# Patient Record
Sex: Female | Born: 1935 | Race: White | Hispanic: No | Smoking: Never smoker
Health system: Southern US, Community
[De-identification: ages and names within clinical notes are randomized; demographics above are authoritative.]

## PROBLEM LIST (undated history)

## (undated) DIAGNOSIS — R112 Nausea with vomiting, unspecified: Secondary | ICD-10-CM

## (undated) DIAGNOSIS — I471 Supraventricular tachycardia, unspecified: Secondary | ICD-10-CM

## (undated) DIAGNOSIS — E7800 Pure hypercholesterolemia, unspecified: Secondary | ICD-10-CM

## (undated) DIAGNOSIS — M545 Low back pain, unspecified: Secondary | ICD-10-CM

## (undated) DIAGNOSIS — K5731 Diverticulosis of large intestine without perforation or abscess with bleeding: Secondary | ICD-10-CM

## (undated) DIAGNOSIS — I10 Essential (primary) hypertension: Secondary | ICD-10-CM

## (undated) DIAGNOSIS — M5137 Other intervertebral disc degeneration, lumbosacral region: Secondary | ICD-10-CM

## (undated) DIAGNOSIS — G9389 Other specified disorders of brain: Secondary | ICD-10-CM

## (undated) DIAGNOSIS — I499 Cardiac arrhythmia, unspecified: Secondary | ICD-10-CM

## (undated) DIAGNOSIS — O021 Missed abortion: Secondary | ICD-10-CM

## (undated) DIAGNOSIS — Z9889 Other specified postprocedural states: Secondary | ICD-10-CM

## (undated) DIAGNOSIS — M51379 Other intervertebral disc degeneration, lumbosacral region without mention of lumbar back pain or lower extremity pain: Secondary | ICD-10-CM

## (undated) DIAGNOSIS — K5900 Constipation, unspecified: Secondary | ICD-10-CM

## (undated) DIAGNOSIS — H409 Unspecified glaucoma: Secondary | ICD-10-CM

## (undated) DIAGNOSIS — R197 Diarrhea, unspecified: Secondary | ICD-10-CM

## (undated) DIAGNOSIS — R51 Headache: Secondary | ICD-10-CM

## (undated) DIAGNOSIS — C801 Malignant (primary) neoplasm, unspecified: Secondary | ICD-10-CM

## (undated) DIAGNOSIS — R519 Headache, unspecified: Secondary | ICD-10-CM

## (undated) DIAGNOSIS — N393 Stress incontinence (female) (male): Secondary | ICD-10-CM

## (undated) DIAGNOSIS — M1990 Unspecified osteoarthritis, unspecified site: Secondary | ICD-10-CM

## (undated) HISTORY — DX: Supraventricular tachycardia, unspecified: I47.10

## (undated) HISTORY — PX: EYE SURGERY: SHX253

## (undated) HISTORY — DX: Low back pain, unspecified: M54.50

## (undated) HISTORY — PX: MULTIPLE TOOTH EXTRACTIONS: SHX2053

## (undated) HISTORY — DX: Unspecified glaucoma: H40.9

## (undated) HISTORY — PX: APPENDECTOMY: SHX54

## (undated) HISTORY — DX: Other intervertebral disc degeneration, lumbosacral region without mention of lumbar back pain or lower extremity pain: M51.379

## (undated) HISTORY — DX: Pure hypercholesterolemia, unspecified: E78.00

## (undated) HISTORY — PX: OTHER SURGICAL HISTORY: SHX169

## (undated) HISTORY — DX: Diverticulosis of large intestine without perforation or abscess with bleeding: K57.31

## (undated) HISTORY — PX: TUBAL LIGATION: SHX77

## (undated) HISTORY — DX: Stress incontinence (female) (male): N39.3

## (undated) HISTORY — DX: Low back pain: M54.5

## (undated) HISTORY — DX: Supraventricular tachycardia: I47.1

## (undated) HISTORY — DX: Unspecified osteoarthritis, unspecified site: M19.90

## (undated) HISTORY — PX: ABDOMINAL HYSTERECTOMY: SHX81

## (undated) HISTORY — DX: Other intervertebral disc degeneration, lumbosacral region: M51.37

## (undated) HISTORY — PX: COLONOSCOPY: SHX174

## (undated) HISTORY — DX: Essential (primary) hypertension: I10

## (undated) SURGERY — Surgical Case
Anesthesia: *Unknown

---

## 1999-06-10 ENCOUNTER — Encounter: Admission: RE | Admit: 1999-06-10 | Discharge: 1999-06-10 | Payer: Self-pay | Admitting: Internal Medicine

## 1999-06-10 ENCOUNTER — Encounter: Payer: Self-pay | Admitting: Internal Medicine

## 1999-11-27 ENCOUNTER — Encounter: Payer: Self-pay | Admitting: Internal Medicine

## 1999-11-27 ENCOUNTER — Inpatient Hospital Stay (HOSPITAL_COMMUNITY): Admission: AD | Admit: 1999-11-27 | Discharge: 1999-11-30 | Payer: Self-pay | Admitting: Internal Medicine

## 2004-05-07 ENCOUNTER — Ambulatory Visit: Payer: Self-pay | Admitting: Internal Medicine

## 2004-05-12 ENCOUNTER — Encounter: Admission: RE | Admit: 2004-05-12 | Discharge: 2004-05-12 | Payer: Self-pay | Admitting: Internal Medicine

## 2004-06-16 ENCOUNTER — Ambulatory Visit: Payer: Self-pay | Admitting: Internal Medicine

## 2004-07-01 ENCOUNTER — Ambulatory Visit: Payer: Self-pay | Admitting: Internal Medicine

## 2005-05-27 ENCOUNTER — Ambulatory Visit: Payer: Self-pay | Admitting: Internal Medicine

## 2005-05-28 ENCOUNTER — Ambulatory Visit: Payer: Self-pay | Admitting: Internal Medicine

## 2005-06-10 ENCOUNTER — Ambulatory Visit: Payer: Self-pay | Admitting: Internal Medicine

## 2005-11-19 ENCOUNTER — Ambulatory Visit: Payer: Self-pay | Admitting: Internal Medicine

## 2005-11-25 ENCOUNTER — Ambulatory Visit: Payer: Self-pay | Admitting: Internal Medicine

## 2005-12-01 ENCOUNTER — Ambulatory Visit: Payer: Self-pay | Admitting: Family Medicine

## 2005-12-01 DIAGNOSIS — K5731 Diverticulosis of large intestine without perforation or abscess with bleeding: Secondary | ICD-10-CM | POA: Insufficient documentation

## 2005-12-02 ENCOUNTER — Ambulatory Visit: Payer: Self-pay | Admitting: Family Medicine

## 2005-12-03 ENCOUNTER — Ambulatory Visit: Payer: Self-pay | Admitting: Family Medicine

## 2005-12-07 ENCOUNTER — Ambulatory Visit: Payer: Self-pay | Admitting: Family Medicine

## 2005-12-15 ENCOUNTER — Ambulatory Visit: Payer: Self-pay | Admitting: Internal Medicine

## 2006-01-06 ENCOUNTER — Ambulatory Visit: Payer: Self-pay | Admitting: Family Medicine

## 2006-04-21 DIAGNOSIS — N393 Stress incontinence (female) (male): Secondary | ICD-10-CM | POA: Insufficient documentation

## 2006-04-21 DIAGNOSIS — M545 Low back pain, unspecified: Secondary | ICD-10-CM | POA: Insufficient documentation

## 2006-04-21 DIAGNOSIS — H409 Unspecified glaucoma: Secondary | ICD-10-CM | POA: Insufficient documentation

## 2006-04-21 DIAGNOSIS — Z8711 Personal history of peptic ulcer disease: Secondary | ICD-10-CM

## 2006-04-21 DIAGNOSIS — I471 Supraventricular tachycardia, unspecified: Secondary | ICD-10-CM | POA: Insufficient documentation

## 2006-04-21 DIAGNOSIS — M51379 Other intervertebral disc degeneration, lumbosacral region without mention of lumbar back pain or lower extremity pain: Secondary | ICD-10-CM | POA: Insufficient documentation

## 2006-04-21 DIAGNOSIS — M5137 Other intervertebral disc degeneration, lumbosacral region: Secondary | ICD-10-CM

## 2006-04-27 ENCOUNTER — Ambulatory Visit: Payer: Self-pay | Admitting: Family Medicine

## 2006-04-29 ENCOUNTER — Ambulatory Visit: Payer: Self-pay | Admitting: *Deleted

## 2006-05-07 ENCOUNTER — Ambulatory Visit: Payer: Self-pay | Admitting: Family Medicine

## 2006-05-07 LAB — CONVERTED CEMR LAB
Albumin: 3.8 g/dL (ref 3.5–5.2)
Alkaline Phosphatase: 54 units/L (ref 39–117)
Direct LDL: 156.7 mg/dL
HDL: 39.7 mg/dL (ref >39.0)
Total CHOL/HDL Ratio: 5.7
Triglycerides: 164 mg/dL — ABNORMAL HIGH (ref 0–149)
VLDL: 33 mg/dL (ref 0–40)

## 2006-08-20 ENCOUNTER — Ambulatory Visit: Payer: Self-pay | Admitting: Family Medicine

## 2006-08-20 DIAGNOSIS — E7800 Pure hypercholesterolemia, unspecified: Secondary | ICD-10-CM | POA: Insufficient documentation

## 2006-09-01 LAB — CONVERTED CEMR LAB
AST: 22 units/L (ref 0–37)
Cholesterol: 155 mg/dL (ref 0–200)
HDL: 45.3 mg/dL (ref >39.0)
LDL Cholesterol: 87 mg/dL (ref 0–99)
Total CHOL/HDL Ratio: 3.4
Triglycerides: 113 mg/dL (ref 0–149)

## 2007-01-14 ENCOUNTER — Ambulatory Visit: Payer: Self-pay | Admitting: Family Medicine

## 2007-03-09 ENCOUNTER — Ambulatory Visit: Payer: Self-pay | Admitting: Family Medicine

## 2007-03-15 LAB — CONVERTED CEMR LAB
Albumin: 3.9 g/dL (ref 3.5–5.2)
Alkaline Phosphatase: 57 units/L (ref 39–117)
LDL Cholesterol: 109 mg/dL — ABNORMAL HIGH (ref 0–99)
Total Bilirubin: 0.9 mg/dL (ref 0.3–1.2)
Total CHOL/HDL Ratio: 4.1
Triglycerides: 136 mg/dL (ref 0–149)

## 2007-04-06 ENCOUNTER — Telehealth: Payer: Self-pay | Admitting: Family Medicine

## 2007-05-26 ENCOUNTER — Ambulatory Visit: Payer: Self-pay | Admitting: Family Medicine

## 2007-05-26 DIAGNOSIS — Z8719 Personal history of other diseases of the digestive system: Secondary | ICD-10-CM

## 2007-09-13 ENCOUNTER — Ambulatory Visit: Payer: Self-pay | Admitting: Family Medicine

## 2007-09-15 LAB — CONVERTED CEMR LAB
ALT: 17 units/L (ref 0–35)
AST: 19 units/L (ref 0–37)
Albumin: 4.1 g/dL (ref 3.5–5.2)
BUN: 12 mg/dL (ref 6–23)
CO2: 29 meq/L (ref 19–32)
Calcium: 9.3 mg/dL (ref 8.4–10.5)
Chloride: 105 meq/L (ref 96–112)
Cholesterol: 159 mg/dL (ref 0–200)
Creatinine, Ser: 0.8 mg/dL (ref 0.4–1.2)
GFR calc non Af Amer: 75 mL/min
HDL: 42 mg/dL (ref >39.0)
LDL Cholesterol: 99 mg/dL (ref 0–99)
Triglycerides: 92 mg/dL (ref 0–149)

## 2007-12-15 ENCOUNTER — Encounter (INDEPENDENT_AMBULATORY_CARE_PROVIDER_SITE_OTHER): Payer: Self-pay | Admitting: *Deleted

## 2008-04-16 ENCOUNTER — Encounter (INDEPENDENT_AMBULATORY_CARE_PROVIDER_SITE_OTHER): Payer: Self-pay | Admitting: *Deleted

## 2008-05-25 IMAGING — CT CT ABDOMEN W/ CM
2 of 5 series · 16 of 46 positions shown, 18 images · IV contrast (omnipaque)
Comparison: none

CLINICAL DATA: Intermittent left upper quadrant pain.  Hysterectomy and appendectomy. 
ABDOMEN CT WITH CONTRAST:
TECHNIQUE: Multidetector CT imaging of the abdomen was performed following the standard protocol during bolus administration of intravenous contrast.
Contrast:  125 cc Omnipaque 300
TECHNIQUE: Multidetector CT imaging of the pelvis was performed following the standard protocol during bolus administration of intravenous contrast.

[Series 3: abd_pel_xxl 5.0 b10f st · axial · 0.85mm/px · z∈[-410,-26]mm · 13 of 87 slices shown, 15 images]
[im 5/87  soft-tissue]
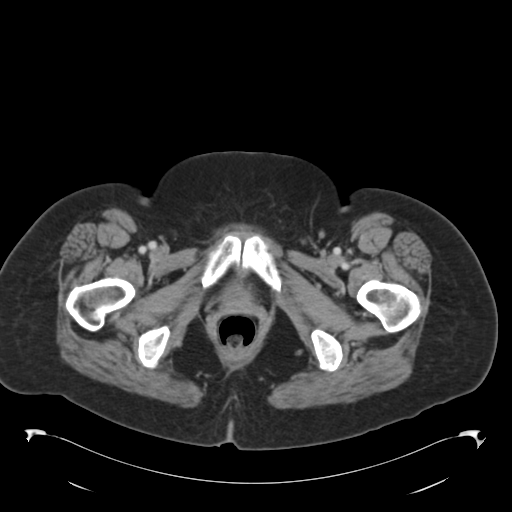
[im 5/87  bone]
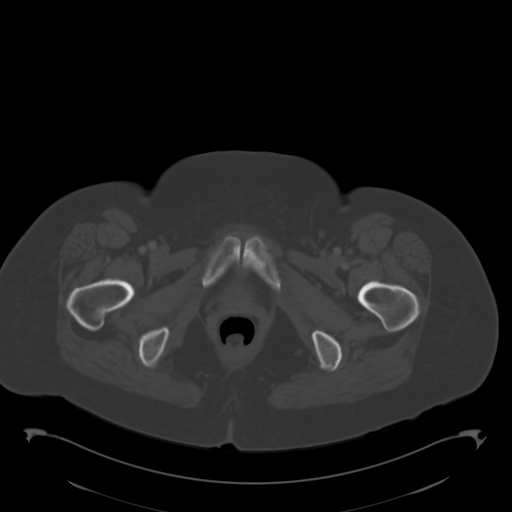
[im 10/87  soft-tissue]
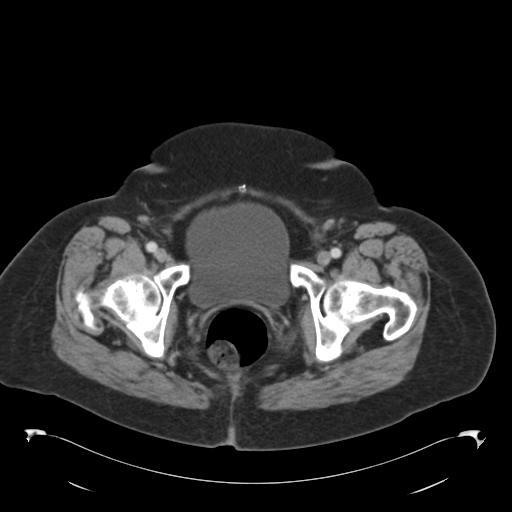
[im 20/87  soft-tissue]
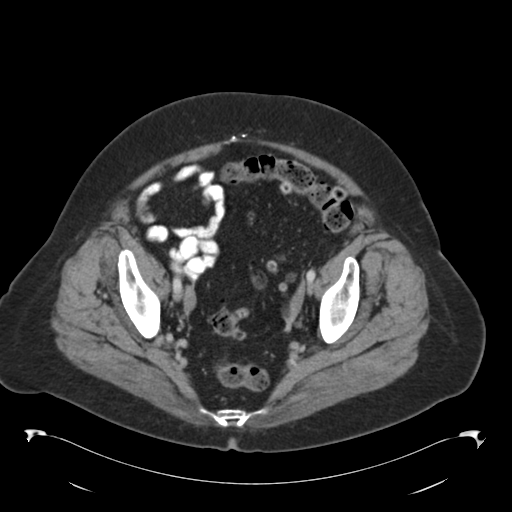
[im 24/87  soft-tissue]
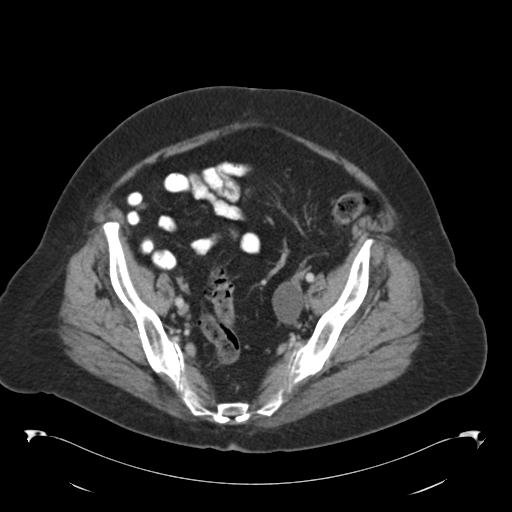
[im 29/87  soft-tissue]
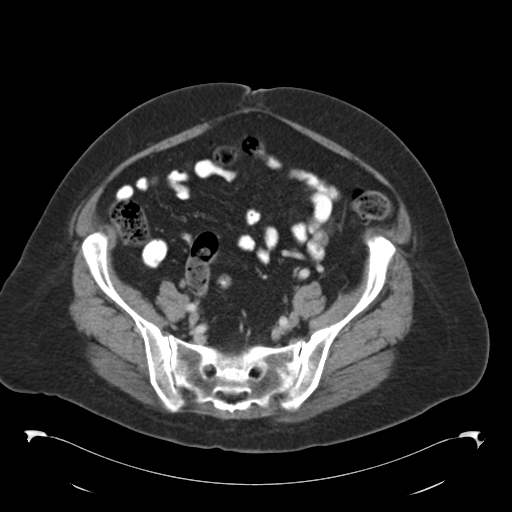
[im 39/87  soft-tissue]
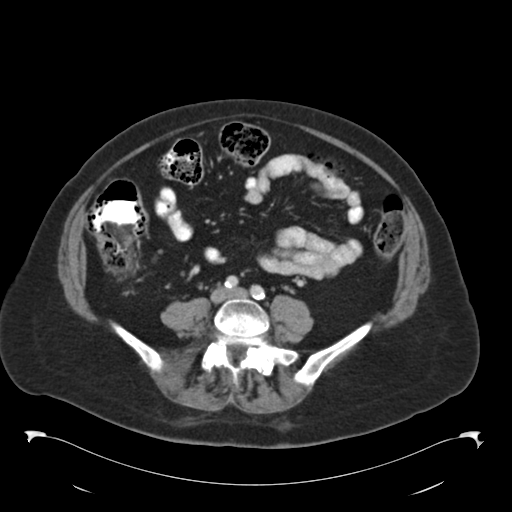
[im 44/87  soft-tissue]
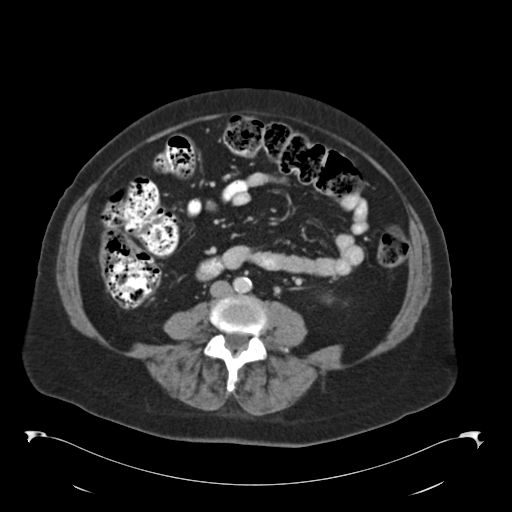
[im 48/87  soft-tissue]
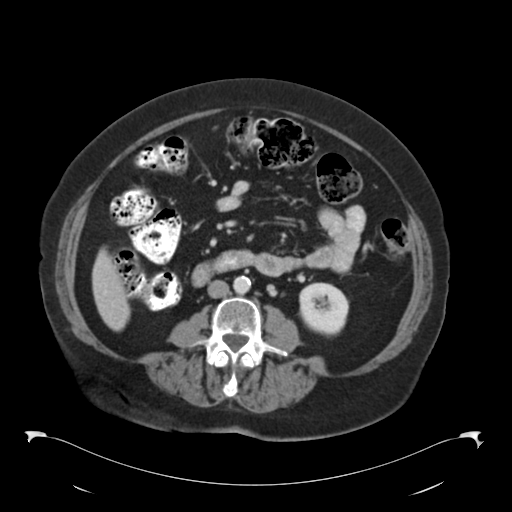
[im 58/87  soft-tissue]
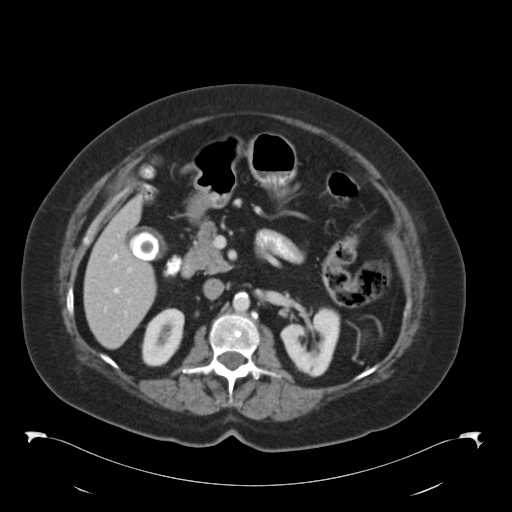
[im 58/87  bone]
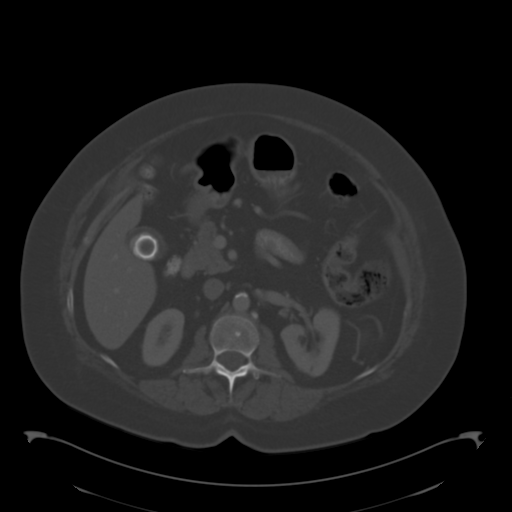
[im 63/87  soft-tissue]
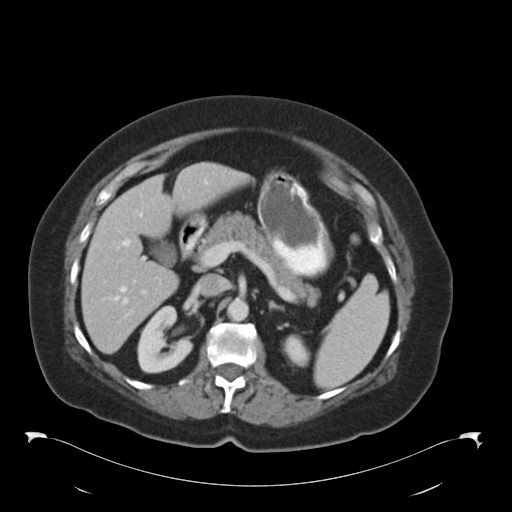
[im 67/87  soft-tissue]
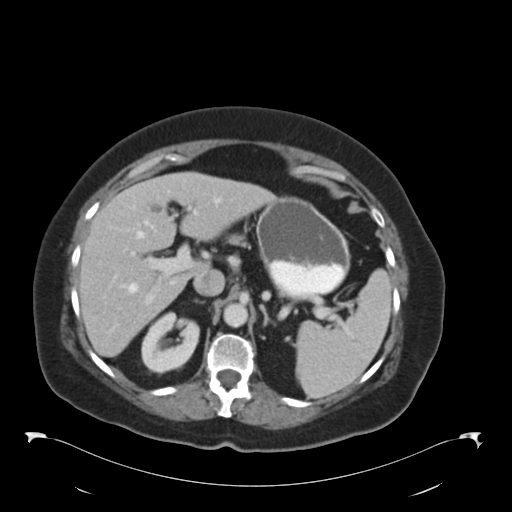
[im 77/87  soft-tissue]
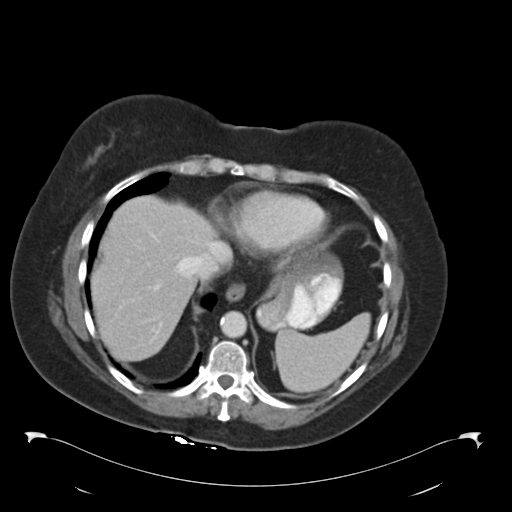
[im 82/87  soft-tissue]
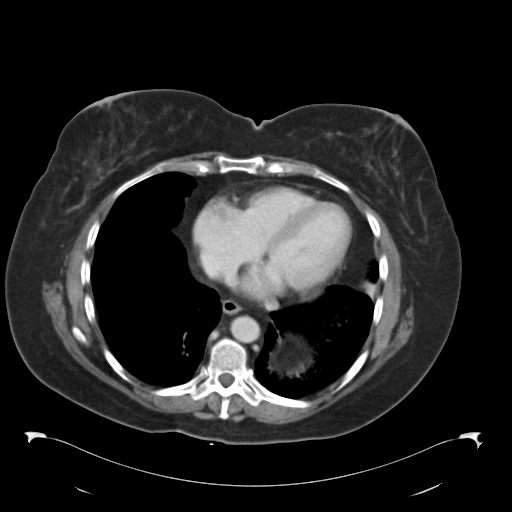

[Series 602: <mpr thick range> · coronal · 0.89mm/px · 3 of 90 slices shown]
[im 30/90  soft-tissue]
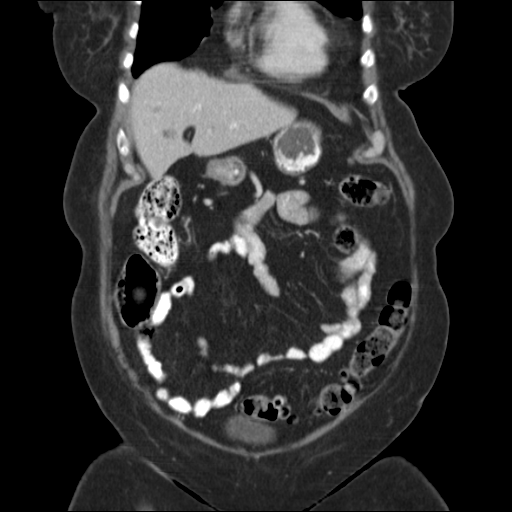
[im 40/90  soft-tissue]
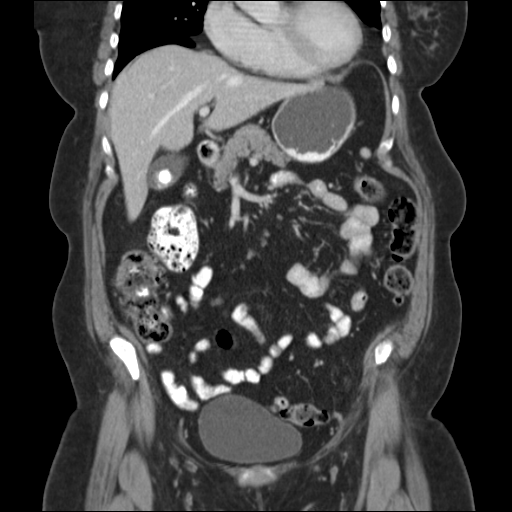
[im 50/90  soft-tissue]
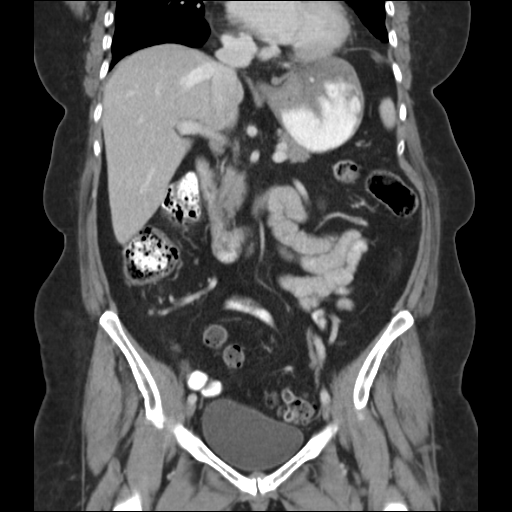

[16 of 46 positions shown; findings below may reference images not displayed]

FINDINGS: Large gallstones measuring approximately 2.5 cm in diameter.  There is an approximate 9 mm low density lesion in the medial aspect of the left hepatic lobe (see image 15 which becomes nearly imperceptible on the delayed images.  It is compatible with a small cavernous hemangioma.  Also findings compatible with an approximate 9 mm cavernous hemangioma near the juncture of the right and left hepatic lobes (see image 18).  1.8 cm cyst in the medial segment of the left hepatic lobe (see image #23).  There is an approximate 9 mm cavernous hemangioma in the posterior segment of the right hepatic lobe (see image #23).  Normal pancreas, spleen, adrenal glands, and kidneys with the exception of a minute lipoma or angiomyolipoma in the posterior aspect of the mid left kidney (see image 36).  Atherosclerotic changes abdominal aorta.  Normal caliber of the abdominal aorta and iliac arteries.  Small umbilical hernia containing intraabdominal fat.Generous amount of stool throughout the colon.
IMPRESSION: Multiple small hepatic cavernous hemangiomas.  Moderately small hepatic cyst.  Minimal left renal angiomyolipoma versus lipoma.  Large gallstone.  No biliary ductal dilatation.  Incidentally there are a few diverticula in the descending colon. 
PELVIS CT WITH CONTRAST:
FINDINGS: Extensive sigmoid colon diverticulosis.  Negative for diverticulitis.  Hysterectomy.  Bladder unremarkable.  2.8 x 3.9 cm left ovarian cyst.  Grade I anterolisthesis L-4 on L-5 by approximately 7-8 mm.  Advanced DDD L4-5.
IMPRESSION: Left ovarian cyst.  Extensive sigmoid diverticular disease.

## 2008-09-05 ENCOUNTER — Ambulatory Visit: Payer: Self-pay | Admitting: Family Medicine

## 2008-09-05 DIAGNOSIS — Z85820 Personal history of malignant melanoma of skin: Secondary | ICD-10-CM

## 2008-09-05 DIAGNOSIS — I10 Essential (primary) hypertension: Secondary | ICD-10-CM

## 2008-09-10 ENCOUNTER — Ambulatory Visit: Payer: Self-pay | Admitting: Family Medicine

## 2008-09-11 LAB — CONVERTED CEMR LAB
AST: 19 units/L (ref 0–37)
Albumin: 4 g/dL (ref 3.5–5.2)
BUN: 17 mg/dL (ref 6–23)
Basophils Absolute: 0 10*3/uL (ref 0.0–0.1)
Chloride: 108 meq/L (ref 96–112)
Cholesterol: 160 mg/dL (ref 0–200)
Eosinophils Absolute: 0.2 10*3/uL (ref 0.0–0.7)
Glucose, Bld: 95 mg/dL (ref 70–99)
HCT: 35.5 % — ABNORMAL LOW (ref 36.0–46.0)
Lymphs Abs: 0.9 10*3/uL (ref 0.7–4.0)
MCHC: 34.8 g/dL (ref 30.0–36.0)
MCV: 112.3 fL — ABNORMAL HIGH (ref 78.0–100.0)
Monocytes Absolute: 0.2 10*3/uL (ref 0.1–1.0)
Platelets: 137 10*3/uL — ABNORMAL LOW (ref 150.0–400.0)
Potassium: 4.3 meq/L (ref 3.5–5.1)
RDW: 16.3 % — ABNORMAL HIGH (ref 11.5–14.6)
TSH: 2.45 microintl units/mL (ref 0.35–5.50)
Triglycerides: 168 mg/dL — ABNORMAL HIGH (ref 0.0–149.0)

## 2008-09-18 ENCOUNTER — Ambulatory Visit: Payer: Self-pay | Admitting: Family Medicine

## 2008-09-18 DIAGNOSIS — D696 Thrombocytopenia, unspecified: Secondary | ICD-10-CM

## 2008-10-03 ENCOUNTER — Ambulatory Visit: Payer: Self-pay | Admitting: Family Medicine

## 2008-10-10 ENCOUNTER — Encounter: Admission: RE | Admit: 2008-10-10 | Discharge: 2008-10-10 | Payer: Self-pay | Admitting: Family Medicine

## 2008-10-10 ENCOUNTER — Encounter: Payer: Self-pay | Admitting: Family Medicine

## 2008-10-16 DIAGNOSIS — M899 Disorder of bone, unspecified: Secondary | ICD-10-CM | POA: Insufficient documentation

## 2008-10-16 DIAGNOSIS — M949 Disorder of cartilage, unspecified: Secondary | ICD-10-CM

## 2008-10-17 ENCOUNTER — Telehealth: Payer: Self-pay | Admitting: Family Medicine

## 2008-10-23 ENCOUNTER — Ambulatory Visit: Payer: Self-pay | Admitting: Family Medicine

## 2008-10-26 DIAGNOSIS — D72819 Decreased white blood cell count, unspecified: Secondary | ICD-10-CM | POA: Insufficient documentation

## 2008-10-26 LAB — CONVERTED CEMR LAB
Basophils Absolute: 0 10*3/uL (ref 0.0–0.1)
Hemoglobin: 12.8 g/dL (ref 12.0–15.0)
Lymphocytes Relative: 29.5 % (ref 12.0–46.0)
Monocytes Relative: 4.9 % (ref 3.0–12.0)
Neutro Abs: 1.9 10*3/uL (ref 1.4–7.7)
RBC: 3.15 M/uL — ABNORMAL LOW (ref 3.87–5.11)
RDW: 15.2 % — ABNORMAL HIGH (ref 11.5–14.6)

## 2008-10-29 ENCOUNTER — Ambulatory Visit: Payer: Self-pay | Admitting: Oncology

## 2008-10-31 ENCOUNTER — Encounter: Payer: Self-pay | Admitting: Family Medicine

## 2008-10-31 LAB — CMP (CANCER CENTER ONLY)
ALT(SGPT): 22 U/L (ref 10–47)
AST: 23 U/L (ref 11–38)
Albumin: 4 g/dL (ref 3.3–5.5)
CO2: 30 mEq/L (ref 18–33)
Calcium: 9.3 mg/dL (ref 8.0–10.3)
Chloride: 103 mEq/L (ref 98–108)
Potassium: 4.2 mEq/L (ref 3.3–4.7)
Total Protein: 7 g/dL (ref 6.4–8.1)

## 2008-10-31 LAB — CBC WITH DIFFERENTIAL (CANCER CENTER ONLY)
BASO%: 0.2 % (ref 0.0–2.0)
EOS%: 4.9 % (ref 0.0–7.0)
HCT: 38 % (ref 34.8–46.6)
LYMPH#: 1 10*3/uL (ref 0.9–3.3)
MCHC: 34.1 g/dL (ref 32.0–36.0)
MONO#: 0.2 10*3/uL (ref 0.1–0.9)
NEUT#: 2.3 10*3/uL (ref 1.5–6.5)
NEUT%: 62.8 % (ref 39.6–80.0)
Platelets: 179 10*3/uL (ref 145–400)
RDW: 12.7 % (ref 10.5–14.6)
WBC: 3.7 10*3/uL — ABNORMAL LOW (ref 3.9–10.0)

## 2008-11-02 LAB — VITAMIN B12: Vitamin B-12: 95 pg/mL — ABNORMAL LOW (ref 211–911)

## 2008-11-02 LAB — ERYTHROPOIETIN: Erythropoietin: 22 m[IU]/mL (ref 2.6–34.0)

## 2008-11-02 LAB — FOLATE: Folate: >20 ng/mL

## 2008-11-02 LAB — PROTEIN ELECTROPHORESIS, SERUM
Alpha-2-Globulin: 7.3 % (ref 7.1–11.8)
Beta 2: 2.8 % — ABNORMAL LOW (ref 3.2–6.5)
Gamma Globulin: 12.7 % (ref 11.1–18.8)

## 2008-11-02 LAB — IRON AND TIBC: TIBC: 298 ug/dL (ref 250–470)

## 2008-11-02 LAB — RETICULOCYTES (CHCC)
ABS Retic: 40.7 10*3/uL (ref 19.0–186.0)
RBC.: 3.39 MIL/uL — ABNORMAL LOW (ref 3.87–5.11)

## 2008-11-02 LAB — FERRITIN: Ferritin: 73 ng/mL (ref 10–291)

## 2008-11-09 ENCOUNTER — Encounter: Payer: Self-pay | Admitting: Family Medicine

## 2008-11-09 LAB — CBC WITH DIFFERENTIAL (CANCER CENTER ONLY)
BASO#: 0 10*3/uL (ref 0.0–0.2)
EOS%: 3.7 % (ref 0.0–7.0)
HCT: 36.6 % (ref 34.8–46.6)
HGB: 12.7 g/dL (ref 11.6–15.9)
LYMPH#: 1 10*3/uL (ref 0.9–3.3)
MCHC: 34.6 g/dL (ref 32.0–36.0)
MONO#: 0.2 10*3/uL (ref 0.1–0.9)
NEUT#: 2.1 10*3/uL (ref 1.5–6.5)
NEUT%: 62.1 % (ref 39.6–80.0)
RBC: 3.21 10*6/uL — ABNORMAL LOW (ref 3.70–5.32)
WBC: 3.4 10*3/uL — ABNORMAL LOW (ref 3.9–10.0)

## 2008-12-06 ENCOUNTER — Ambulatory Visit: Payer: Self-pay | Admitting: Oncology

## 2009-01-07 ENCOUNTER — Ambulatory Visit: Payer: Self-pay | Admitting: Oncology

## 2009-01-10 ENCOUNTER — Encounter: Payer: Self-pay | Admitting: Family Medicine

## 2009-01-10 LAB — CBC WITH DIFFERENTIAL (CANCER CENTER ONLY)
BASO%: 0.3 % (ref 0.0–2.0)
EOS%: 4.6 % (ref 0.0–7.0)
LYMPH%: 22.7 % (ref 14.0–48.0)
MCHC: 33.3 g/dL (ref 32.0–36.0)
MCV: 95 fL (ref 81–101)
MONO#: 0.2 10*3/uL (ref 0.1–0.9)
NEUT%: 66.7 % (ref 39.6–80.0)
Platelets: 183 10*3/uL (ref 145–400)
RDW: 12 % (ref 10.5–14.6)
WBC: 4.3 10*3/uL (ref 3.9–10.0)

## 2009-01-10 LAB — VITAMIN B12: Vitamin B-12: 154 pg/mL — ABNORMAL LOW (ref 211–911)

## 2009-02-05 ENCOUNTER — Ambulatory Visit: Payer: Self-pay | Admitting: Oncology

## 2009-03-06 ENCOUNTER — Ambulatory Visit: Payer: Self-pay | Admitting: Oncology

## 2009-04-03 ENCOUNTER — Ambulatory Visit: Payer: Self-pay | Admitting: Oncology

## 2009-04-30 ENCOUNTER — Ambulatory Visit: Payer: Self-pay | Admitting: Oncology

## 2009-05-29 ENCOUNTER — Ambulatory Visit: Payer: Self-pay | Admitting: Oncology

## 2009-06-04 ENCOUNTER — Encounter: Payer: Self-pay | Admitting: Family Medicine

## 2009-06-04 LAB — CMP (CANCER CENTER ONLY)
ALT(SGPT): 54 U/L — ABNORMAL HIGH (ref 10–47)
AST: 36 U/L (ref 11–38)
Albumin: 3.6 g/dL (ref 3.3–5.5)
Alkaline Phosphatase: 101 U/L — ABNORMAL HIGH (ref 26–84)
Potassium: 4.2 mEq/L (ref 3.3–4.7)
Sodium: 140 mEq/L (ref 128–145)
Total Bilirubin: 0.5 mg/dl (ref 0.20–1.60)
Total Protein: 7 g/dL (ref 6.4–8.1)

## 2009-06-04 LAB — CBC WITH DIFFERENTIAL (CANCER CENTER ONLY)
BASO%: 0.4 % (ref 0.0–2.0)
EOS%: 2.3 % (ref 0.0–7.0)
Eosinophils Absolute: 0.1 10*3/uL (ref 0.0–0.5)
LYMPH%: 26 % (ref 14.0–48.0)
MCH: 28.7 pg (ref 26.0–34.0)
MCHC: 34.7 g/dL (ref 32.0–36.0)
MONO%: 5.3 % (ref 0.0–13.0)
NEUT#: 2.3 10*3/uL (ref 1.5–6.5)
Platelets: 245 10*3/uL (ref 145–400)
RBC: 4.54 10*6/uL (ref 3.70–5.32)
RDW: 11.6 % (ref 10.5–14.6)

## 2009-07-05 ENCOUNTER — Ambulatory Visit: Payer: Self-pay | Admitting: Family Medicine

## 2009-07-05 DIAGNOSIS — E538 Deficiency of other specified B group vitamins: Secondary | ICD-10-CM

## 2009-07-19 ENCOUNTER — Ambulatory Visit: Payer: Self-pay | Admitting: Family Medicine

## 2009-08-01 ENCOUNTER — Ambulatory Visit: Payer: Self-pay | Admitting: Oncology

## 2009-08-02 ENCOUNTER — Encounter (INDEPENDENT_AMBULATORY_CARE_PROVIDER_SITE_OTHER): Payer: Self-pay | Admitting: *Deleted

## 2009-09-23 ENCOUNTER — Telehealth: Payer: Self-pay | Admitting: Family Medicine

## 2009-09-24 ENCOUNTER — Ambulatory Visit: Payer: Self-pay | Admitting: Family Medicine

## 2009-09-24 DIAGNOSIS — E559 Vitamin D deficiency, unspecified: Secondary | ICD-10-CM

## 2009-09-25 ENCOUNTER — Ambulatory Visit: Payer: Self-pay | Admitting: Oncology

## 2009-09-27 LAB — CONVERTED CEMR LAB
Albumin: 4.6 g/dL (ref 3.5–5.2)
BUN: 19 mg/dL (ref 6–23)
Calcium: 9.3 mg/dL (ref 8.4–10.5)
Creatinine, Ser: 0.7 mg/dL (ref 0.4–1.2)
Direct LDL: 160.7 mg/dL
GFR calc non Af Amer: 87.14 mL/min (ref >60)
Glucose, Bld: 101 mg/dL — ABNORMAL HIGH (ref 70–99)
HDL: 50.6 mg/dL (ref >39.00)
Sodium: 140 meq/L (ref 135–145)
TSH: 3.74 microintl units/mL (ref 0.35–5.50)
Triglycerides: 152 mg/dL — ABNORMAL HIGH (ref 0.0–149.0)
Vit D, 25-Hydroxy: 24 ng/mL — ABNORMAL LOW (ref 30–89)

## 2009-10-02 ENCOUNTER — Ambulatory Visit: Payer: Self-pay | Admitting: Family Medicine

## 2009-10-02 DIAGNOSIS — R3915 Urgency of urination: Secondary | ICD-10-CM

## 2009-10-02 LAB — CONVERTED CEMR LAB
Cholesterol, target level: 200 mg/dL
Protein, U semiquant: NEGATIVE
Urobilinogen, UA: 0.2

## 2009-10-10 ENCOUNTER — Encounter: Admission: RE | Admit: 2009-10-10 | Discharge: 2009-10-28 | Payer: Self-pay | Admitting: Family Medicine

## 2009-10-17 ENCOUNTER — Encounter: Payer: Self-pay | Admitting: Family Medicine

## 2009-10-28 ENCOUNTER — Encounter: Payer: Self-pay | Admitting: Family Medicine

## 2009-11-27 ENCOUNTER — Ambulatory Visit: Payer: Self-pay | Admitting: Oncology

## 2009-12-19 ENCOUNTER — Encounter: Payer: Self-pay | Admitting: Family Medicine

## 2010-02-07 ENCOUNTER — Ambulatory Visit: Payer: Self-pay | Admitting: Oncology

## 2010-02-11 ENCOUNTER — Encounter: Payer: Self-pay | Admitting: Family Medicine

## 2010-02-11 LAB — CBC WITH DIFFERENTIAL/PLATELET
BASO%: 0.8 % (ref 0.0–2.0)
HCT: 43.5 % (ref 34.8–46.6)
LYMPH%: 22.3 % (ref 14.0–49.7)
MCHC: 33.6 g/dL (ref 31.5–36.0)
MCV: 89.7 fL (ref 79.5–101.0)
MONO#: 0.2 10*3/uL (ref 0.1–0.9)
MONO%: 4.8 % (ref 0.0–14.0)
NEUT%: 70.2 % (ref 38.4–76.8)
Platelets: 214 10*3/uL (ref 145–400)
RBC: 4.85 10*6/uL (ref 3.70–5.45)

## 2010-02-11 LAB — VITAMIN B12: Vitamin B-12: 175 pg/mL — ABNORMAL LOW (ref 211–911)

## 2010-02-12 ENCOUNTER — Telehealth: Payer: Self-pay | Admitting: Family Medicine

## 2010-02-14 ENCOUNTER — Ambulatory Visit: Payer: Self-pay | Admitting: Family Medicine

## 2010-02-14 LAB — CONVERTED CEMR LAB
ALT: 13 units/L (ref 0–35)
AST: 15 units/L (ref 0–37)
HDL: 44.5 mg/dL (ref >39.00)
LDL Cholesterol: 111 mg/dL — ABNORMAL HIGH (ref 0–99)
VLDL: 24.8 mg/dL (ref 0.0–40.0)

## 2010-02-26 ENCOUNTER — Telehealth (INDEPENDENT_AMBULATORY_CARE_PROVIDER_SITE_OTHER): Payer: Self-pay | Admitting: *Deleted

## 2010-03-04 ENCOUNTER — Ambulatory Visit
Admission: RE | Admit: 2010-03-04 | Discharge: 2010-03-04 | Payer: Self-pay | Source: Home / Self Care | Attending: Family Medicine | Admitting: Family Medicine

## 2010-03-04 DIAGNOSIS — M25519 Pain in unspecified shoulder: Secondary | ICD-10-CM | POA: Insufficient documentation

## 2010-03-24 ENCOUNTER — Encounter: Payer: Self-pay | Admitting: Family Medicine

## 2010-04-01 NOTE — Assessment & Plan Note (Signed)
Summary: leg and hip pain   Vital Signs:  Patient profile:   76 year old female Height:      64 inches Weight:      190.38 pounds BMI:     32.80 Temp:     98.6 degrees F oral Pulse rate:   68 / minute Pulse rhythm:   regular BP sitting:   140 / 90  (left arm) Cuff size:   regular  Vitals Entered By: Linde Gillis CMA Duncan Dull) (Jul 19, 2009 11:27 AM) CC: leg and hip pain   History of Present Illness: 76 yo with h/o degenerative disc disease and osteopenia presents with right leg and hip pain.  Woke up with acute pain yesterday morning. No known trauma or injury. Pain is in right lower back, radiates down her entire leg to her toes. Mainly a sharp, stabbing pain, sometimes burns. No LE weakness. No urinary symptoms.  Taking Cyclobenzaprine 10 mg three times a day which helps but pain returns as soon as it wears off. Pain is worse when sitting or walking. Most relief when standing in same position.  Current Medications (verified): 1)  Pravachol 80 Mg  Tabs (Pravastatin Sodium) .... Take 1 Tablet By Mouth Once A Day 2)  Verapamil Hcl Cr 240 Mg Tbcr (Verapamil Hcl) .... Take 1 Tablet By Mouth Once A Day 3)  Lisinopril 20 Mg Tabs (Lisinopril) .... Take 1 Tablet By Mouth Once A Day 4)  Azopt 1 %  Susp (Brinzolamide) .... Use 1 Drop in Each Eye Twice A Day 5)  0.5 % Trimcinolone/eucerine 1:1 Compounded .... Aa Two Times A Day X 2 Weeks, Then As Needed 6)  Metronidazole 0.75 % Gel (Metronidazole) .... Apply To Fece Two Times A Day 7)  Travatan Z 0.004 % Soln (Travoprost) .... Use One Drop in Each Eye Everyday At Bedtime 8)  Cyclobenzaprine Hcl 10 Mg Tabs (Cyclobenzaprine Hcl) .... Take One Tablet By Mouth Once Daily For Muscle Spasms 9)  Skelaxin 800 Mg Tabs (Metaxalone) .Marland Kitchen.. 1 Tab By Mouth Three Times A Day As Needed Pain 10)  Meloxicam 15 Mg Tabs (Meloxicam) .... One By Mouth Daily As Needed Pain  Allergies: 1)  Codeine Phosphate (Codeine Phosphate)  Review of Systems      See  HPI General:  Denies chills and fever. GU:  Denies incontinence. MS:  Complains of low back pain and stiffness; denies loss of strength.  Physical Exam  General:  overweight female in NAD Msk:  Tight lumbar paraspinous muslce. Pos SLR right. Neg fabers. Normal strength. Neurologic:  No cranial nerve deficits noted. Station and gait are normal. Plantar reflexes are down-going bilaterally. DTRs are symmetrical throughout. Sensory, motor and coordinative functions appear intact. Psych:  Cognition and judgment appear intact. Alert and cooperative with normal attention span and concentration. No apparent delusions, illusions, hallucinations   Impression & Recommendations:  Problem # 1:  SCIATICA, ACUTE (ICD-724.3) Assessment New Time spent with patient 25 minutes, more than 50% of this time was spent counseling patient on sciatica. Stop the Flexeril, will try Skelaxin. Discussed drowsiness and possible GI upset and side effects. Pt has allergy to codeine, will give Meloxicam for pain. See pt instructions.  The following medications were removed from the medication list:    Cyclobenzaprine Hcl 10 Mg Tabs (Cyclobenzaprine hcl) .Marland Kitchen... Take one tablet by mouth once daily for muscle spasms Her updated medication list for this problem includes:    Skelaxin 800 Mg Tabs (Metaxalone) .Marland Kitchen... 1 tab by mouth three  times a day as needed pain    Meloxicam 15 Mg Tabs (Meloxicam) ..... One by mouth daily as needed pain  Orders: Prescription Created Electronically [phone])  Complete Medication List: 1)  Pravachol 80 Mg Tabs (Pravastatin sodium) .... Take 1 tablet by mouth once a day 2)  Verapamil Hcl Cr 240 Mg Tbcr (Verapamil hcl) .... Take 1 tablet by mouth once a day 3)  Lisinopril 20 Mg Tabs (Lisinopril) .... Take 1 tablet by mouth once a day 4)  Azopt 1 % Susp (Brinzolamide) .... Use 1 drop in each eye twice a day 5)  0.5 % Trimcinolone/eucerine 1:1 Compounded  .... Aa two times a day x 2 weeks,  then as needed 6)  Metronidazole 0.75 % Gel (Metronidazole) .... Apply to fece two times a day 7)  Travatan Z 0.004 % Soln (Travoprost) .... Use one drop in each eye everyday at bedtime 8)  Skelaxin 800 Mg Tabs (Metaxalone) .Marland Kitchen.. 1 tab by mouth three times a day as needed pain 9)  Meloxicam 15 Mg Tabs (Meloxicam) .... One by mouth daily as needed pain  Patient Instructions: 1)  Most patients (90%) with low back pain will improve with time ( 2-6 weeks). Keep active but avoid activities that are painful. Apply moist heat and/or ice to lower back several times a day.  2)  Skelaxin 3 times a day as needed. 3)  Meloxicam daily for pain. Prescriptions: MELOXICAM 15 MG TABS (MELOXICAM) one by mouth daily as needed pain  #30 x 0   Entered and Authorized by:   Ruthe Mannan MD   Signed by:   Ruthe Mannan MD on 07/19/2009   Method used:   Print then Give to Patient   RxID:   [id number] SKELAXIN 800 MG TABS (METAXALONE) 1 tab by mouth three times a day as needed pain  #30 x 0   Entered and Authorized by:   Ruthe Mannan MD   Signed by:   Ruthe Mannan MD on 07/19/2009   Method used:   Print then Give to Patient   RxID:   [id number]   Current Allergies (reviewed today): CODEINE PHOSPHATE (CODEINE PHOSPHATE)

## 2010-04-01 NOTE — Miscellaneous (Signed)
Summary: flu vaccine  Clinical Lists Changes  Observations: Added new observation of FLU VAX: Historical by [facility] (12/17/2009 16:02)      Immunization History:  Influenza Immunization History:    Influenza:  historical by [facility] (12/17/2009)

## 2010-04-01 NOTE — Miscellaneous (Signed)
Summary: Discharge Summary/[facility]  Discharge Summary/[facility]   Imported By: Sherian Rein 11/07/2009 09:43:41  _____________________________________________________________________  External Attachment:    Type:   Image     Comment:   External Document

## 2010-04-01 NOTE — Progress Notes (Signed)
Summary: wants pain meds  Phone Note Call from Patient Call back at Home Phone [phone] Message from:  Patient  Caller: Patient Summary of Call: Pt is complaining of pain in right hip and leg, she was seen for this a couple of months ago and she says the pain has come back.  [facility] [address].  Last time she was given skelaxin and mobic but is asking if something else would be better. Initial call taken by: Lowella Petties CMA,  September 23, 2009 10:06 AM  Follow-up for Phone Call        reevaluation indicated Follow-up by: Hannah Beat MD,  September 23, 2009 10:12 AM  Additional Follow-up for Phone Call Additional follow up Details #1::        Advised pt, appt made.            Lowella Petties CMA  September 23, 2009 11:51 AM

## 2010-04-01 NOTE — Assessment & Plan Note (Signed)
Summary: cpx/dlo   Vital Signs:  Patient profile:   76 year old female Height:      64 inches Weight:      187 pounds BMI:     32.21 Temp:     99.0 degrees F oral Pulse rate:   68 / minute Pulse rhythm:   regular BP sitting:   140 / 78  (right arm) Cuff size:   large  Vitals Entered By: Linde Gillis CMA Duncan Dull) (October 02, 2009 3:05 PM) CC: 30 minute exam, Hypertension Management, Lipid Management   History of Present Illness: Saw Dr. Patsy Lager on 7/26 for back pain/right hip pain...given muscle relaxant and tramdol. Dx with acute sciatica. Also given balance retraining for vertigo and poor core strength.  Per pt she is doing better.   She walks with walker...pain worst in AM improves with walking and moving. Has appt for PT.  She falls intermittantly Dr. Patsy Lager felt right leg weakness causing falls.  Hypertension History:      She denies headache, palpitations, dyspnea with exertion, peripheral edema, neurologic problems, and syncope.  She notes no problems with any antihypertensive medication side effects.  BP moderate control at home.        Positive major cardiovascular risk factors include female age 35 years old or older, hyperlipidemia, and hypertension.  Negative major cardiovascular risk factors include non-tobacco-user status.    Lipid Management History:      Positive NCEP/ATP III risk factors include female age 78 years old or older and hypertension.  Negative NCEP/ATP III risk factors include non-tobacco-user status.        She expresses no side effects from her lipid-lowering medication.  The patient denies any symptoms to suggest myopathy or liver disease.  Comments: Taking pravachol intermittantly due to constipation .    Problems Prior to Update: 1)  Unspecified Vitamin D Deficiency  (ICD-268.9) 2)  Sciatica, Acute  (ICD-724.3) 3)  Vitamin B12 Deficiency  (ICD-266.2) 4)  Leukopenia, Mild  (ICD-288.50) 5)  Osteopenia  (ICD-733.90) 6)  Thrombocytopenia   (ICD-287.5) 7)  Special Screening For Osteoporosis  (ICD-V82.81) 8)  Other Screening Mammogram  (ICD-V76.12) 9)  Melanoma, Hx of  (ICD-V10.82) 10)  Essential Hypertension, Benign  (ICD-401.1) 11)  Ischemic Colitis, Hx of  (ICD-V12.79) 12)  Hypercholesterolemia, Pure  (ICD-272.0) 13)  S/P Exc Ganglion Wrst Dorsal/volar Prim  (CPT-25111) 14)  Degenerative Disc Disease, Lumbar Spine  (ICD-722.52) 15)  Low Back Pain, Chronic  (ICD-724.2) 16)  Female Stress Incontinence  (ICD-625.6) 17)  Hx of Pud, Hx of  (ICD-V12.71) 18)  Psvt  (ICD-427.0) 19)  Glaucoma Nos  (ICD-365.9) 20)  Hx of Diverticulosis, Colon W/hem  (ICD-562.12)  Current Medications (verified): 1)  Pravachol 80 Mg  Tabs (Pravastatin Sodium) .... Take 1/2 Tablet By Mouth Once A Day 2)  Verapamil Hcl Cr 240 Mg Tbcr (Verapamil Hcl) .... Take 1 Tablet By Mouth Once A Day 3)  Lisinopril 20 Mg Tabs (Lisinopril) .... Take 1 Tablet By Mouth Once A Day 4)  Azopt 1 %  Susp (Brinzolamide) .... Use 1 Drop in Each Eye Twice A Day 5)  0.5 % Trimcinolone/eucerine 1:1 Compounded .... Aa Two Times A Day X 2 Weeks, Then As Needed 6)  Metronidazole 0.75 % Gel (Metronidazole) .... Apply To Fece Two Times A Day 7)  Travatan Z 0.004 % Soln (Travoprost) .... Use One Drop in Each Eye Everyday At Bedtime 8)  Meloxicam 15 Mg Tabs (Meloxicam) .... One By Mouth Daily As Needed  Pain 9)  Tizanidine Hcl 4 Mg Tabs (Tizanidine Hcl) .Marland Kitchen.. 1 By Mouth At Bedtime 10)  Tramadol Hcl 50 Mg  Tabs (Tramadol Hcl) .Marland Kitchen.. 1 By Mouth 4 Times Daily As Needed Pain  Allergies: 1)  Codeine Phosphate (Codeine Phosphate)  Past History:  Past medical, surgical, family and social histories (including risk factors) reviewed, and no changes noted (except as noted below).  Past Medical History: Reviewed history from 09/24/2009 and no changes required. HYPERCHOLESTEROLEMIA, PURE (ICD-272.0) S/P EXC GANGLION WRST DORSAL/VOLAR PRIM (CPT-25111) DEGENERATIVE DISC DISEASE, LUMBAR SPINE  (ICD-722.52) LOW BACK PAIN, CHRONIC (ICD-724.2) FEMALE STRESS INCONTINENCE (ICD-625.6) Hx of PUD, HX OF (ICD-V12.71) PSVT (ICD-427.0) GLAUCOMA NOS (ICD-365.9) Hx of DIVERTICULOSIS, COLON W/HEM (ZOX-096.04)    Past Surgical History: Reviewed history from 05/26/2007 and no changes required. Hysterectomy 2003 stress test  Family History: Reviewed history from 07/05/2009 and no changes required. Father:   Prostate CA Mother:  Aneurysm, Alzheimer's Siblings:  3 brothers (heart, HTN, glaucoma)                1 sister, D - CA, arthritis 1 brother with Alzheimer's  Social History: Reviewed history from 09/06/2008 and no changes required. Marital Status: Married Children:  Occupation: Raised tobacco Never Smoked Alcohol use-no Drug use-no  Review of Systems       no vaginal irritation, no bleeding General:  Denies fatigue and fever. CV:  Denies chest pain or discomfort. Resp:  Denies shortness of breath. GI:  Denies abdominal pain. GU:  Complains of incontinence, nocturia, and urinary frequency; denies dysuria.  Physical Exam  General:  OVerweight appearing female in NAd Mouth:  MMM Neck:  no carotid bruit or thyromegaly no cervical or supraclavicular lymphadenopathy  Chest Wall:  No deformities, masses, or tenderness noted. Breasts:  No mass, nodules, thickening, tenderness, bulging, retraction, inflamation, nipple discharge or skin changes noted.   Lungs:  Normal respiratory effort, chest expands symmetrically. Lungs are clear to auscultation, no crackles or wheezes. Heart:  Normal rate and regular rhythm. S1 and S2 normal without gallop, murmur, click, rub or other extra sounds. Abdomen:  Bowel sounds positive,abdomen soft and non-tender without masses, organomegaly or hernias noted. Genitalia:  not indicated Pulses:  R and L posterior tibial pulses are full and equal bilaterally  Extremities:  no edema Skin:  erythematous cheeks, prominent vessela on cheeks and nose  and some areas on breasts   Impression & Recommendations:  Problem # 1:  HYPERCHOLESTEROLEMIA, PURE (ICD-272.0) Recommend taking 40 mg daily of pravachol EVERY day to see if better control in LDL but not as much constipation. Also decreased fried chicken. Her updated medication list for this problem includes:    Pravachol 80 Mg Tabs (Pravastatin sodium) .Marland Kitchen... Take 1/2 tablet by mouth once a day  Problem # 2:  SCIATICA, ACUTE (ICD-724.3) Improving with current treatment.  Her updated medication list for this problem includes:    Meloxicam 15 Mg Tabs (Meloxicam) ..... One by mouth daily as needed pain    Tizanidine Hcl 4 Mg Tabs (Tizanidine hcl) .Marland Kitchen... 1 by mouth at bedtime    Tramadol Hcl 50 Mg Tabs (Tramadol hcl) .Marland Kitchen... 1 by mouth 4 times daily as needed pain  Problem # 3:  ESSENTIAL HYPERTENSION, BENIGN (ICD-401.1) Well controlled. Continue current medication.  Her updated medication list for this problem includes:    Verapamil Hcl Cr 240 Mg Tbcr (Verapamil hcl) .Marland Kitchen... Take 1 tablet by mouth once a day    Lisinopril 20 Mg Tabs (Lisinopril) .Marland Kitchen... Take  1 tablet by mouth once a day  BP today: 140/78 Prior BP: 140/88 (09/24/2009)  10 Yr Risk Heart Disease: 9 % Prior 10 Yr Risk Heart Disease: 13 % (07/05/2009)  Labs Reviewed: K+: 4.3 (09/24/2009) Creat: : 0.7 (09/24/2009)   Chol: 231 (09/24/2009)   HDL: 50.60 (09/24/2009)   LDL: 91 (09/10/2008)   TG: 152.0 (09/24/2009)  Problem # 4:  URINARY URGENCY (ZOX-096.04) Assessment: Comment Only No sign of infection.  Pt not currently bothered enough by symptoms to consider medication treatment.   Problem # 5:  Preventive Health Care (ICD-V70.0) Reviewed preventive care protocols, scheduled due services, and updated immunizations.   Complete Medication List: 1)  Pravachol 80 Mg Tabs (Pravastatin sodium) .... Take 1/2 tablet by mouth once a day 2)  Verapamil Hcl Cr 240 Mg Tbcr (Verapamil hcl) .... Take 1 tablet by mouth once a day 3)   Lisinopril 20 Mg Tabs (Lisinopril) .... Take 1 tablet by mouth once a day 4)  Azopt 1 % Susp (Brinzolamide) .... Use 1 drop in each eye twice a day 5)  0.5 % Trimcinolone/eucerine 1:1 Compounded  .... Aa two times a day x 2 weeks, then as needed 6)  Metronidazole 0.75 % Gel (Metronidazole) .... Apply to fece two times a day 7)  Travatan Z 0.004 % Soln (Travoprost) .... Use one drop in each eye everyday at bedtime 8)  Meloxicam 15 Mg Tabs (Meloxicam) .... One by mouth daily as needed pain 9)  Tizanidine Hcl 4 Mg Tabs (Tizanidine hcl) .Marland Kitchen.. 1 by mouth at bedtime 10)  Tramadol Hcl 50 Mg Tabs (Tramadol hcl) .Marland Kitchen.. 1 by mouth 4 times daily as needed pain  Other Orders: UA Dipstick w/o Micro (manual) (54098)  Hypertension Assessment/Plan:      The patient's hypertensive risk group is category B: At least one risk factor (excluding diabetes) with no target organ damage.  Her calculated 10 year risk of coronary heart disease is 9 %.  Today's blood pressure is 140/78.  Her blood pressure goal is < 140/90.  Lipid Assessment/Plan:      Based on NCEP/ATP III, the patient's risk factor category is "0-1 risk factors".  The patient's lipid goals are as follows: Total cholesterol goal is 200; LDL cholesterol goal is 130; HDL cholesterol goal is 40; Triglyceride goal is 150.  Her LDL cholesterol goal has not been met.    Patient Instructions: 1)  Call to schedule mammogram in next few months. 2)  Call to schedule 5 year colonoscopy with Dr. Marina Goodell when ready. 3)  Change to 1/2 tab pravachol EVERY DAY. 4)   Recheck fasting LIPIDS in 3 months Dx 272.0    5)  Avoid high fat fried foods. 6)  Call if interested in urge incontinence treatment medication.  7)  Please schedule a follow-up appointment in 6 months  30 min OV.  Current Allergies (reviewed today): CODEINE PHOSPHATE (CODEINE PHOSPHATE) Last Mammogram:  normal (10/10/2008 3:54:19 PM) Mammogram Next Due:  Refused      Past Medical History:     Reviewed history from 09/24/2009 and no changes required:       HYPERCHOLESTEROLEMIA, PURE (ICD-272.0)       S/P EXC GANGLION WRST DORSAL/VOLAR PRIM (CPT-25111)       DEGENERATIVE DISC DISEASE, LUMBAR SPINE (ICD-722.52)       LOW BACK PAIN, CHRONIC (ICD-724.2)       FEMALE STRESS INCONTINENCE (ICD-625.6)       Hx of PUD, HX OF (ICD-V12.71)  PSVT (ICD-427.0)       GLAUCOMA NOS (ICD-365.9)       Hx of DIVERTICULOSIS, COLON W/HEM (ICD-562.12)          Past Surgical History:    Reviewed history from 05/26/2007 and no changes required:       Hysterectomy       2003 stress test    Laboratory Results   Urine Tests  Date/Time Received: October 02, 2009 4:28 PM   Routine Urinalysis   Color: yellow Appearance: Clear Glucose: negative   (Normal Range: Negative) Bilirubin: negative   (Normal Range: Negative) Ketone: negative   (Normal Range: Negative) Spec. Gravity: >=1.030   (Normal Range: 1.003-1.035) Blood: negative   (Normal Range: Negative) pH: 5.0   (Normal Range: 5.0-8.0) Protein: negative   (Normal Range: Negative) Urobilinogen: 0.2   (Normal Range: 0-1) Nitrite: negative   (Normal Range: Negative) Leukocyte Esterace: trace   (Normal Range: Negative)

## 2010-04-01 NOTE — Letter (Signed)
[facility]   Imported By: Maryln Gottron 06/10/2009 15:53:06  _____________________________________________________________________  External Attachment:    Type:   Image     Comment:   External Document  Appended Document: [facility] Notify pt we can absolutely get her on a 2 month schedule for vit B12 inj..just have her call to set up RN visit.   Appended Document: [facility] Patient advised by message on machine at home number.

## 2010-04-01 NOTE — Assessment & Plan Note (Signed)
Summary: F/U/CLE   Vital Signs:  Patient profile:   76 year old female Height:      64 inches Weight:      186.50 pounds BMI:     32.13 Temp:     98.2 degrees F oral Pulse rate:   72 / minute Pulse rhythm:   regular BP sitting:   140 / 80  (left arm) Cuff size:   regular  Vitals Entered By: Linde Gillis CMA Duncan Dull) (Jul 05, 2009 10:53 AM) CC: follow-up visit, Hypertension Management   History of Present Illness: B12 deficiency..needs B12 inj q 2 months per [facility]....wisheds to continue at [facility].   Leukopenia.Marland Kitchenno receurrent infections, nml Continue to follow with 4-6 months at [facility] . No further w/up per [facility]  In past 6 months..she has fallen several times..feels like her legs a weak. Last time she feel was over a month ago.  No proceeding symptoms.No chest pain, or irrigular heart rate. Occ dizzyness associated...no further weakness or dizzyness.  PSVT: occ has rapid spells, not regularly.     Hypertension History:      At home well controlled.        Positive major cardiovascular risk factors include female age 73 years old or older, hyperlipidemia, and hypertension.  Negative major cardiovascular risk factors include non-tobacco-user status.     Problems Prior to Update: 1)  Leukopenia, Mild  (ICD-288.50) 2)  Osteopenia  (ICD-733.90) 3)  Rash and Other Nonspecific Skin Eruption  (ICD-782.1) 4)  Abdominal Pain, Left Lower Quadrant, Hx of  (ICD-V15.89) 5)  Thrombocytopenia  (ICD-287.5) 6)  Special Screening For Osteoporosis  (ICD-V82.81) 7)  Other Screening Mammogram  (ICD-V76.12) 8)  Melanoma, Hx of  (ICD-V10.82) 9)  Essential Hypertension, Benign  (ICD-401.1) 10)  Luq Pain  (ICD-789.02) 11)  Dermatitis  (ICD-692.9) 12)  Ischemic Colitis, Hx of  (ICD-V12.79) 13)  Hypercholesterolemia, Pure  (ICD-272.0) 14)  S/P Exc Ganglion Wrst Dorsal/volar Prim  (CPT-25111) 15)  Degenerative Disc Disease, Lumbar Spine  (ICD-722.52) 16)  Low Back Pain, Chronic  (ICD-724.2) 17)   Female Stress Incontinence  (ICD-625.6) 18)  Hx of Pud, Hx of  (ICD-V12.71) 19)  Psvt  (ICD-427.0) 20)  Glaucoma Nos  (ICD-365.9) 21)  Hx of Diverticulosis, Colon W/hem  (ICD-562.12)  Current Medications (verified): 1)  Pravachol 80 Mg  Tabs (Pravastatin Sodium) .... Take 1 Tablet By Mouth Once A Day 2)  Verapamil Hcl Cr 240 Mg Tbcr (Verapamil Hcl) .... Take 1 Tablet By Mouth Once A Day 3)  Lisinopril 20 Mg Tabs (Lisinopril) .... Take 1 Tablet By Mouth Once A Day 4)  Azopt 1 %  Susp (Brinzolamide) .... Use 1 Drop in Each Eye Twice A Day 5)  0.5 % Trimcinolone/eucerine 1:1 Compounded .... Aa Two Times A Day X 2 Weeks, Then As Needed 6)  Metronidazole 0.75 % Gel (Metronidazole) .... Apply To Fece Two Times A Day 7)  Travatan Z 0.004 % Soln (Travoprost) .... Use One Drop in Each Eye Everyday At Bedtime  Allergies: 1)  Codeine Phosphate (Codeine Phosphate)  Past History:  Past medical, surgical, family and social histories (including risk factors) reviewed, and no changes noted (except as noted below).  Past Medical History: Reviewed history from 05/26/2007 and no changes required. Current Problems:  HYPERCHOLESTEROLEMIA, PURE (ICD-272.0) S/P EXC GANGLION WRST DORSAL/VOLAR PRIM (CPT-25111) DEGENERATIVE DISC DISEASE, LUMBAR SPINE (ICD-722.52) LOW BACK PAIN, CHRONIC (ICD-724.2) FEMALE STRESS INCONTINENCE (ICD-625.6) Hx of PUD, HX OF (ICD-V12.71) PSVT (ICD-427.0) GLAUCOMA NOS (ICD-365.9) Hx of  DIVERTICULOSIS, COLON W/HEM (ICD-562.12)    Past Surgical History: Reviewed history from 05/26/2007 and no changes required. Hysterectomy 2003 stress test  Family History: Reviewed history from 09/06/2008 and no changes required. Father:   Prostate CA Mother:  Aneurysm, Alzheimer's Siblings:  3 brothers (heart, HTN, glaucoma)                1 sister, D - CA, arthritis 1 brother with Alzheimer's  Social History: Reviewed history from 09/06/2008 and no changes required. Marital Status:  Married Children:  Occupation: Raised tobacco Never Smoked Alcohol use-no Drug use-no  Review of Systems General:  Denies fatigue and fever. CV:  Denies chest pain or discomfort. Resp:  Denies shortness of breath, sputum productive, and wheezing; mild DOE when climbing a hill, stable.Marland Kitchen GI:  Denies abdominal pain, bloody stools, constipation, and diarrhea. GU:  Denies dysuria.  Physical Exam  General:  overweight female in NAD Mouth:  MMM, good dentition and pharynx pink and moist.   Neck:  no cervical or supraclavicular lymphadenopathy, no carotid bruit or thyromegaly  Lungs:  Normal respiratory effort, chest expands symmetrically. Lungs are clear to auscultation, no crackles or wheezes. Heart:  Distant heart sound, no clear murmer, skips beats occ Abdomen:  Bowel sounds positive,abdomen soft and non-tender without masses, organomegaly or hernias noted. Pulses:  R and L posterior tibial pulses are full and equal bilaterally  Extremities:  no edema Skin:  erythematous cheeks, prominent vessela on cheeks and nose and some areas on breasts   Impression & Recommendations:  Problem # 1:  LEUKOPENIA, MILD (ICD-288.50) Followed by [facility]. Review last OV note.   Problem # 2:  ESSENTIAL HYPERTENSION, BENIGN (ICD-401.1) Well controlled. Continue current medication.  Her updated medication list for this problem includes:    Verapamil Hcl Cr 240 Mg Tbcr (Verapamil hcl) .Marland Kitchen... Take 1 tablet by mouth once a day    Lisinopril 20 Mg Tabs (Lisinopril) .Marland Kitchen... Take 1 tablet by mouth once a day  Problem # 3:  HYPERCHOLESTEROLEMIA, PURE (ICD-272.0) Due for reeval.  Her updated medication list for this problem includes:    Pravachol 80 Mg Tabs (Pravastatin sodium) .Marland Kitchen... Take 1 tablet by mouth once a day  Problem # 4:  VITAMIN B12 DEFICIENCY (ICD-266.2) Repletion per [facility].  Complete Medication List: 1)  Pravachol 80 Mg Tabs (Pravastatin sodium) .... Take 1 tablet by mouth once a day 2)   Verapamil Hcl Cr 240 Mg Tbcr (Verapamil hcl) .... Take 1 tablet by mouth once a day 3)  Lisinopril 20 Mg Tabs (Lisinopril) .... Take 1 tablet by mouth once a day 4)  Azopt 1 % Susp (Brinzolamide) .... Use 1 drop in each eye twice a day 5)  0.5 % Trimcinolone/eucerine 1:1 Compounded  .... Aa two times a day x 2 weeks, then as needed 6)  Metronidazole 0.75 % Gel (Metronidazole) .... Apply to fece two times a day 7)  Travatan Z 0.004 % Soln (Travoprost) .... Use one drop in each eye everyday at bedtime  Hypertension Assessment/Plan:      The patient's hypertensive risk group is category B: At least one risk factor (excluding diabetes) with no target organ damage.  Her calculated 10 year risk of coronary heart disease is 13 %.  Today's blood pressure is 140/80.  Her blood pressure goal is < 140/90.  Patient Instructions: 1)  Fasting lipids, CMET Dx 272.0, vit D, TSH Dx 780.79 prior to CPX 2)  Scheduled CPX in 08/30/2009.  Prescriptions: LISINOPRIL 20 MG  TABS (LISINOPRIL) Take 1 tablet by mouth once a day  #90 x 3   Entered and Authorized by:   Kerby Nora MD   Signed by:   Kerby Nora MD on 07/05/2009   Method used:   Electronically to        [facility]  [address] [phone]* (retail)       [address]       [place], [place]  [address]       Ph: [phone]       Fax: [phone]   RxID:   [id number] VERAPAMIL HCL CR 240 MG TBCR (VERAPAMIL HCL) Take 1 tablet by mouth once a day  #90 x 3   Entered and Authorized by:   Kerby Nora MD   Signed by:   Kerby Nora MD on 07/05/2009   Method used:   [facility]        [facility] [id number]       [address]       [place], [place]  [address]       Ph: [phone]       Fax: [phone]   RxID:   [id number] PRAVACHOL 80 MG  TABS (PRAVASTATIN SODIUM) Take 1 tablet by mouth once a day  #90 x 3   Entered and Authorized by:   Kerby Nora MD   Signed by:   Kerby Nora MD on  07/05/2009   Method used:   [facility]        [facility]  [address] [phone]* (retail)       [address]       [place], [place]  [address]       Ph: [phone]       Fax: [phone]   RxID:   [id number]   Current Allergies (reviewed today): CODEINE PHOSPHATE (CODEINE PHOSPHATE)  Herpes Zoster Result Date:  07/05/2009 Herpes Zoster Result:  given  Appended Document: F/U/CLE   Immunizations Administered:  Zostavax # 1:    Vaccine Type: Zostavax    Site: right deltoid    Mfr: Merck    Dose: 0.65ML    Route: [place]    Given by: Linde Gillis CMA (AAMA)    Exp. Date: 08/09/2010    Lot #: [id number]    VIS given: 12/12/04 given Jul 05, 2009.

## 2010-04-01 NOTE — Assessment & Plan Note (Signed)
Summary: PAIN IN RIGHT HIP AND LEG   Vital Signs:  Patient profile:   77 year old female Height:      64 inches Weight:      187.2 pounds BMI:     32.25 Temp:     99.0 degrees F oral Pulse rate:   72 / minute Pulse rhythm:   regular BP sitting:   140 / 88  (left arm) Cuff size:   regular  Vitals Entered By: Benny Lennert CMA Duncan Dull) (September 24, 2009 8:59 AM)  History of Present Illness: Chief complaint Pain in right hip and leg  76 year old female:  Two months ago, was having some pain in her back, was  lasted six weeks Had some back pain in the past, was put in the hospital in the past with back pain 20-30 years ago.  in discussion with her, this is actually  not particularly acute common in review of the chart,, patient does have some chronic history with back pain.  At the very least this is decompensated and gotten worse.  She is not having any numbness, tingling, bowel or bladder incontinence.  She is not having any focal weaknesses she can tell.  Vertigo likely down her leg on the posterior aspect of the knee.  She's been taking some muscle relaxers and some pain medication and anti-inflammatories.  no groin pain      Critical Exclusionary Diagnosis Criteria (CEDC) for Back Pain:      There are no symptoms to suggest infection, cauda equina, or psychosocial factors for back pain.    Allergies: 1)  Codeine Phosphate (Codeine Phosphate)  Past History:  Past medical, surgical, family and social histories (including risk factors) reviewed, and no changes noted (except as noted below).  Past Medical History: HYPERCHOLESTEROLEMIA, PURE (ICD-272.0) S/P EXC GANGLION WRST DORSAL/VOLAR PRIM (CPT-25111) DEGENERATIVE DISC DISEASE, LUMBAR SPINE (ICD-722.52) LOW BACK PAIN, CHRONIC (ICD-724.2) FEMALE STRESS INCONTINENCE (ICD-625.6) Hx of PUD, HX OF (ICD-V12.71) PSVT (ICD-427.0) GLAUCOMA NOS (ICD-365.9) Hx of DIVERTICULOSIS, COLON W/HEM (UJW-119.14)    Past Surgical  History: Reviewed history from 05/26/2007 and no changes required. Hysterectomy 2003 stress test  Family History: Reviewed history from 07/05/2009 and no changes required. Father:   Prostate CA Mother:  Aneurysm, Alzheimer's Siblings:  3 brothers (heart, HTN, glaucoma)                1 sister, D - CA, arthritis 1 brother with Alzheimer's  Social History: Reviewed history from 09/06/2008 and no changes required. Marital Status: Married Children:  Occupation: Raised tobacco Never Smoked Alcohol use-no Drug use-no  Physical Exam  General:  GEN: Well-developed,well-nourished,in no acute distress; alert,appropriate and cooperative throughout examination HEENT: Normocephalic and atraumatic without obvious abnormalities. No apparent alopecia or balding. Ears, externally no deformities PULM: Breathing comfortably in no respiratory distress EXT: No clubbing, cyanosis, or edema PSYCH: Normally interactive. Cooperative during the interview. Pleasant. Friendly and conversant. Not anxious or depressed appearing. Normal, full affect.  Msk:  Normal Greater trochanteric bursae hip ROM very limited but no pain with terminal rotation Negative Faber Negative Reverse Faber Sciatic Notches: mild ttp Sensation to Gross touch WNL Sensation to pinpricnk WNL DTR 2+ knee and ankle no clonus DP and PT pulses are normal B   R hip flexion and abduction 4/5 compared to 5/5 on L  Low Back Pain Physical Exam:    Inspection-deformity:     No    Palpation-spinal tenderness:   Yes  Location:   L5-S1    Motor Exam/Strength:         Left Ankle Dorsiflexion (L5,L4):     normal       Left Great Toe Dorsiflexion (L5,L4):     normal       Left Heel Walk (L5,some L4):     normal       Left Single Squat & Rise-Quads (L4):   normal       Left Toe Walk-calf (S1):       normal       Right Ankle Dorsiflexion (L5,L4):     normal       Right Great Toe Dorsiflexion (L5,L4):       normal       Right Heel  Walk (L5,some L4):     normal       Right Single Squat & Rise Quads (L4):   normal       Right Toe Walk-calf (S1):       normal    Sensory Exam/Pinprick:        Left Medial Foot (L4):   normal       Left Dorsal Foot (L5):   normal       Left Lateral Foot (S1):   normal       Right Medial Foot (L4):   normal       Right Dorsal Foot (L5):   normal       Right Lateral Foot (S1):   normal    Reflexes:        Left Knee Jerk (L4):     normal       Left Ankle Reflex (S1):   normal       Right Knee Jerk:     normal       Right Ankle Reflex (S1):   normal    Straight Leg Raise (SLR):       Left Straight Leg Raise (SLR):   negative       Right Straight Leg Raise (SLR):   negative   Impression & Recommendations:  Problem # 1:  SCIATICA, ACUTE (ICD-724.3) acute on chronic back pain.  Patient's got some muscular strength discrepancy in her pelvic and core musculature, and I think this penalize in this and working on her range of motion would help more than anything. We discussed the chronic nature of back pain in  the locations including chronic arthritic changes and chronic degenerative disc changes.  I suspect that nonmedicinal strengthening and range of motion would be of greater benefit anything. The patient also feels somewhat unsteady on her feet, and I have sent her for formal balance training and gait training.  The following medications were removed from the medication list:    Skelaxin 800 Mg Tabs (Metaxalone) .Marland Kitchen... 1 tab by mouth three times a day as needed pain Her updated medication list for this problem includes:    Meloxicam 15 Mg Tabs (Meloxicam) ..... One by mouth daily as needed pain    Tizanidine Hcl 4 Mg Tabs (Tizanidine hcl) .Marland Kitchen... 1 by mouth at bedtime    Tramadol Hcl 50 Mg Tabs (Tramadol hcl) .Marland Kitchen... 1 by mouth 4 times daily as needed pain  Orders: Physical Therapy Referral (PT) Prescription Created Electronically [phone])  Problem # 2:  LOW BACK PAIN, CHRONIC  (ICD-724.2)  The following medications were removed from the medication list:    Skelaxin 800 Mg Tabs (Metaxalone) .Marland Kitchen... 1 tab by mouth three times a day as needed  pain Her updated medication list for this problem includes:    Meloxicam 15 Mg Tabs (Meloxicam) ..... One by mouth daily as needed pain    Tizanidine Hcl 4 Mg Tabs (Tizanidine hcl) .Marland Kitchen... 1 by mouth at bedtime    Tramadol Hcl 50 Mg Tabs (Tramadol hcl) .Marland Kitchen... 1 by mouth 4 times daily as needed pain  Complete Medication List: 1)  Pravachol 80 Mg Tabs (Pravastatin sodium) .... Take 1 tablet by mouth once a day 2)  Verapamil Hcl Cr 240 Mg Tbcr (Verapamil hcl) .... Take 1 tablet by mouth once a day 3)  Lisinopril 20 Mg Tabs (Lisinopril) .... Take 1 tablet by mouth once a day 4)  Azopt 1 % Susp (Brinzolamide) .... Use 1 drop in each eye twice a day 5)  0.5 % Trimcinolone/eucerine 1:1 Compounded  .... Aa two times a day x 2 weeks, then as needed 6)  Metronidazole 0.75 % Gel (Metronidazole) .... Apply to fece two times a day 7)  Travatan Z 0.004 % Soln (Travoprost) .... Use one drop in each eye everyday at bedtime 8)  Meloxicam 15 Mg Tabs (Meloxicam) .... One by mouth daily as needed pain 9)  Tizanidine Hcl 4 Mg Tabs (Tizanidine hcl) .Marland Kitchen.. 1 by mouth at bedtime 10)  Tramadol Hcl 50 Mg Tabs (Tramadol hcl) .Marland Kitchen.. 1 by mouth 4 times daily as needed pain  Patient Instructions: 1)  Referral Appointment Information 2)  Day/Date: 3)  Time: 4)  Place/MD: 5)  Address: 6)  Phone/Fax: 7)  Patient given appointment information. Information/Orders faxed/mailed.  Prescriptions: TRAMADOL HCL 50 MG  TABS (TRAMADOL HCL) 1 by mouth 4 times daily as needed pain  #40 x 0   Entered and Authorized by:   Hannah Beat MD   Signed by:   Hannah Beat MD on 09/24/2009   Method used:   [facility]  [address] [phone]* (retail)       [address]       [place], [place]  [address]       Ph: [phone]        Fax: [phone]   RxID:   [id number] TIZANIDINE HCL 4 MG TABS (TIZANIDINE HCL) 1 by mouth at bedtime  #30 x 1   Entered and Authorized by:   Hannah Beat MD   Signed by:   Hannah Beat MD on 09/24/2009   Method used:   [facility]        [facility]  [address] [phone]* (retail)       [address]       [place], [place]  [address]       Ph: [phone]       Fax: [phone]   RxID:   [id number]   Current Allergies (reviewed today): CODEINE PHOSPHATE (CODEINE PHOSPHATE)

## 2010-04-01 NOTE — Miscellaneous (Signed)
Summary: PT Initial Summary/[facility]  PT Initial [facility]   Imported By: Lanelle Bal 10/23/2009 08:14:11  _____________________________________________________________________  External Attachment:    Type:   Image     Comment:   External Document

## 2010-04-01 NOTE — Letter (Signed)
Summary: Colonoscopy Letter  [facility]  [address] [place], [place] [address]   Phone: [phone]  Fax: [phone]      August 02, 2009 MRN: [id number]   [facility] [address] [place], [place]  [address]   Dear Ms. Kunde,   According to your medical record, it is time for you to schedule a Colonoscopy. The American Cancer Society recommends this procedure as a method to detect early colon cancer. Patients with a family history of colon cancer, or a personal history of colon polyps or inflammatory bowel disease are at increased risk.  This letter has been generated based on the recommendations made at the time of your procedure. If you feel that in your particular situation this may no longer apply, please contact our office.  Please call our office at [phone] to schedule this appointment or to update your records at your earliest convenience.  Thank you for cooperating with [place] to provide you with the very best care possible.   Sincerely,  Wilhemina Bonito. Marina Goodell, M.D.  [facility] [phone]

## 2010-04-03 NOTE — Assessment & Plan Note (Signed)
Summary: F/U/DLO   Vital Signs:  Patient profile:   76 year old female Height:      64 inches Weight:      184.50 pounds BMI:     31.78 Temp:     98.6 degrees F oral Pulse rate:   68 / minute Pulse rhythm:   regular BP sitting:   140 / 90  (left arm) Cuff size:   large  Vitals Entered By: Benny Lennert CMA Duncan Dull) (March 04, 2010 9:16 AM)  History of Present Illness: Chief complaint follow up   Has lost 3 lbs since last OV.   Urinary urgency and incontinence has continues to bother her a lot in last few months. Urinating 3-4 times a night. Last fluid at 5 PM at dinner.  No dysuria. No fever, no abdominal pain.   UA last appt was clear.  Pain in left shoulder, left neck. Intermittant.  No numbness, no tingling.  No weakness. Pain with abduction...no pain with int/ext rotation. Told DDD in neck... told she would need surgery years ago..never had...had not had much proiblem with it over the years.   Has to lift her elderly dog a lot.   Hypertension History:      BPs at home running 130/80s.        Positive major cardiovascular risk factors include female age 7 years old or older, hyperlipidemia, and hypertension.  Negative major cardiovascular risk factors include non-tobacco-user status.    Lipid Management History:      Positive NCEP/ATP III risk factors include female age 34 years old or older and hypertension.  Negative NCEP/ATP III risk factors include non-tobacco-user status.        Adjunctive measures started by the patient include aerobic exercise, fiber, limit alcohol consumpton, and weight reduction.  She expresses no side effects from her lipid-lowering medication.  The patient denies any symptoms to suggest myopathy or liver disease.  Comments: Much improved control of cholesterol taking medicaiton every day.    Problems Prior to Update: 1)  Urinary Urgency  (NGE-952.84) 2)  Unspecified Vitamin D Deficiency  (ICD-268.9) 3)  Sciatica, Acute  (ICD-724.3) 4)   Vitamin B12 Deficiency  (ICD-266.2) 5)  Leukopenia, Mild  (ICD-288.50) 6)  Osteopenia  (ICD-733.90) 7)  Thrombocytopenia  (ICD-287.5) 8)  Special Screening For Osteoporosis  (ICD-V82.81) 9)  Other Screening Mammogram  (ICD-V76.12) 10)  Melanoma, Hx of  (ICD-V10.82) 11)  Essential Hypertension, Benign  (ICD-401.1) 12)  Ischemic Colitis, Hx of  (ICD-V12.79) 13)  Hypercholesterolemia, Pure  (ICD-272.0) 14)  S/P Exc Ganglion Wrst Dorsal/volar Prim  (CPT-25111) 15)  Degenerative Disc Disease, Lumbar Spine  (ICD-722.52) 16)  Low Back Pain, Chronic  (ICD-724.2) 17)  Female Stress Incontinence  (ICD-625.6) 18)  Hx of Pud, Hx of  (ICD-V12.71) 19)  Psvt  (ICD-427.0) 20)  Glaucoma Nos  (ICD-365.9) 21)  Hx of Diverticulosis, Colon W/hem  (ICD-562.12)  Current Medications (verified): 1)  Pravachol 80 Mg  Tabs (Pravastatin Sodium) .... Take 1/2 Tablet By Mouth Once A Day 2)  Verapamil Hcl Cr 240 Mg Tbcr (Verapamil Hcl) .... Take 1 Tablet By Mouth Once A Day 3)  Lisinopril 20 Mg Tabs (Lisinopril) .... Take 1 Tablet By Mouth Once A Day 4)  Azopt 1 %  Susp (Brinzolamide) .... Use 1 Drop in Each Eye Twice A Day 5)  0.5 % Trimcinolone/eucerine 1:1 Compounded .... Aa Two Times A Day X 2 Weeks, Then As Needed 6)  Travatan Z 0.004 % Soln (Travoprost) .Marland KitchenMarland KitchenMarland Kitchen  Use One Drop in Each Eye Everyday At Bedtime 7)  Meloxicam 15 Mg Tabs (Meloxicam) .... One By Mouth Daily As Needed Pain 8)  Vesicare 5 Mg Tabs (Solifenacin Succinate) .Marland Kitchen.. 1 Tab By Mouth At Bedtime  Allergies: 1)  Codeine Phosphate (Codeine Phosphate)  Past History:  Past medical, surgical, family and social histories (including risk factors) reviewed, and no changes noted (except as noted below).  Past Medical History: Reviewed history from 09/24/2009 and no changes required. HYPERCHOLESTEROLEMIA, PURE (ICD-272.0) S/P EXC GANGLION WRST DORSAL/VOLAR PRIM (CPT-25111) DEGENERATIVE DISC DISEASE, LUMBAR SPINE (ICD-722.52) LOW BACK PAIN, CHRONIC  (ICD-724.2) FEMALE STRESS INCONTINENCE (ICD-625.6) Hx of PUD, HX OF (ICD-V12.71) PSVT (ICD-427.0) GLAUCOMA NOS (ICD-365.9) Hx of DIVERTICULOSIS, COLON W/HEM (ZOX-096.04)    Past Surgical History: Reviewed history from 05/26/2007 and no changes required. Hysterectomy 2003 stress test  Family History: Reviewed history from 07/05/2009 and no changes required. Father:   Prostate CA Mother:  Aneurysm, Alzheimer's Siblings:  3 brothers (heart, HTN, glaucoma)                1 sister, D - CA, arthritis 1 brother with Alzheimer's  Social History: Reviewed history from 09/06/2008 and no changes required. Marital Status: Married Children:  Occupation: Raised tobacco Never Smoked Alcohol use-no Drug use-no  Review of Systems General:  Denies fatigue and fever. CV:  Denies chest pain or discomfort. Resp:  Denies shortness of breath. GI:  Denies abdominal pain and bloody stools. GU:  Complains of urinary frequency; denies abnormal vaginal bleeding, hematuria, and urinary hesitancy.  Physical Exam  General:  Overweight appearing female in NAD Mouth:  MMM Neck:  no carotid bruit or thyromegaly no cervical or supraclavicular lymphadenopathy  Lungs:  Normal respiratory effort, chest expands symmetrically. Lungs are clear to auscultation, no crackles or wheezes. Heart:  Normal rate and regular rhythm. S1 and S2 normal without gallop, murmur, click, rub or other extra sounds. Abdomen:  Bowel sounds positive,abdomen soft and non-tender without masses, organomegaly or hernias noted. Msk:  ttp in left trazpezius and at C4-6 in cervical spoine. Decrease ROM in neck. Neg Spurling's B. No subacrominal ttp in left shoulder, neg impingement sign, neg drop arm test.  Pulses:  R and L posterior tibial pulses are full and equal bilaterally  Extremities:  no edema   Impression & Recommendations:  Problem # 1:  URINARY URGENCY (ICD-788.63) No sign of UTI  last OV on UA. No current symptoms of  UTI. Treat for urge incontinence with Toviaz. Reviewed benefits and possible SE with pt in detail. Call if symptoms not imprpving as expected.   Problem # 2:  SHOULDER PAIN, LEFT (ICD-719.41) ? most consistent with referral for DDD in neck. Treat with NSAIDs, heat and Gentle stretching. If not imrpoving as expected in 2 weeks.. consider X-ray eval.  Follow up if pain in neck not improving in 2 weeks.  The following medications were removed from the medication list:    Tizanidine Hcl 4 Mg Tabs (Tizanidine hcl) .Marland Kitchen... 1 by mouth at bedtime    Tramadol Hcl 50 Mg Tabs (Tramadol hcl) .Marland Kitchen... 1 by mouth 4 times daily as needed pain Her updated medication list for this problem includes:    Meloxicam 15 Mg Tabs (Meloxicam) ..... One by mouth daily as needed pain  Problem # 3:  ESSENTIAL HYPERTENSION, BENIGN (ICD-401.1) Borederline control.. follow at home. Continue current medications.  Her updated medication list for this problem includes:    Verapamil Hcl Cr 240 Mg Tbcr (Verapamil hcl) .Marland KitchenMarland KitchenMarland KitchenMarland Kitchen  Take 1 tablet by mouth once a day    Lisinopril 20 Mg Tabs (Lisinopril) .Marland Kitchen... Take 1 tablet by mouth once a day  BP today: 140/90 Prior BP: 140/78 (10/02/2009)  10 Yr Risk Heart Disease: 17 % Prior 10 Yr Risk Heart Disease: 9 % (10/02/2009)  Labs Reviewed: K+: 4.3 (09/24/2009) Creat: : 0.7 (09/24/2009)   Chol: 180 (02/14/2010)   HDL: 44.50 (02/14/2010)   LDL: 111 (02/14/2010)   TG: 124.0 (02/14/2010)  Problem # 4:  HYPERCHOLESTEROLEMIA, PURE (ICD-272.0) Well controlled. Continue current medication.  Labs Reviewed: SGOT: 15 (02/14/2010)   SGPT: 13 (02/14/2010)  Lipid Goals: Chol Goal: 200 (10/02/2009)   HDL Goal: 40 (10/02/2009)   LDL Goal: 130 (10/02/2009)   TG Goal: 150 (10/02/2009)  10 Yr Risk Heart Disease: 17 % Prior 10 Yr Risk Heart Disease: 9 % (10/02/2009)   HDL:44.50 (02/14/2010), 50.60 (09/24/2009)  LDL:111 (02/14/2010), 91 (16/12/9602)  Chol:180 (02/14/2010), 231 (09/24/2009)  Trig:124.0  (02/14/2010), 152.0 (09/24/2009)  Complete Medication List: 1)  Pravachol 80 Mg Tabs (Pravastatin sodium) .... Take 1/2 tablet by mouth once a day 2)  Verapamil Hcl Cr 240 Mg Tbcr (Verapamil hcl) .... Take 1 tablet by mouth once a day 3)  Lisinopril 20 Mg Tabs (Lisinopril) .... Take 1 tablet by mouth once a day 4)  Azopt 1 % Susp (Brinzolamide) .... Use 1 drop in each eye twice a day 5)  0.5 % Trimcinolone/eucerine 1:1 Compounded  .... Aa two times a day x 2 weeks, then as needed 6)  Travatan Z 0.004 % Soln (Travoprost) .... Use one drop in each eye everyday at bedtime 7)  Meloxicam 15 Mg Tabs (Meloxicam) .... One by mouth daily as needed pain 8)  Vesicare 5 Mg Tabs (Solifenacin succinate) .Marland Kitchen.. 1 tab by mouth at bedtime  Other Orders: Vit B12 1000 mcg (J3420) Admin of Therapeutic Inj  intramuscular or subcutaneous (54098)  Hypertension Assessment/Plan:      The patient's hypertensive risk group is category B: At least one risk factor (excluding diabetes) with no target organ damage.  Her calculated 10 year risk of coronary heart disease is 17 %.  Today's blood pressure is 140/90.  Her blood pressure goal is < 140/90.  Lipid Assessment/Plan:      Based on NCEP/ATP III, the patient's risk factor category is "0-1 risk factors".  The patient's lipid goals are as follows: Total cholesterol goal is 200; LDL cholesterol goal is 130; HDL cholesterol goal is 40; Triglyceride goal is 150.  Her LDL cholesterol goal has been met.    Patient Instructions: 1)  Follow BP at home.. call if above 140/90. 2)   Continue cholesterol medication.  3)  Call if vesicare not helping or if not tolerating side effects. 4)  Gentle stretching and heat in your neck. 5)  Use melocxicam for pain. 6)  Follow up if pain in neck not improving in 2 weeks.  7)  Scheduled Follow up in 6 months with fasting labs prior Dx 272.0 CMET, lipids. Prescriptions: LISINOPRIL 20 MG TABS (LISINOPRIL) Take 1 tablet by mouth once a day   #90 x 3   Entered and Authorized by:   Kerby Nora MD   Signed by:   Kerby Nora MD on 03/04/2010   Method used:   Electronically to        [facility]  [address] [phone]* (retail)       [address] [facility]       [place]  [place], [place]  [address]       Ph: [phone]       Fax: [phone]   RxID:   [id number] VERAPAMIL HCL CR 240 MG TBCR (VERAPAMIL HCL) Take 1 tablet by mouth once a day  #90 x 3   Entered and Authorized by:   Kerby Nora MD   Signed by:   Kerby Nora MD on 03/04/2010   Method used:   [facility]ronically to        [facility]  [address] [phone]* (retail)       [address]       [place], [place]  [address]       Ph: [phone]       Fax: [phone]   RxID:   [id number] PRAVACHOL 80 MG  TABS (PRAVASTATIN SODIUM) Take 1/2 tablet by mouth once a day  #90 x 3   Entered and Authorized by:   Kerby Nora MD   Signed by:   Kerby Nora MD on 03/04/2010   Method used:   [facility]        [facility]  [address] [phone]* (retail)       [address]       [place], [place]  [address]       Ph: [phone]       Fax: [phone]   RxID:   [id number] MELOXICAM 15 MG TABS (MELOXICAM) one by mouth daily as needed pain  #30 x 1   Entered and Authorized by:   Kerby Nora MD   Signed by:   Kerby Nora MD on 03/04/2010   Method used:   [facility]        [facility]  [address] [phone]* (retail)       [address]       [place], [place]  [address]       Ph: [phone]       Fax: [phone]   RxID:   [id number] VESICARE 5 MG TABS (SOLIFENACIN SUCCINATE) 1 tab by mouth at bedtime  #30 x 11   Entered and Authorized by:   Kerby Nora MD   Signed by:   Kerby Nora MD on 03/04/2010   Method used:   [facility]        [facility]  [address] [phone] (retail)       [address]       [place], [place]  [address]       Ph: [phone]       Fax: [phone]   RxID:    [id number]    Medication Administration  Injection # 1:    Medication: Vit B12 1000 mcg    Diagnosis: VITAMIN B12 DEFICIENCY (ICD-266.2)    Route: IM    Site: R deltoid    Exp Date: 08/31/2011    Lot #: [id number]    Mfr: [employer]    Patient tolerated injection without complications    Given by: Benny Lennert CMA (AAMA) (March 04, 2010 9:44 AM)  Orders Added: 1)  Vit B12 1000 mcg [J3420] 2)  Admin of Therapeutic Inj  intramuscular or subcutaneous [96372] 3)  Est. Patient Level IV [26834]    Current Allergies (reviewed today): CODEINE PHOSPHATE (CODEINE PHOSPHATE)  Last Flu Vaccine:  Historical by [facility] (12/17/2009 4:02:04  PM) Flu Vaccine Next Due:  1 yr

## 2010-04-03 NOTE — Progress Notes (Signed)
Summary: regarding B12 injections  Phone Note From Other Clinic   Caller: Amy with Dr. Milta Deiters office  [phone] Summary of Call: Pt needs monthly Vitamin B12 injections and Dr Milta Deiters nurse Amy is asking if pt can come here for the injections, especially since Dr. Welton Flakes has moved to [facility].  If ok they will send an order.  Please advise. Initial call taken by: Lowella Petties CMA, AAMA,  February 26, 2010 4:37 PM  Follow-up for Phone Call        yes. Follow-up by: Hannah Beat MD,  February 26, 2010 4:38 PM  Additional Follow-up for Phone Call Additional follow up Details #1::        dr Rosine Beat office.Consuello Masse CMA   Additional Follow-up by: Benny Lennert CMA Duncan Dull),  February 27, 2010 8:34 AM

## 2010-04-03 NOTE — Progress Notes (Signed)
Summary: pt wants lab appt  Phone Note Call from Patient Call back at Home Phone [phone]   Caller: Patient Call For: Kerby Nora MD Summary of Call: Pt called to schedule lab appt , please order. Initial call taken by: Lowella Petties CMA, AAMA,  February 12, 2010 12:11 PM  Follow-up for Phone Call        Fasting LIPIDS, ASt, ALT Dx 272.0 Follow-up by: Kerby Nora MD,  February 12, 2010 11:03 PM  Additional Follow-up for Phone Call Additional follow up Details #1::        Patient called and appt made for labs patient will make appt up front after labs for OV Additional Follow-up by: Benny Lennert CMA Duncan Dull),  February 13, 2010 8:13 AM

## 2010-04-03 NOTE — Letter (Signed)
Summary: [facility]  [facility]   Imported By: Lanelle Bal 02/25/2010 10:44:52  _____________________________________________________________________  External Attachment:    Type:   Image     Comment:   External Document

## 2010-04-04 ENCOUNTER — Encounter: Payer: Self-pay | Admitting: Family Medicine

## 2010-04-04 ENCOUNTER — Ambulatory Visit: Payer: Medicare Other

## 2010-04-04 ENCOUNTER — Ambulatory Visit: Admit: 2010-04-04 | Payer: Self-pay | Admitting: Family Medicine

## 2010-04-04 DIAGNOSIS — E538 Deficiency of other specified B group vitamins: Secondary | ICD-10-CM

## 2010-04-09 NOTE — Assessment & Plan Note (Signed)
Summary: NURSE VISIT  Nurse Visit   Allergies: 1)  Codeine Phosphate (Codeine Phosphate)  Medication Administration  Injection # 1:    Medication: Vit B12 1000 mcg    Diagnosis: VITAMIN B12 DEFICIENCY (ICD-266.2)    Route: IM    Site: R deltoid    Exp Date: 08/31/2011    Lot #: [id number]    Mfr: [employer]    Patient tolerated injection without complications    Given by: Mervin Hack CMA (AAMA) (April 04, 2010 9:19 AM)  Orders Added: 1)  Vit B12 1000 mcg [J3420] 2)  Admin of Therapeutic Inj  intramuscular or subcutaneous [96372]   Medication Administration  Injection # 1:    Medication: Vit B12 1000 mcg    Diagnosis: VITAMIN B12 DEFICIENCY (ICD-266.2)    Route: IM    Site: R deltoid    Exp Date: 08/31/2011    Lot #: [id number]    Mfr: [employer]    Patient tolerated injection without complications    Given by: Mervin Hack CMA (AAMA) (April 04, 2010 9:19 AM)  Orders Added: 1)  Vit B12 1000 mcg [J3420] 2)  Admin of Therapeutic Inj  intramuscular or subcutaneous [84166]

## 2010-04-18 ENCOUNTER — Other Ambulatory Visit: Payer: Self-pay | Admitting: Oncology

## 2010-04-18 ENCOUNTER — Encounter: Payer: Self-pay | Admitting: Family Medicine

## 2010-04-18 ENCOUNTER — Encounter (HOSPITAL_BASED_OUTPATIENT_CLINIC_OR_DEPARTMENT_OTHER): Payer: Medicare Other | Admitting: Oncology

## 2010-04-18 DIAGNOSIS — D518 Other vitamin B12 deficiency anemias: Secondary | ICD-10-CM

## 2010-04-18 DIAGNOSIS — E538 Deficiency of other specified B group vitamins: Secondary | ICD-10-CM

## 2010-04-18 LAB — CBC WITH DIFFERENTIAL/PLATELET
BASO%: 0.3 % (ref 0.0–2.0)
EOS%: 3 % (ref 0.0–7.0)
HCT: 42 % (ref 34.8–46.6)
MCH: 30.2 pg (ref 25.1–34.0)
MCHC: 34.5 g/dL (ref 31.5–36.0)
MCV: 87.4 fL (ref 79.5–101.0)
MONO%: 5.3 % (ref 0.0–14.0)
NEUT%: 67.6 % (ref 38.4–76.8)
RDW: 13.1 % (ref 11.2–14.5)
lymph#: 1 10*3/uL (ref 0.9–3.3)

## 2010-04-24 ENCOUNTER — Encounter: Payer: Self-pay | Admitting: Family Medicine

## 2010-05-06 ENCOUNTER — Ambulatory Visit (INDEPENDENT_AMBULATORY_CARE_PROVIDER_SITE_OTHER): Payer: Medicare Other

## 2010-05-06 ENCOUNTER — Encounter: Payer: Self-pay | Admitting: Family Medicine

## 2010-05-06 DIAGNOSIS — E538 Deficiency of other specified B group vitamins: Secondary | ICD-10-CM

## 2010-05-08 NOTE — Letter (Signed)
Summary: [facility]-Addended Report  [facility]-Addended Report   Imported By: Maryln Gottron 04/30/2010 12:55:34  _____________________________________________________________________  External Attachment:    Type:   Image     Comment:   External Document

## 2010-05-08 NOTE — Letter (Signed)
Summary: [facility]   [facility]   Imported By: Kassie Mends 04/29/2010 09:17:15  _____________________________________________________________________  External Attachment:    Type:   Image     Comment:   External Document  Appended Document: [facility]  Has appt 3/6 for B12 IM.

## 2010-05-08 NOTE — Letter (Signed)
Summary: [facility]  [facility]   Imported By: Kassie Mends 04/29/2010 10:44:06  _____________________________________________________________________  External Attachment:    Type:   Image     Comment:   External Document

## 2010-05-13 NOTE — Assessment & Plan Note (Signed)
Summary: B12  Nurse Visit   Allergies: 1)  Codeine Phosphate (Codeine Phosphate)  Medication Administration  Injection # 1:    Medication: Vit B12 1000 mcg    Diagnosis: VITAMIN B12 DEFICIENCY (ICD-266.2)    Route: IM    Site: L deltoid    Exp Date: 12/01/2011    Lot #: [id number]    Mfr: [employer]    Patient tolerated injection without complications    Given by: Sydell Axon LPN (May 05, 8117 9:00 AM)  Orders Added: 1)  Vit B12 1000 mcg [J3420] 2)  Admin of Therapeutic Inj  intramuscular or subcutaneous [96372]   Medication Administration  Injection # 1:    Medication: Vit B12 1000 mcg    Diagnosis: VITAMIN B12 DEFICIENCY (ICD-266.2)    Route: IM    Site: L deltoid    Exp Date: 12/01/2011    Lot #: [id number]    Mfr: [employer]    Patient tolerated injection without complications    Given by: Sydell Axon LPN (May 05, 1476 9:00 AM)  Orders Added: 1)  Vit B12 1000 mcg [J3420] 2)  Admin of Therapeutic Inj  intramuscular or subcutaneous [29562]

## 2010-06-10 ENCOUNTER — Ambulatory Visit: Payer: BLUE CROSS/BLUE SHIELD

## 2010-06-12 ENCOUNTER — Ambulatory Visit (INDEPENDENT_AMBULATORY_CARE_PROVIDER_SITE_OTHER): Payer: Medicare Other | Admitting: Family Medicine

## 2010-06-12 DIAGNOSIS — E538 Deficiency of other specified B group vitamins: Secondary | ICD-10-CM

## 2010-06-12 MED ORDER — CYANOCOBALAMIN 1000 MCG/ML IJ SOLN
1000.0000 ug | Freq: Once | INTRAMUSCULAR | Status: AC
  Administered 2010-06-12: 1000 ug via INTRAMUSCULAR

## 2010-06-13 NOTE — Progress Notes (Signed)
  Subjective:    Patient ID: Debra [name], female    DOB: Feb 20, 1935, 76 y.o.   MRN: [id number]  HPI    Review of Systems     Objective:   Physical Exam     B12 vaccine given by CMA.   Assessment & Plan:

## 2010-06-17 ENCOUNTER — Other Ambulatory Visit: Payer: Self-pay | Admitting: Oncology

## 2010-06-17 ENCOUNTER — Encounter (HOSPITAL_BASED_OUTPATIENT_CLINIC_OR_DEPARTMENT_OTHER): Payer: Medicare Other | Admitting: Oncology

## 2010-06-17 DIAGNOSIS — D518 Other vitamin B12 deficiency anemias: Secondary | ICD-10-CM

## 2010-06-17 DIAGNOSIS — D469 Myelodysplastic syndrome, unspecified: Secondary | ICD-10-CM

## 2010-06-17 LAB — CBC WITH DIFFERENTIAL/PLATELET
BASO%: 0.7 % (ref 0.0–2.0)
Basophils Absolute: 0 10*3/uL (ref 0.0–0.1)
EOS%: 3.1 % (ref 0.0–7.0)
HCT: 40.1 % (ref 34.8–46.6)
HGB: 13.8 g/dL (ref 11.6–15.9)
MCH: 30.2 pg (ref 25.1–34.0)
MCHC: 34.4 g/dL (ref 31.5–36.0)
MCV: 87.8 fL (ref 79.5–101.0)
MONO%: 6.1 % (ref 0.0–14.0)
NEUT%: 63.9 % (ref 38.4–76.8)
RDW: 13.3 % (ref 11.2–14.5)
lymph#: 1 10*3/uL (ref 0.9–3.3)

## 2010-06-17 LAB — VITAMIN B12: Vitamin B-12: 444 pg/mL (ref 211–911)

## 2010-07-15 ENCOUNTER — Ambulatory Visit (INDEPENDENT_AMBULATORY_CARE_PROVIDER_SITE_OTHER): Payer: Medicare Other | Admitting: Family Medicine

## 2010-07-15 DIAGNOSIS — E538 Deficiency of other specified B group vitamins: Secondary | ICD-10-CM

## 2010-07-15 MED ORDER — CYANOCOBALAMIN 1000 MCG/ML IJ SOLN
1000.0000 ug | Freq: Once | INTRAMUSCULAR | Status: AC
  Administered 2010-07-15: 1000 ug via INTRAMUSCULAR

## 2010-07-15 NOTE — Progress Notes (Signed)
B12 injection given during nurse visit today. 

## 2010-07-18 NOTE — Assessment & Plan Note (Signed)
[facility]                           GASTROENTEROLOGY OFFICE NOTE   ARMANDINA, Debra [name]                   MRN:          [id number]  DATE:12/15/2005                            DOB:          [birth year]    REASON FOR CONSULTATION:  Recent problems with abdominal pain, rectal  bleeding.   HISTORY:  This is a pleasant 76 year old white female with a history of  hypertension, hyperlipidemia, osteoarthritis, and colon polyps who is  referred through the courtesy of Dr. Ermalene Searing regarding abdominal pain and  rectal bleeding.  The patient last underwent colonoscopy Jul 01, 2004.  At  that time she was found to have diverticulosis and a diminutive transverse  colon polyp which was removed.  Followup in 5 years recommended.  She did  well until November 30, 2005 when she awoke with severe left lower quadrant  abdominal pain, vomiting and subsequent passage of bloody stools.  Her  symptoms persisted, and she was evaluated December 01, 2005 by Dr. Ermalene Searing.  Hemoglobin at that time was 15.  She was felt possibly to have  diverticulitis and placed on ciprofloxacin and Flagyl x10 days.  She was  evaluated in the office again on December 02, 2005 and again on December 07, 2005.  Within about 5 to 6 days the patient's symptoms had resolved.  Her  hemoglobin had dropped only minimally to 14.2.  Currently, her own symptom  is that of slight weakness and slightly loose stools since completing  antibiotics.  No nausea or fevers at this time.  No abdominal pain.  She did  not have any radiologic studies performed.   PAST MEDICAL HISTORY:  As above.   PAST SURGICAL HISTORY:  Hysterectomy.   ALLERGIES:  DIOVAN (?).   MEDICATIONS:  1. Verapamil 240 mg daily.  2. Zetia 10 mg daily.  3. Lisinopril 20 mg daily.  4. Lumigan 2.5 ml.  5. Azopt eye drops b.i.d.   FAMILY HISTORY:  No family history of gastrointestinal malignancy.   SOCIAL HISTORY:  Patient is married with 4  children.  She is accompanied by  her husband, with whom she lives.  She is retired.  She does not smoke or  use alcohol.   REVIEW OF SYMPTOMS:  Per diagnostic evaluation.   PHYSICAL EXAMINATION:  GENERAL:  Well-appearing, elderly female, in no acute  distress.  VITAL SIGNS:  Blood pressure is 130/84.  Heart rate is 60 with an occasional  irregular beat.  Her weight is 187 pounds.  She is 5 feet 4 inches in  height.  HEENT:  Hypervascularity in the malar region.  Oral mucosa is intact.  NECK:  There is no adenopathy.  LUNGS:  Clear.  HEART:  Has an occasional irregular beat.  ABDOMEN:  Obese and soft without tenderness, mass or hernia.  Good bowel  sounds heard.  RECTAL:  Exam was omitted.  EXTREMITIES:  Without edema.   IMPRESSION:  This is a 76 year old female who about 2 weeks ago had problems  with acute onset of left lower quadrant abdominal pain associated with  vomiting and red blood per rectum.  Her problem has since resolved.  Though  diverticulitis certainly can cause abdominal pain, her presentation to me is  more consistent with ischemic colitis.  She has made full recovery.  In any  event, I do not think further evaluation  or treatment is necessary at this  point.  I did discuss with her and her husband etiology of ischemic colitis  as well as certain risk factors.  They understood.  She will return to the  general care of Dr. Ermalene Searing and return to this office on an as-needed basis.            ______________________________  Wilhemina Bonito. Eda Keys., MD      JNP/MedQ  DD:  12/15/2005  DT:  12/16/2005  Job #:  [id number]   cc:   Kerby Nora, MD

## 2010-07-18 NOTE — Discharge Summary (Signed)
. [facility]  Patient:    Debra [name], Debra [name]                   MRN: [id number] Adm. Date:  [id number] Disc. Date: 11/30/99 Attending:  Duke Salvia CC:         Rosalyn Gess. Norins, M.D. [facility]   Discharge Summary  DATE OF BIRTH:  01/01/1935  FINAL DIAGNOSES: 1. Atypical chest pain and myocardial infarction, ruled out. 2. Hyperlipidemia.  TESTS IN HOSPITAL:  Adenosine Cardiolite, rest and stress, negative.  HISTORY OF PRESENT ILLNESS:  The patient is a 76 year old female who presents with several days of substernal chest pain.  For the details, please address to Dr. Debby Bud history and physical on November 27, 1999.  HOSPITAL COURSE:  During the course of hospitalization, the patient was anticoagulated for presumed unstable angina.  Stress test was obtained with the above results.  She continued to remain chest pain free.  Due to the lack of IV access, a central line was placed, which will be discontinued on the day of discharge.  On the day of discharge her temperature is 97.7, blood pressure 101/69, pulse 64, respirations 18.  She looks well.  She is in no acute distress. Lungs are clear.  Heart is regular.  Abdomen soft, nontender.  Lower extremities without edema.  LABORATORY DATA:  Cardiolite stress test, rest and stress are negative for ischemia.  Ejection fraction is 65%.  CK negative x 3.  White count 2.9, hemoglobin 13.1.  Troponin less than 0.3.  ECG with normal sinus rhythm.  Chest x-ray with no pneumothorax.  This was done to confirm the placement of a central line. DD:  11/30/99 TD:  11/30/99 Job: [id number]

## 2010-08-19 ENCOUNTER — Ambulatory Visit (INDEPENDENT_AMBULATORY_CARE_PROVIDER_SITE_OTHER): Payer: Medicare Other | Admitting: Family Medicine

## 2010-08-19 DIAGNOSIS — E538 Deficiency of other specified B group vitamins: Secondary | ICD-10-CM

## 2010-08-19 MED ORDER — CYANOCOBALAMIN 1000 MCG/ML IJ SOLN
1000.0000 ug | Freq: Once | INTRAMUSCULAR | Status: AC
  Administered 2010-08-19: 1000 ug via INTRAMUSCULAR

## 2010-08-19 NOTE — Progress Notes (Signed)
  Subjective:    Patient ID: Debra [name], female    DOB: 02/05/1935, 76 y.o.   MRN: [id number]  HPI A B12 injection was given by our nursing staff to this patient. No physician encounter. No charge for OV.    Review of Systems     Objective:   Physical Exam        Assessment & Plan:

## 2010-09-17 ENCOUNTER — Telehealth: Payer: Self-pay | Admitting: *Deleted

## 2010-09-17 NOTE — Telephone Encounter (Signed)
Pt. Was called and declined to have colon scheduled at this time.

## 2010-09-19 ENCOUNTER — Ambulatory Visit (INDEPENDENT_AMBULATORY_CARE_PROVIDER_SITE_OTHER): Payer: Medicare Other | Admitting: Family Medicine

## 2010-09-19 DIAGNOSIS — E538 Deficiency of other specified B group vitamins: Secondary | ICD-10-CM

## 2010-09-19 DIAGNOSIS — R5383 Other fatigue: Secondary | ICD-10-CM

## 2010-09-19 DIAGNOSIS — R5381 Other malaise: Secondary | ICD-10-CM

## 2010-09-19 MED ORDER — CYANOCOBALAMIN 1000 MCG/ML IJ SOLN
1000.0000 ug | Freq: Once | INTRAMUSCULAR | Status: AC
  Administered 2010-09-19: 1000 ug via INTRAMUSCULAR

## 2010-09-19 NOTE — Progress Notes (Signed)
  Subjective:    Patient ID: Debra [name], female    DOB: [birth year], 76 y.o.   MRN: [id number]  HPI    Review of Systems     Objective:   Physical Exam        Assessment & Plan:  Patient given b-12 at nurse visit today

## 2010-10-21 ENCOUNTER — Ambulatory Visit (INDEPENDENT_AMBULATORY_CARE_PROVIDER_SITE_OTHER): Payer: Medicare Other | Admitting: Family Medicine

## 2010-10-21 DIAGNOSIS — E538 Deficiency of other specified B group vitamins: Secondary | ICD-10-CM

## 2010-10-21 MED ORDER — CYANOCOBALAMIN 1000 MCG/ML IJ SOLN
1000.0000 ug | Freq: Once | INTRAMUSCULAR | Status: AC
  Administered 2010-10-21: 1000 ug via INTRAMUSCULAR

## 2010-10-21 NOTE — Progress Notes (Signed)
Vitamin B12 injection given during nurse visit today. 

## 2010-11-21 ENCOUNTER — Ambulatory Visit (INDEPENDENT_AMBULATORY_CARE_PROVIDER_SITE_OTHER): Payer: Medicare Other | Admitting: *Deleted

## 2010-11-21 DIAGNOSIS — E538 Deficiency of other specified B group vitamins: Secondary | ICD-10-CM

## 2010-11-21 MED ORDER — CYANOCOBALAMIN 1000 MCG/ML IJ SOLN
1000.0000 ug | Freq: Once | INTRAMUSCULAR | Status: AC
  Administered 2010-11-21: 1000 ug via INTRAMUSCULAR

## 2010-11-21 NOTE — Progress Notes (Signed)
B12 injection given during nurse visit today. 

## 2010-12-23 ENCOUNTER — Ambulatory Visit (INDEPENDENT_AMBULATORY_CARE_PROVIDER_SITE_OTHER): Payer: Medicare Other | Admitting: *Deleted

## 2010-12-23 DIAGNOSIS — E538 Deficiency of other specified B group vitamins: Secondary | ICD-10-CM

## 2010-12-23 MED ORDER — CYANOCOBALAMIN 1000 MCG/ML IJ SOLN
1000.0000 ug | Freq: Once | INTRAMUSCULAR | Status: AC
  Administered 2010-12-23: 1000 ug via INTRAMUSCULAR

## 2011-01-27 ENCOUNTER — Ambulatory Visit: Payer: Medicare Other | Admitting: *Deleted

## 2011-01-27 DIAGNOSIS — E538 Deficiency of other specified B group vitamins: Secondary | ICD-10-CM

## 2011-01-27 MED ORDER — CYANOCOBALAMIN 1000 MCG/ML IJ SOLN
1000.0000 ug | Freq: Once | INTRAMUSCULAR | Status: AC
  Administered 2011-01-27: 1000 ug via INTRAMUSCULAR

## 2011-03-04 ENCOUNTER — Ambulatory Visit (INDEPENDENT_AMBULATORY_CARE_PROVIDER_SITE_OTHER): Payer: Medicare Other | Admitting: *Deleted

## 2011-03-04 DIAGNOSIS — E538 Deficiency of other specified B group vitamins: Secondary | ICD-10-CM | POA: Diagnosis not present

## 2011-03-04 MED ORDER — CYANOCOBALAMIN 1000 MCG/ML IJ SOLN
1000.0000 ug | Freq: Once | INTRAMUSCULAR | Status: AC
  Administered 2011-03-04: 1000 ug via INTRAMUSCULAR

## 2011-03-17 ENCOUNTER — Encounter: Payer: Self-pay | Admitting: Family Medicine

## 2011-03-17 ENCOUNTER — Ambulatory Visit (INDEPENDENT_AMBULATORY_CARE_PROVIDER_SITE_OTHER): Payer: Medicare Other | Admitting: Family Medicine

## 2011-03-17 DIAGNOSIS — E538 Deficiency of other specified B group vitamins: Secondary | ICD-10-CM | POA: Diagnosis not present

## 2011-03-17 DIAGNOSIS — E7800 Pure hypercholesterolemia, unspecified: Secondary | ICD-10-CM | POA: Diagnosis not present

## 2011-03-17 DIAGNOSIS — I10 Essential (primary) hypertension: Secondary | ICD-10-CM | POA: Diagnosis not present

## 2011-03-17 DIAGNOSIS — D696 Thrombocytopenia, unspecified: Secondary | ICD-10-CM

## 2011-03-17 MED ORDER — PRAVASTATIN SODIUM 80 MG PO TABS: 40.0000 mg | ORAL_TABLET | Freq: Every day | ORAL | Status: DC

## 2011-03-17 MED ORDER — LISINOPRIL 20 MG PO TABS: 20.0000 mg | ORAL_TABLET | Freq: Every day | ORAL | Status: DC

## 2011-03-17 MED ORDER — VERAPAMIL HCL 240 MG PO TBCR: 240.0000 mg | EXTENDED_RELEASE_TABLET | Freq: Every day | ORAL | Status: DC

## 2011-03-17 NOTE — Assessment & Plan Note (Signed)
Well controlled. Continue current medication.  

## 2011-03-17 NOTE — Assessment & Plan Note (Signed)
Due for -re-eval on 40 mg daily of pravachol.

## 2011-03-17 NOTE — Progress Notes (Signed)
  Subjective:    Patient ID: Debra [name], female    DOB: 04/03/1934, 77 y.o.   MRN: [id number]  HPI  77 year old female here for follow up appt.  She is doing well overall... But husband has had 3 CVA.  Hypertension:  Well controlled on  Lisinopril, verapamil  Using medication without problems or lightheadedness:  Chest pain with exertion: None Edema:None Short of breath:None Average home BPs: Checking daily: 107-142/72-80 pulse 72-92 Was running higher when eating  A lot of pork.. Better now. Other issues:  Elevated Cholesterol: on pravachol 40 mg daily (1/2 tab pf 80 mg) Using medications without problems: Muscle aches: None Diet compliance: Moderate Exercise:walking daily if weather nice., walks dog. Other complaints:  Vit B12 def: Coming monthly for B12. Thrombocytopenia:Due for -re-eval. Followed by Dr. Welton Flakes.. Follow up expected in 07/2011.   Review of Systems  Constitutional: Negative for fever and fatigue.  HENT: Negative for ear pain.   Eyes: Negative for pain.  Respiratory: Negative for chest tightness and shortness of breath.   Cardiovascular: Negative for chest pain, palpitations and leg swelling.  Gastrointestinal: Negative for abdominal pain.  Genitourinary: Negative for dysuria.       Objective:   Physical Exam  Constitutional: Vital signs are normal. She appears well-developed and well-nourished. She is cooperative.  Non-toxic appearance. She does not appear ill. No distress.  HENT:  Head: Normocephalic.  Right Ear: Hearing, tympanic membrane, external ear and ear canal normal. Tympanic membrane is not erythematous, not retracted and not bulging.  Left Ear: Hearing, tympanic membrane, external ear and ear canal normal. Tympanic membrane is not erythematous, not retracted and not bulging.  Nose: No mucosal edema or rhinorrhea. Right sinus exhibits no maxillary sinus tenderness and no frontal sinus tenderness. Left sinus exhibits no maxillary sinus  tenderness and no frontal sinus tenderness.  Mouth/Throat: Uvula is midline, oropharynx is clear and moist and mucous membranes are normal.  Eyes: Conjunctivae, EOM and lids are normal. Pupils are equal, round, and reactive to light. No foreign bodies found.  Neck: Trachea normal and normal range of motion. Neck supple. Carotid bruit is not present. No mass and no thyromegaly present.  Cardiovascular: Normal rate, regular rhythm, S1 normal, S2 normal, normal heart sounds, intact distal pulses and normal pulses.  Exam reveals no gallop and no friction rub.   No murmur heard. Pulmonary/Chest: Effort normal and breath sounds normal. Not tachypneic. No respiratory distress. She has no decreased breath sounds. She has no wheezes. She has no rhonchi. She has no rales.  Abdominal: Soft. Normal appearance and bowel sounds are normal. There is no tenderness.  Neurological: She is alert.  Skin: Skin is warm, dry and intact. No rash noted.  Psychiatric: Her speech is normal and behavior is normal. Judgment and thought content normal. Her mood appears not anxious. Cognition and memory are normal. She does not exhibit a depressed mood.          Assessment & Plan:  Has had flu shot

## 2011-03-17 NOTE — Patient Instructions (Addendum)
Return in 6 months for annual medicare wellness exam.   Return in next few days for fasting labs.

## 2011-03-17 NOTE — Assessment & Plan Note (Signed)
Due for re-eval. 

## 2011-03-17 NOTE — Assessment & Plan Note (Signed)
Due for -re-eval on monthly B12. Forward results to Dr. Welton Flakes.

## 2011-03-18 ENCOUNTER — Other Ambulatory Visit (INDEPENDENT_AMBULATORY_CARE_PROVIDER_SITE_OTHER): Payer: Medicare Other

## 2011-03-18 DIAGNOSIS — D696 Thrombocytopenia, unspecified: Secondary | ICD-10-CM

## 2011-03-18 DIAGNOSIS — E7800 Pure hypercholesterolemia, unspecified: Secondary | ICD-10-CM

## 2011-03-18 DIAGNOSIS — E538 Deficiency of other specified B group vitamins: Secondary | ICD-10-CM | POA: Diagnosis not present

## 2011-03-18 LAB — COMPREHENSIVE METABOLIC PANEL
ALT: 16 U/L (ref 0–35)
AST: 18 U/L (ref 0–37)
Alkaline Phosphatase: 58 U/L (ref 39–117)
Calcium: 9.3 mg/dL (ref 8.4–10.5)
Chloride: 107 mEq/L (ref 96–112)
Creatinine, Ser: 0.8 mg/dL (ref 0.4–1.2)
Potassium: 4.8 mEq/L (ref 3.5–5.1)

## 2011-03-18 LAB — LIPID PANEL
LDL Cholesterol: 98 mg/dL (ref 0–99)
Total CHOL/HDL Ratio: 4
VLDL: 34.8 mg/dL (ref 0.0–40.0)

## 2011-03-18 LAB — CBC WITH DIFFERENTIAL/PLATELET
Basophils Relative: 0.6 % (ref 0.0–3.0)
Eosinophils Absolute: 0.1 10*3/uL (ref 0.0–0.7)
Eosinophils Relative: 2.3 % (ref 0.0–5.0)
HCT: 41.8 % (ref 36.0–46.0)
Lymphs Abs: 1.1 10*3/uL (ref 0.7–4.0)
MCHC: 34.5 g/dL (ref 30.0–36.0)
MCV: 88.8 fl (ref 78.0–100.0)
Monocytes Absolute: 0.3 10*3/uL (ref 0.1–1.0)
Neutro Abs: 2.7 10*3/uL (ref 1.4–7.7)
Neutrophils Relative %: 65.5 % (ref 43.0–77.0)
RBC: 4.71 Mil/uL (ref 3.87–5.11)
WBC: 4.2 10*3/uL — ABNORMAL LOW (ref 4.5–10.5)

## 2011-03-19 ENCOUNTER — Encounter: Payer: Self-pay | Admitting: *Deleted

## 2011-03-23 DIAGNOSIS — H4011X0 Primary open-angle glaucoma, stage unspecified: Secondary | ICD-10-CM | POA: Diagnosis not present

## 2011-03-23 DIAGNOSIS — H409 Unspecified glaucoma: Secondary | ICD-10-CM | POA: Diagnosis not present

## 2011-04-07 ENCOUNTER — Ambulatory Visit (INDEPENDENT_AMBULATORY_CARE_PROVIDER_SITE_OTHER): Payer: Medicare Other | Admitting: *Deleted

## 2011-04-07 DIAGNOSIS — E538 Deficiency of other specified B group vitamins: Secondary | ICD-10-CM | POA: Diagnosis not present

## 2011-04-07 MED ORDER — CYANOCOBALAMIN 1000 MCG/ML IJ SOLN
1000.0000 ug | Freq: Once | INTRAMUSCULAR | Status: AC
  Administered 2011-04-07: 1000 ug via INTRAMUSCULAR

## 2011-05-05 ENCOUNTER — Ambulatory Visit (INDEPENDENT_AMBULATORY_CARE_PROVIDER_SITE_OTHER): Payer: Medicare Other | Admitting: *Deleted

## 2011-05-05 DIAGNOSIS — E538 Deficiency of other specified B group vitamins: Secondary | ICD-10-CM | POA: Diagnosis not present

## 2011-05-05 MED ORDER — CYANOCOBALAMIN 1000 MCG/ML IJ SOLN
1000.0000 ug | Freq: Once | INTRAMUSCULAR | Status: AC
  Administered 2011-05-05: 1000 ug via INTRAMUSCULAR

## 2011-06-02 ENCOUNTER — Other Ambulatory Visit: Payer: Self-pay | Admitting: Family Medicine

## 2011-06-05 ENCOUNTER — Ambulatory Visit (INDEPENDENT_AMBULATORY_CARE_PROVIDER_SITE_OTHER): Payer: Medicare Other | Admitting: *Deleted

## 2011-06-05 DIAGNOSIS — E538 Deficiency of other specified B group vitamins: Secondary | ICD-10-CM | POA: Diagnosis not present

## 2011-06-05 MED ORDER — CYANOCOBALAMIN 1000 MCG/ML IJ SOLN
1000.0000 ug | Freq: Once | INTRAMUSCULAR | Status: AC
  Administered 2011-06-05: 1000 ug via INTRAMUSCULAR

## 2011-06-17 ENCOUNTER — Encounter: Payer: Self-pay | Admitting: Oncology

## 2011-06-17 ENCOUNTER — Telehealth: Payer: Self-pay

## 2011-06-17 ENCOUNTER — Ambulatory Visit (HOSPITAL_BASED_OUTPATIENT_CLINIC_OR_DEPARTMENT_OTHER): Payer: Medicare Other | Admitting: Oncology

## 2011-06-17 ENCOUNTER — Other Ambulatory Visit (HOSPITAL_BASED_OUTPATIENT_CLINIC_OR_DEPARTMENT_OTHER): Payer: Medicare Other | Admitting: Lab

## 2011-06-17 ENCOUNTER — Telehealth: Payer: Self-pay | Admitting: Oncology

## 2011-06-17 VITALS — BP 170/92 | HR 106 | Temp 98.8°F | Ht 63.0 in | Wt 191.1 lb

## 2011-06-17 DIAGNOSIS — D510 Vitamin B12 deficiency anemia due to intrinsic factor deficiency: Secondary | ICD-10-CM | POA: Diagnosis not present

## 2011-06-17 DIAGNOSIS — E538 Deficiency of other specified B group vitamins: Secondary | ICD-10-CM

## 2011-06-17 DIAGNOSIS — D72819 Decreased white blood cell count, unspecified: Secondary | ICD-10-CM | POA: Diagnosis not present

## 2011-06-17 DIAGNOSIS — D518 Other vitamin B12 deficiency anemias: Secondary | ICD-10-CM | POA: Diagnosis not present

## 2011-06-17 LAB — CBC WITH DIFFERENTIAL/PLATELET
BASO%: 0.9 % (ref 0.0–2.0)
EOS%: 2.3 % (ref 0.0–7.0)
Eosinophils Absolute: 0.1 10*3/uL (ref 0.0–0.5)
LYMPH%: 25.5 % (ref 14.0–49.7)
MCH: 30.1 pg (ref 25.1–34.0)
MCHC: 34 g/dL (ref 31.5–36.0)
MCV: 88.6 fL (ref 79.5–101.0)
MONO%: 5.5 % (ref 0.0–14.0)
NEUT#: 2.9 10*3/uL (ref 1.5–6.5)
Platelets: 174 10*3/uL (ref 145–400)
RBC: 4.9 10*6/uL (ref 3.70–5.45)
RDW: 13.8 % (ref 11.2–14.5)
nRBC: 0 % (ref 0–0)

## 2011-06-17 NOTE — Progress Notes (Signed)
OFFICE PROGRESS NOTE  CC  Kerby Nora, MD, MD [address] [place] [address]  DIAGNOSIS: 77 year old female with B12 deficiency and mild leukopenia  PRIOR THERAPY:  #1 leukopenia: Observation.  #2 B12 deficiency: Patient has been receiving monthly B12 injections at the [facility].  CURRENT THERAPY:Vitamin B12 1000 mcg q. Monthly by Dr. Albertina Senegal office  INTERVAL HISTORY: Debra [name] 77 y.o. female returns for Followup visit today. Overall she is doing well she receives her monthly B12 injections at [facility]. She is tolerating it well. Although she does tell me she bruises at the site of the injections. She has not had any fevers chills night sweats headaches she does have some shortness of breath and fatigue. She also tells me that sometimes she becomes weak in her legs this episode happened about a year ago. Patient is continuing to wear hearing aids. She has no nausea or vomiting no bleeding problems remainder of the 10 point review of systems is negative.  MEDICAL HISTORY: Past Medical History  Diagnosis Date  . Pure hypercholesterolemia   . Degeneration of lumbar or lumbosacral intervertebral disc   . Lumbago   . Female stress incontinence   . Paroxysmal supraventricular tachycardia   . Unspecified glaucoma   . Diverticulosis of colon with hemorrhage     ALLERGIES:  is allergic to codeine phosphate.  MEDICATIONS:  Current Outpatient Prescriptions  Medication Sig Dispense Refill  . brimonidine (ALPHAGAN) 0.2 % ophthalmic solution Place 1 drop into both eyes 2 (two) times daily.      Marland Kitchen latanoprost (XALATAN) 0.005 % ophthalmic solution       . lisinopril (PRINIVIL,ZESTRIL) 20 MG tablet Take 1 tablet (20 mg total) by mouth daily.  90 tablet  3  . pravastatin (PRAVACHOL) 80 MG tablet TAKE 1/2 TABLET BY MOUTH ONCE A DAY  90 tablet  2  . solifenacin (VESICARE) 5 MG tablet Take 10 mg by mouth daily.      . timolol  (TIMOPTIC) 0.5 % ophthalmic solution       . verapamil (CALAN-SR) 240 MG CR tablet Take 1 tablet (240 mg total) by mouth at bedtime.  90 tablet  3    SURGICAL HISTORY:  Past Surgical History  Procedure Date  . Abdominal hysterectomy     REVIEW OF SYSTEMS:  Pertinent items are noted in HPI.   PHYSICAL EXAMINATION:  HEENT exam EOMI PERRLA sclerae anicteric no conjunctival pallor oral mucosa is moist neck is supple lungs: Clear bilaterally to auscultation and percussion. Cardiovascular: Regular rate rhythm. Abdomen: Soft nontender nondistended bowel sounds are present no organomegaly. Extremities: No edema clubbing or cyanosis. Neuro: Patient is alert oriented x3 DTRs are +4 motor and sensory is intact strength is symmetrical in upper and lower extremities. ECOG PERFORMANCE STATUS: 1 - Symptomatic but completely ambulatory  Blood pressure 170/92, pulse 106, temperature 98.8 F (37.1 C), height 5\' 3"  (1.6 m), weight 191 lb 1.6 oz (86.682 kg).  LABORATORY DATA: Lab Results  Component Value Date   WBC 4.4 06/17/2011   HGB 14.8 06/17/2011   HCT 43.4 06/17/2011   MCV 88.6 06/17/2011   PLT 174 06/17/2011      Chemistry      Component Value Date/Time   NA 141 03/18/2011 0935   NA 140 06/04/2009 1057   K 4.8 03/18/2011 0935   K 4.2 06/04/2009 1057   CL 107 03/18/2011 0935   CL 103 06/04/2009 1057  CO2 28 03/18/2011 0935   CO2 29 06/04/2009 1057   BUN 16 03/18/2011 0935   BUN 17 06/04/2009 1057   CREATININE 0.8 03/18/2011 0935   CREATININE 0.6 06/04/2009 1057      Component Value Date/Time   CALCIUM 9.3 03/18/2011 0935   CALCIUM 9.7 06/04/2009 1057   ALKPHOS 58 03/18/2011 0935   ALKPHOS 101* 06/04/2009 1057   AST 18 03/18/2011 0935   AST 36 06/04/2009 1057   ALT 16 03/18/2011 0935   BILITOT 0.9 03/18/2011 0935   BILITOT 0.50 06/04/2009 1057       RADIOGRAPHIC STUDIES:  No results found.  ASSESSMENT: 77 year old female with B12 deficiency and mild leukopenia. Her CBC was reviewed today her CBC in  fact looks terrific platelets white count and hemoglobin are normal. B12 studies are pending to   PLAN: I will continue to follow the patient on a yearly basis. She will continue to be seen by Dr. Ermalene Searing at [facility] for ongoing B12 injections. Patient however knows to call me with any problems or any concerns in the interim   All questions were answered. The patient knows to call the clinic with any problems, questions or concerns. We can certainly see the patient much sooner if necessary.  I spent 20 minutes counseling the patient face to face. The total time spent in the appointment was 30 minutes.    Drue Second, MD Medical/Oncology [facility] [phone] (beeper) [phone] (Office)  06/17/2011, 2:14 PM

## 2011-06-17 NOTE — Patient Instructions (Signed)
1. You are doing well. Your blood work looks good today.  2. i will see you back in 1 year

## 2011-06-17 NOTE — Telephone Encounter (Signed)
gve the pt her April 2014 appt calendar °

## 2011-06-17 NOTE — Telephone Encounter (Signed)
Pt said one of her visits had not been filed for and she is getting bill. I advised pt to call [phone].

## 2011-06-18 ENCOUNTER — Telehealth: Payer: Self-pay | Admitting: *Deleted

## 2011-06-18 NOTE — Telephone Encounter (Signed)
Message copied by Savita Runner, Gerald Leitz on Thu Jun 18, 2011 12:19 PM ------      Message from: Debra [name]      Created: Thu Jun 18, 2011 12:01 PM       Call patient: tell her B12 looks good

## 2011-06-18 NOTE — Telephone Encounter (Signed)
Per MD, notified pt [facility] B12 looks. Pt verbalized understanding.

## 2011-07-07 ENCOUNTER — Ambulatory Visit (INDEPENDENT_AMBULATORY_CARE_PROVIDER_SITE_OTHER): Payer: Medicare Other | Admitting: *Deleted

## 2011-07-07 DIAGNOSIS — E538 Deficiency of other specified B group vitamins: Secondary | ICD-10-CM

## 2011-07-07 MED ORDER — CYANOCOBALAMIN 1000 MCG/ML IJ SOLN
1000.0000 ug | Freq: Once | INTRAMUSCULAR | Status: AC
  Administered 2011-07-07: 1000 ug via INTRAMUSCULAR

## 2011-08-11 ENCOUNTER — Ambulatory Visit (INDEPENDENT_AMBULATORY_CARE_PROVIDER_SITE_OTHER): Payer: Medicare Other | Admitting: *Deleted

## 2011-08-11 DIAGNOSIS — E538 Deficiency of other specified B group vitamins: Secondary | ICD-10-CM

## 2011-08-11 MED ORDER — CYANOCOBALAMIN 1000 MCG/ML IJ SOLN
1000.0000 ug | Freq: Once | INTRAMUSCULAR | Status: AC
  Administered 2011-08-11: 1000 ug via INTRAMUSCULAR

## 2011-09-07 DIAGNOSIS — H4011X0 Primary open-angle glaucoma, stage unspecified: Secondary | ICD-10-CM | POA: Diagnosis not present

## 2011-09-07 DIAGNOSIS — H409 Unspecified glaucoma: Secondary | ICD-10-CM | POA: Diagnosis not present

## 2011-09-15 ENCOUNTER — Ambulatory Visit (INDEPENDENT_AMBULATORY_CARE_PROVIDER_SITE_OTHER): Payer: Medicare Other

## 2011-09-15 DIAGNOSIS — E538 Deficiency of other specified B group vitamins: Secondary | ICD-10-CM | POA: Diagnosis not present

## 2011-09-15 MED ORDER — CYANOCOBALAMIN 1000 MCG/ML IJ SOLN
1000.0000 ug | Freq: Once | INTRAMUSCULAR | Status: AC
  Administered 2011-09-15: 1000 ug via INTRAMUSCULAR

## 2011-10-20 ENCOUNTER — Ambulatory Visit (INDEPENDENT_AMBULATORY_CARE_PROVIDER_SITE_OTHER): Payer: Medicare Other | Admitting: *Deleted

## 2011-10-20 DIAGNOSIS — E538 Deficiency of other specified B group vitamins: Secondary | ICD-10-CM | POA: Diagnosis not present

## 2011-10-20 MED ORDER — CYANOCOBALAMIN 1000 MCG/ML IJ SOLN
1000.0000 ug | Freq: Once | INTRAMUSCULAR | Status: AC
  Administered 2011-10-20: 1000 ug via INTRAMUSCULAR

## 2011-11-24 ENCOUNTER — Ambulatory Visit (INDEPENDENT_AMBULATORY_CARE_PROVIDER_SITE_OTHER): Payer: Medicare Other

## 2011-11-24 ENCOUNTER — Telehealth: Payer: Self-pay

## 2011-11-24 DIAGNOSIS — E538 Deficiency of other specified B group vitamins: Secondary | ICD-10-CM | POA: Diagnosis not present

## 2011-11-24 MED ORDER — CYANOCOBALAMIN 1000 MCG/ML IJ SOLN
1000.0000 ug | Freq: Once | INTRAMUSCULAR | Status: AC
  Administered 2011-11-24: 1000 ug via INTRAMUSCULAR

## 2011-11-24 NOTE — Telephone Encounter (Signed)
Pt came for vit b 12 injection; last 2 months pt has been losing hair. Every time she combs hair loses about hair ball size of quarter. Beginning to see scalp and pt has always had very thick hair. Pt wonders if B 12 can cause hair loss?[facility].Please advise.

## 2011-11-24 NOTE — Telephone Encounter (Signed)
Notify pt that this is not a common Se of B12. If interested we can refer to derm for eval of hair loss.

## 2011-11-25 NOTE — Telephone Encounter (Signed)
Left message on patient vm to call back and let PCP know if she is interested in derm referral for hair loss

## 2011-12-29 ENCOUNTER — Ambulatory Visit (INDEPENDENT_AMBULATORY_CARE_PROVIDER_SITE_OTHER): Payer: Medicare Other | Admitting: *Deleted

## 2011-12-29 DIAGNOSIS — E538 Deficiency of other specified B group vitamins: Secondary | ICD-10-CM | POA: Diagnosis not present

## 2011-12-29 MED ORDER — CYANOCOBALAMIN 1000 MCG/ML IJ SOLN
1000.0000 ug | Freq: Once | INTRAMUSCULAR | Status: AC
  Administered 2011-12-29: 1000 ug via INTRAMUSCULAR

## 2012-01-12 ENCOUNTER — Telehealth: Payer: Self-pay | Admitting: Oncology

## 2012-01-12 NOTE — Telephone Encounter (Signed)
S/w the pt's husband regarding the cancelled April appts that have been r/s to 06/29/2012. Mailed the pt an appt calendar with the appt changes due to the md's schedule.

## 2012-01-20 DIAGNOSIS — H409 Unspecified glaucoma: Secondary | ICD-10-CM | POA: Diagnosis not present

## 2012-01-20 DIAGNOSIS — H4011X0 Primary open-angle glaucoma, stage unspecified: Secondary | ICD-10-CM | POA: Diagnosis not present

## 2012-01-29 ENCOUNTER — Ambulatory Visit (INDEPENDENT_AMBULATORY_CARE_PROVIDER_SITE_OTHER): Payer: Medicare Other | Admitting: *Deleted

## 2012-01-29 DIAGNOSIS — E538 Deficiency of other specified B group vitamins: Secondary | ICD-10-CM | POA: Diagnosis not present

## 2012-01-29 MED ORDER — CYANOCOBALAMIN 1000 MCG/ML IJ SOLN
1000.0000 ug | Freq: Once | INTRAMUSCULAR | Status: AC
  Administered 2012-01-29: 1000 ug via INTRAMUSCULAR

## 2012-03-01 ENCOUNTER — Ambulatory Visit (INDEPENDENT_AMBULATORY_CARE_PROVIDER_SITE_OTHER): Payer: Medicare Other | Admitting: *Deleted

## 2012-03-01 DIAGNOSIS — E538 Deficiency of other specified B group vitamins: Secondary | ICD-10-CM | POA: Diagnosis not present

## 2012-03-01 MED ORDER — CYANOCOBALAMIN 1000 MCG/ML IJ SOLN
1000.0000 ug | Freq: Once | INTRAMUSCULAR | Status: AC
  Administered 2012-03-01: 1000 ug via INTRAMUSCULAR

## 2012-03-03 ENCOUNTER — Other Ambulatory Visit: Payer: Self-pay | Admitting: Family Medicine

## 2012-03-03 NOTE — Telephone Encounter (Signed)
Patient not seen in over a year ok to refill?

## 2012-03-24 ENCOUNTER — Ambulatory Visit (INDEPENDENT_AMBULATORY_CARE_PROVIDER_SITE_OTHER): Payer: Medicare Other | Admitting: Internal Medicine

## 2012-03-24 ENCOUNTER — Encounter: Payer: Self-pay | Admitting: Internal Medicine

## 2012-03-24 VITALS — BP 180/90 | HR 50 | Temp 98.1°F

## 2012-03-24 DIAGNOSIS — R42 Dizziness and giddiness: Secondary | ICD-10-CM | POA: Diagnosis not present

## 2012-03-24 LAB — CBC WITH DIFFERENTIAL/PLATELET
Basophils Relative: 0.2 % (ref 0.0–3.0)
Eosinophils Relative: 0.1 % (ref 0.0–5.0)
HCT: 47 % — ABNORMAL HIGH (ref 36.0–46.0)
Lymphs Abs: 0.5 10*3/uL — ABNORMAL LOW (ref 0.7–4.0)
MCHC: 33.9 g/dL (ref 30.0–36.0)
MCV: 86.3 fl (ref 78.0–100.0)
Monocytes Absolute: 0.2 10*3/uL (ref 0.1–1.0)
Neutro Abs: 5.3 10*3/uL (ref 1.4–7.7)
RBC: 5.45 Mil/uL — ABNORMAL HIGH (ref 3.87–5.11)
WBC: 6.1 10*3/uL (ref 4.5–10.5)

## 2012-03-24 LAB — BASIC METABOLIC PANEL
BUN: 13 mg/dL (ref 6–23)
Calcium: 9.8 mg/dL (ref 8.4–10.5)
Creatinine, Ser: 0.7 mg/dL (ref 0.4–1.2)
GFR: 88 mL/min (ref >60.00)
Glucose, Bld: 150 mg/dL — ABNORMAL HIGH (ref 70–99)

## 2012-03-24 LAB — HEPATIC FUNCTION PANEL
AST: 18 U/L (ref 0–37)
Albumin: 4.1 g/dL (ref 3.5–5.2)

## 2012-03-24 MED ORDER — MECLIZINE HCL 25 MG PO TABS: 25.0000 mg | ORAL_TABLET | Freq: Three times a day (TID) | ORAL | Status: DC | PRN

## 2012-03-24 NOTE — Patient Instructions (Signed)
Please call [phone] for emergency evaluation if she worsens

## 2012-03-24 NOTE — Progress Notes (Signed)
  Subjective:    Patient ID: Debra [name], female    DOB: 08/26/1934, 78 y.o.   MRN: [id number]  HPI Here with husband  Vomiting, dizzy, can't walk Started yesterday No suspect foods  No fever No abdominal pain No blood in vomitus Vomits whenever she sits up---okay lying down. Does have spinning sensation  Has drank a little ginger ale No headache Can't walk---has "drunk feeling"  Has had similar spells of "inner ear" problems in the past  Current Outpatient Prescriptions on File Prior to Visit  Medication Sig Dispense Refill  . brimonidine (ALPHAGAN) 0.2 % ophthalmic solution Place 1 drop into both eyes 2 (two) times daily.      Marland Kitchen latanoprost (XALATAN) 0.005 % ophthalmic solution       . lisinopril (PRINIVIL,ZESTRIL) 20 MG tablet TAKE 1 TABLET EVERY DAY  90 tablet  0  . pravastatin (PRAVACHOL) 80 MG tablet TAKE 1/2 TABLET BY MOUTH ONCE A DAY  90 tablet  2  . solifenacin (VESICARE) 5 MG tablet Take 10 mg by mouth daily.      . timolol (TIMOPTIC) 0.5 % ophthalmic solution       . verapamil (CALAN-SR) 240 MG CR tablet TAKE 1 TABLET AT BEDTIME  90 tablet  0    Allergies  Allergen Reactions  . Codeine Phosphate     REACTION: unspecified    Past Medical History  Diagnosis Date  . Pure hypercholesterolemia   . Degeneration of lumbar or lumbosacral intervertebral disc   . Lumbago   . Female stress incontinence   . Paroxysmal supraventricular tachycardia   . Unspecified glaucoma(365.9)   . Diverticulosis of colon with hemorrhage     Past Surgical History  Procedure Date  . Abdominal hysterectomy     Family History  Problem Relation Age of Onset  . Aneurysm Mother   . Alzheimer's disease Mother   . Cancer Father     prostate  . Cancer Sister   . Arthritis Sister   . Heart disease Brother   . Hypertension Brother   . Glaucoma Brother     History   Social History  . Marital Status: Married    Spouse Name: N/A    Number of Children: N/A  . Years of  Education: N/A   Occupational History  . raised tobacco    Social History Main Topics  . Smoking status: Never Smoker   . Smokeless tobacco: Not on file  . Alcohol Use: No  . Drug Use: No  . Sexually Active: Not Currently   Other Topics Concern  . Not on file   Social History Narrative  . No narrative on file   Review of Systems No focal weakness---just some generalized weakness No dysphagia or aphasia No facial asymmetry    Objective:   Physical Exam  Constitutional: She appears well-developed and well-nourished. No distress.  Eyes: EOM are normal. Pupils are equal, round, and reactive to light.       No nystagmus  Neck: Normal range of motion. No thyromegaly present.  Musculoskeletal: She exhibits no edema.  Lymphadenopathy:    She has no cervical adenopathy.  Neurological:       Face symmetric No focal weakness Normal tone Very small, halting steps. ?ataxic          Assessment & Plan:

## 2012-03-24 NOTE — Assessment & Plan Note (Signed)
Has a history of vestibular vertigo but this is some different since she can't really walk zofran given here Will Rx meclizine Will check brain stem MRI to rule out stroke or other lesion

## 2012-03-25 ENCOUNTER — Ambulatory Visit
Admission: RE | Admit: 2012-03-25 | Discharge: 2012-03-25 | Disposition: A | Discharge status: Home / Self Care | Payer: Medicare Other | Source: Ambulatory Visit | Attending: Internal Medicine | Admitting: Internal Medicine

## 2012-03-25 DIAGNOSIS — R42 Dizziness and giddiness: Secondary | ICD-10-CM

## 2012-03-25 DIAGNOSIS — D320 Benign neoplasm of cerebral meninges: Secondary | ICD-10-CM | POA: Diagnosis not present

## 2012-03-25 MED ORDER — GADOBENATE DIMEGLUMINE 529 MG/ML IV SOLN
17.0000 mL | Freq: Once | INTRAVENOUS | Status: AC | PRN
  Administered 2012-03-25: 17 mL via INTRAVENOUS

## 2012-03-26 ENCOUNTER — Telehealth: Payer: Self-pay | Admitting: Internal Medicine

## 2012-03-26 DIAGNOSIS — D333 Benign neoplasm of cranial nerves: Secondary | ICD-10-CM

## 2012-03-26 NOTE — Telephone Encounter (Signed)
Discussed the results of the MRI with patient and her husband She is much better---not vomiting and able to eat----but still unstable on her feet and dizzy  Discussed that she has a benign tumor that might need surgery if her symptoms persist Will set up evaluation with a neurosurgeon

## 2012-03-28 NOTE — Telephone Encounter (Signed)
Referral faxed to [facility], waiting for appt to be made.

## 2012-03-29 DIAGNOSIS — D320 Benign neoplasm of cerebral meninges: Secondary | ICD-10-CM | POA: Diagnosis not present

## 2012-03-29 DIAGNOSIS — D332 Benign neoplasm of brain, unspecified: Secondary | ICD-10-CM | POA: Diagnosis not present

## 2012-04-05 ENCOUNTER — Ambulatory Visit (INDEPENDENT_AMBULATORY_CARE_PROVIDER_SITE_OTHER): Payer: Medicare Other | Admitting: *Deleted

## 2012-04-05 DIAGNOSIS — E538 Deficiency of other specified B group vitamins: Secondary | ICD-10-CM | POA: Diagnosis not present

## 2012-04-05 MED ORDER — CYANOCOBALAMIN 1000 MCG/ML IJ SOLN
1000.0000 ug | Freq: Once | INTRAMUSCULAR | Status: AC
  Administered 2012-04-05: 1000 ug via INTRAMUSCULAR

## 2012-04-21 DIAGNOSIS — D332 Benign neoplasm of brain, unspecified: Secondary | ICD-10-CM | POA: Diagnosis not present

## 2012-04-29 ENCOUNTER — Encounter: Payer: Self-pay | Admitting: Family Medicine

## 2012-04-29 DIAGNOSIS — D329 Benign neoplasm of meninges, unspecified: Secondary | ICD-10-CM | POA: Insufficient documentation

## 2012-05-03 ENCOUNTER — Ambulatory Visit (INDEPENDENT_AMBULATORY_CARE_PROVIDER_SITE_OTHER): Payer: Medicare Other | Admitting: *Deleted

## 2012-05-03 DIAGNOSIS — E538 Deficiency of other specified B group vitamins: Secondary | ICD-10-CM | POA: Diagnosis not present

## 2012-05-03 MED ORDER — CYANOCOBALAMIN 1000 MCG/ML IJ SOLN
1000.0000 ug | Freq: Once | INTRAMUSCULAR | Status: AC
  Administered 2012-05-03: 1000 ug via INTRAMUSCULAR

## 2012-05-11 DIAGNOSIS — H4011X0 Primary open-angle glaucoma, stage unspecified: Secondary | ICD-10-CM | POA: Diagnosis not present

## 2012-05-11 DIAGNOSIS — H409 Unspecified glaucoma: Secondary | ICD-10-CM | POA: Diagnosis not present

## 2012-05-24 DIAGNOSIS — D332 Benign neoplasm of brain, unspecified: Secondary | ICD-10-CM | POA: Diagnosis not present

## 2012-06-03 ENCOUNTER — Other Ambulatory Visit: Payer: Self-pay | Admitting: Neurosurgery

## 2012-06-03 DIAGNOSIS — D496 Neoplasm of unspecified behavior of brain: Secondary | ICD-10-CM

## 2012-06-07 ENCOUNTER — Ambulatory Visit: Payer: Medicare Other

## 2012-06-07 ENCOUNTER — Ambulatory Visit (INDEPENDENT_AMBULATORY_CARE_PROVIDER_SITE_OTHER): Payer: Medicare Other | Admitting: *Deleted

## 2012-06-07 DIAGNOSIS — E538 Deficiency of other specified B group vitamins: Secondary | ICD-10-CM | POA: Diagnosis not present

## 2012-06-07 MED ORDER — CYANOCOBALAMIN 1000 MCG/ML IJ SOLN
1000.0000 ug | Freq: Once | INTRAMUSCULAR | Status: AC
  Administered 2012-06-07: 1000 ug via INTRAMUSCULAR

## 2012-06-10 ENCOUNTER — Other Ambulatory Visit: Payer: Medicare Other

## 2012-06-16 ENCOUNTER — Other Ambulatory Visit: Payer: Self-pay | Admitting: Family Medicine

## 2012-06-17 ENCOUNTER — Ambulatory Visit: Payer: Medicare Other | Admitting: Oncology

## 2012-06-17 ENCOUNTER — Other Ambulatory Visit: Payer: Medicare Other | Admitting: Lab

## 2012-06-29 ENCOUNTER — Ambulatory Visit: Payer: Medicare Other | Admitting: Oncology

## 2012-06-29 ENCOUNTER — Other Ambulatory Visit: Payer: Medicare Other | Admitting: Lab

## 2012-07-01 ENCOUNTER — Encounter: Payer: Self-pay | Admitting: Oncology

## 2012-07-01 ENCOUNTER — Ambulatory Visit (HOSPITAL_BASED_OUTPATIENT_CLINIC_OR_DEPARTMENT_OTHER): Payer: Medicare Other

## 2012-07-01 ENCOUNTER — Telehealth: Payer: Self-pay | Admitting: Oncology

## 2012-07-01 ENCOUNTER — Telehealth: Payer: Self-pay | Admitting: Family Medicine

## 2012-07-01 ENCOUNTER — Ambulatory Visit (HOSPITAL_BASED_OUTPATIENT_CLINIC_OR_DEPARTMENT_OTHER): Payer: Medicare Other | Admitting: Oncology

## 2012-07-01 ENCOUNTER — Other Ambulatory Visit (HOSPITAL_BASED_OUTPATIENT_CLINIC_OR_DEPARTMENT_OTHER): Payer: Medicare Other | Admitting: Lab

## 2012-07-01 VITALS — BP 165/101 | HR 101 | Temp 98.3°F | Resp 20 | Ht 63.0 in | Wt 188.9 lb

## 2012-07-01 DIAGNOSIS — I10 Essential (primary) hypertension: Secondary | ICD-10-CM

## 2012-07-01 DIAGNOSIS — E559 Vitamin D deficiency, unspecified: Secondary | ICD-10-CM

## 2012-07-01 DIAGNOSIS — E538 Deficiency of other specified B group vitamins: Secondary | ICD-10-CM

## 2012-07-01 DIAGNOSIS — D72819 Decreased white blood cell count, unspecified: Secondary | ICD-10-CM

## 2012-07-01 DIAGNOSIS — E7800 Pure hypercholesterolemia, unspecified: Secondary | ICD-10-CM

## 2012-07-01 LAB — CBC WITH DIFFERENTIAL/PLATELET
BASO%: 1.1 % (ref 0.0–2.0)
EOS%: 3.9 % (ref 0.0–7.0)
HCT: 44.3 % (ref 34.8–46.6)
MCH: 29.8 pg (ref 25.1–34.0)
MCHC: 34.1 g/dL (ref 31.5–36.0)
NEUT%: 58.4 % (ref 38.4–76.8)
RDW: 13.7 % (ref 11.2–14.5)
lymph#: 1.1 10*3/uL (ref 0.9–3.3)

## 2012-07-01 MED ORDER — CYANOCOBALAMIN 1000 MCG/ML IJ SOLN
1000.0000 ug | Freq: Once | INTRAMUSCULAR | Status: AC
  Administered 2012-07-01: 1000 ug via INTRAMUSCULAR

## 2012-07-01 NOTE — Progress Notes (Signed)
OFFICE PROGRESS NOTE  CC  Kerby Nora, MD [address] [place] [address]  DIAGNOSIS: 78 year old female with B12 deficiency and mild leukopenia  PRIOR THERAPY:  #1 leukopenia: Observation.  #2 B12 deficiency: Patient has been receiving monthly B12 injections at the [facility]. She no longer can receive those day or so she will now start coming to the cancer Center for injections on a monthly basis.  CURRENT THERAPY:Vitamin B12 1000 mcg q. Monthly INTERVAL HISTORY: Debra [name] 78 y.o. female returns for Followup visit today. Overall  patient is tired.  Although she does tell me she bruises at the site of the injections. She has not had any fevers chills night sweats headaches she does have some shortness of breath and fatigue. She also tells me that sometimes she becomes weak in her legs this episode happened about a year ago. Patient is continuing to wear hearing aids. She has no nausea or vomiting no bleeding problems remainder of the 10 point review of systems is negative.  MEDICAL HISTORY: Past Medical History  Diagnosis Date  . Pure hypercholesterolemia   . Degeneration of lumbar or lumbosacral intervertebral disc   . Lumbago   . Female stress incontinence   . Paroxysmal supraventricular tachycardia   . Unspecified glaucoma(365.9)   . Diverticulosis of colon with hemorrhage     ALLERGIES:  is allergic to codeine phosphate and latex.  MEDICATIONS:  Current Outpatient Prescriptions  Medication Sig Dispense Refill  . brimonidine (ALPHAGAN) 0.2 % ophthalmic solution Place 1 drop into both eyes 2 (two) times daily.      Marland Kitchen latanoprost (XALATAN) 0.005 % ophthalmic solution       . lisinopril (PRINIVIL,ZESTRIL) 20 MG tablet TAKE 1 TABLET BY MOUTH DAILY  90 tablet  0  . pravastatin (PRAVACHOL) 80 MG tablet TAKE 1/2 TABLET BY MOUTH ONCE A DAY  90 tablet  2  . timolol (BETIMOL) 0.5 % ophthalmic solution Place 1 drop into  both eyes 2 (two) times daily.      . verapamil (CALAN-SR) 240 MG CR tablet TAKE 1 TABLET BY MOUTH AT BEDTIME  90 tablet  0  . meclizine (ANTIVERT) 25 MG tablet Take 1 tablet (25 mg total) by mouth 3 (three) times daily as needed.  90 tablet  1  . solifenacin (VESICARE) 5 MG tablet Take 10 mg by mouth daily.       No current facility-administered medications for this visit.    SURGICAL HISTORY:  Past Surgical History  Procedure Laterality Date  . Abdominal hysterectomy      REVIEW OF SYSTEMS:  Pertinent items are noted in HPI.   PHYSICAL EXAMINATION:  HEENT exam EOMI PERRLA sclerae anicteric no conjunctival pallor oral mucosa is moist neck is supple lungs: Clear bilaterally to auscultation and percussion. Cardiovascular: Regular rate rhythm. Abdomen: Soft nontender nondistended bowel sounds are present no organomegaly. Extremities: No edema clubbing or cyanosis. Neuro: Patient is alert oriented x3 DTRs are +4 motor and sensory is intact strength is symmetrical in upper and lower extremities. ECOG PERFORMANCE STATUS: 1 - Symptomatic but completely ambulatory  Blood pressure 165/101, pulse 101, temperature 98.3 F (36.8 C), temperature source Oral, resp. rate 20, height 5\' 3"  (1.6 m), weight 188 lb 14.4 oz (85.684 kg).  LABORATORY DATA: Lab Results  Component Value Date   WBC 3.8* 07/01/2012   HGB 15.1 07/01/2012   HCT 44.3 07/01/2012   MCV 87.5 07/01/2012   PLT 172  07/01/2012      Chemistry      Component Value Date/Time   NA 138 03/24/2012 1526   NA 140 06/04/2009 1057   K 4.0 03/24/2012 1526   K 4.2 06/04/2009 1057   CL 104 03/24/2012 1526   CL 103 06/04/2009 1057   CO2 25 03/24/2012 1526   CO2 29 06/04/2009 1057   BUN 13 03/24/2012 1526   BUN 17 06/04/2009 1057   CREATININE 0.7 03/24/2012 1526   CREATININE 0.6 06/04/2009 1057      Component Value Date/Time   CALCIUM 9.8 03/24/2012 1526   CALCIUM 9.7 06/04/2009 1057   ALKPHOS 69 03/24/2012 1526   ALKPHOS 101* 06/04/2009 1057   AST 18 03/24/2012  1526   AST 36 06/04/2009 1057   ALT 20 03/24/2012 1526   BILITOT 0.9 03/24/2012 1526   BILITOT 0.50 06/04/2009 1057       RADIOGRAPHIC STUDIES:  No results found.  ASSESSMENT: 78 year old female with B12 deficiency and mild leukopenia. Her CBC was reviewed today her CBC looks stable. She tells me that she is unable to get her B12 injections at [facility]. We will make arrangements for it here.  PLAN:  #1 patient will continue B12 injections had [facility] every month.  #2 I will see her back in 6 months time for followup.  All questions were answered. The patient knows to call the clinic with any problems, questions or concerns. We can certainly see the patient much sooner if necessary.  I spent 20 minutes counseling the patient face to face. The total time spent in the appointment was 30 minutes.    Drue Second, MD Medical/Oncology [facility] [phone] (beeper) [phone] (Office)  07/01/2012, 11:49 AM

## 2012-07-01 NOTE — Telephone Encounter (Signed)
Message copied by Excell Seltzer on Fri Jul 01, 2012  3:59 PM ------      Message from: Debra [name]      Created: Thu Jun 23, 2012 10:30 AM      Regarding: Cpx labs 5/8 Thurs       Please order  future cpx labs for pt's upcoming lab appt.      Thanks      Tasha       ------

## 2012-07-01 NOTE — Patient Instructions (Signed)
Call MD for problems or concerns 

## 2012-07-01 NOTE — Patient Instructions (Addendum)
Proceed with B12 injections every month  I will see you back in 6 months

## 2012-07-07 ENCOUNTER — Other Ambulatory Visit (INDEPENDENT_AMBULATORY_CARE_PROVIDER_SITE_OTHER): Payer: Medicare Other

## 2012-07-07 DIAGNOSIS — E538 Deficiency of other specified B group vitamins: Secondary | ICD-10-CM

## 2012-07-07 DIAGNOSIS — E7800 Pure hypercholesterolemia, unspecified: Secondary | ICD-10-CM | POA: Diagnosis not present

## 2012-07-07 DIAGNOSIS — E559 Vitamin D deficiency, unspecified: Secondary | ICD-10-CM | POA: Diagnosis not present

## 2012-07-07 DIAGNOSIS — I10 Essential (primary) hypertension: Secondary | ICD-10-CM | POA: Diagnosis not present

## 2012-07-07 LAB — COMPREHENSIVE METABOLIC PANEL
Albumin: 3.9 g/dL (ref 3.5–5.2)
BUN: 13 mg/dL (ref 6–23)
Calcium: 9.1 mg/dL (ref 8.4–10.5)
Chloride: 108 mEq/L (ref 96–112)
Glucose, Bld: 94 mg/dL (ref 70–99)
Potassium: 5.1 mEq/L (ref 3.5–5.1)

## 2012-07-07 LAB — LIPID PANEL
Cholesterol: 189 mg/dL (ref 0–200)
Triglycerides: 141 mg/dL (ref 0.0–149.0)
VLDL: 28.2 mg/dL (ref 0.0–40.0)

## 2012-07-08 LAB — VITAMIN D 25 HYDROXY (VIT D DEFICIENCY, FRACTURES): Vit D, 25-Hydroxy: 16 ng/mL — ABNORMAL LOW (ref 30–89)

## 2012-07-12 ENCOUNTER — Encounter: Payer: Self-pay | Admitting: Family Medicine

## 2012-07-12 ENCOUNTER — Ambulatory Visit (INDEPENDENT_AMBULATORY_CARE_PROVIDER_SITE_OTHER): Payer: Medicare Other | Admitting: Family Medicine

## 2012-07-12 VITALS — BP 160/100 | HR 103 | Temp 99.0°F | Ht 63.0 in | Wt 190.2 lb

## 2012-07-12 DIAGNOSIS — Z0000 Encounter for general adult medical examination without abnormal findings: Secondary | ICD-10-CM | POA: Diagnosis not present

## 2012-07-12 DIAGNOSIS — I10 Essential (primary) hypertension: Secondary | ICD-10-CM | POA: Diagnosis not present

## 2012-07-12 DIAGNOSIS — E7800 Pure hypercholesterolemia, unspecified: Secondary | ICD-10-CM

## 2012-07-12 DIAGNOSIS — D72819 Decreased white blood cell count, unspecified: Secondary | ICD-10-CM | POA: Diagnosis not present

## 2012-07-12 DIAGNOSIS — E559 Vitamin D deficiency, unspecified: Secondary | ICD-10-CM | POA: Diagnosis not present

## 2012-07-12 MED ORDER — VITAMIN D (ERGOCALCIFEROL) 1.25 MG (50000 UNIT) PO CAPS: 50000.0000 [IU] | ORAL_CAPSULE | ORAL | Status: DC

## 2012-07-12 MED ORDER — LISINOPRIL 40 MG PO TABS: ORAL_TABLET | ORAL | Status: DC

## 2012-07-12 NOTE — Assessment & Plan Note (Signed)
Poor control. Increase lisinopril to 40 mg daily. Follow at home.

## 2012-07-12 NOTE — Progress Notes (Signed)
Subjective:    Patient ID: Debra [name], female    DOB: [birth year], 78 y.o.   MRN: [id number]  HPI I have personally reviewed the Medicare Annual Wellness questionnaire and have noted 1. The patient's medical and social history 2. Their use of alcohol, tobacco or illicit drugs 3. Their current medications and supplements 4. The patient's functional ability including ADL's, fall risks, home safety risks and hearing or visual             impairment. 5. Diet and physical activities 6. Evidence for depression or mood disorders The patients weight, height, BMI and visual acuity have been recorded in the chart I have made referrals, counseling and provided education to the patient based review of the above and I have provided the pt with a written personalized care plan for preventive services.  Recently with vertigo. MRI brian showed meningioma. Referred to neurosurgery, Dr. Dutch Quint.  Currently following with serial MRI, next in 07/2012.  She continues to have her head feel funny. No further vertigo. No headache.  No N/V.   Hypertension: Poor control on Lisinopril, verapamil. Was also elevated at Dr. Milta Deiters appointment. BP Readings from Last 3 Encounters:  07/12/12 160/100  07/01/12 165/101  03/24/12 180/90   Using medication without problems or lightheadedness:  No Chest pain with exertion: None  Edema:None  Short of breath:None  Average home BPs: Not checking at home lately. Was running higher when eating A lot of pork.. Better now.  Other issues:   Elevated Cholesterol: LDL at goal <130 on pravachol 40 mg daily (1/2 tab pf 80 mg) Lab Results  Component Value Date   CHOL 189 07/07/2012   HDL 41.10 07/07/2012   LDLCALC 120* 07/07/2012   LDLDIRECT 160.7 09/24/2009   TRIG 141.0 07/07/2012   CHOLHDL 5 07/07/2012     Using medications without problems:  Muscle aches: None  Diet compliance: Moderate  Exercise:walking daily, walks dog.  Other complaints:  Wt Readings from Last 3  Encounters:  07/12/12 190 lb 4 oz (86.297 kg)  07/01/12 188 lb 14.4 oz (85.684 kg)  06/17/11 191 lb 1.6 oz (86.682 kg)    Vit B12 def: Dr. Welton Flakes for monthly for B12.   Followed by Dr. Welton Flakes.. Stable mild leukopenia and thrombocytopenia .Marland Kitchen Seen 06/2012.  Vit D deficiency: 16.. Needs supplementation.     Review of Systems  Constitutional: Negative for fever and fatigue.  HENT: Negative for ear pain.   Eyes: Negative for pain.  Respiratory: Negative for chest tightness and shortness of breath.   Cardiovascular: Negative for chest pain, palpitations and leg swelling.  Gastrointestinal: Negative for abdominal pain.  Genitourinary: Negative for dysuria.       Objective:   Physical Exam  Constitutional: Vital signs are normal. She appears well-developed and well-nourished. She is cooperative.  Non-toxic appearance. She does not appear ill. No distress.  HENT:  Head: Normocephalic.  Right Ear: Hearing and tympanic membrane normal. There is drainage and tenderness.  Left Ear: Hearing, tympanic membrane, external ear and ear canal normal.  Nose: Nose normal.  Ulceration from hearing aid in ear canal.  Eyes: Conjunctivae, EOM and lids are normal. Pupils are equal, round, and reactive to light. No foreign bodies found.  Neck: Trachea normal and normal range of motion. Neck supple. Carotid bruit is not present. No mass and no thyromegaly present.  Cardiovascular: Normal rate, regular rhythm, S1 normal, S2 normal, normal heart sounds and intact distal pulses.  Exam reveals no gallop.   No  murmur heard. Pulmonary/Chest: Effort normal and breath sounds normal. No respiratory distress. She has no wheezes. She has no rhonchi. She has no rales.  Abdominal: Soft. Normal appearance and bowel sounds are normal. She exhibits no distension, no fluid wave, no abdominal bruit and no mass. There is no hepatosplenomegaly. There is no tenderness. There is no rebound, no guarding and no CVA tenderness. No  hernia.  Lymphadenopathy:    She has no cervical adenopathy.    She has no axillary adenopathy.  Neurological: She is alert. She has normal strength. No cranial nerve deficit or sensory deficit.  Skin: Skin is warm, dry and intact. No rash noted.  Psychiatric: Her speech is normal and behavior is normal. Judgment normal. Her mood appears not anxious. Cognition and memory are normal. She does not exhibit a depressed mood.          Assessment & Plan:  The patient's preventative maintenance and recommended screening tests for an annual wellness exam were reviewed in full today. Brought up to date unless services declined.  Counselled on the importance of diet, exercise, and its role in overall health and mortality. The patient's FH and SH was reviewed, including their home life, tobacco status, and drug and alcohol status.   Vaccines: uptodate  Colon:03/02/2004, nml,  Dr. Marina Goodell, repeat in 5-10 years DVE/pap: not indicated  Mammo: Due now. DEXA: 10/10/2008 osteopenia.. Will plan on bone density in 2015.

## 2012-07-12 NOTE — Assessment & Plan Note (Signed)
Vit D prescription supplementation then OTC maintenance.

## 2012-07-12 NOTE — Assessment & Plan Note (Signed)
Well controlled. Continue current medication.  

## 2012-07-12 NOTE — Patient Instructions (Addendum)
Increase lisnopril to 40 mg daily. Follow BP at home.. Call if remaining >140/90 in next week. Return for a kidney check 7-10 days after increasing the BP medication. Follow up HTN check in 1 month.  Take vit D 50000 Untis once WEEKLY x 12 weeks then change to OTC vit D3 400 IU twice daily. Cortisone10 for ear rash.  See hearing aid specialist to have aid refitted. Call to schedule Colonoscopy with  Dr. Marina Goodell. Call to schedule mammogram on your own.

## 2012-07-19 ENCOUNTER — Other Ambulatory Visit (INDEPENDENT_AMBULATORY_CARE_PROVIDER_SITE_OTHER): Payer: Medicare Other

## 2012-07-19 DIAGNOSIS — I10 Essential (primary) hypertension: Secondary | ICD-10-CM | POA: Diagnosis not present

## 2012-07-19 LAB — BASIC METABOLIC PANEL
GFR: 69.12 mL/min (ref >60.00)
Potassium: 4.1 mEq/L (ref 3.5–5.1)
Sodium: 139 mEq/L (ref 135–145)

## 2012-08-05 ENCOUNTER — Ambulatory Visit (HOSPITAL_BASED_OUTPATIENT_CLINIC_OR_DEPARTMENT_OTHER): Payer: Medicare Other

## 2012-08-05 VITALS — BP 159/88 | HR 92 | Temp 98.2°F

## 2012-08-05 DIAGNOSIS — E538 Deficiency of other specified B group vitamins: Secondary | ICD-10-CM

## 2012-08-05 MED ORDER — CYANOCOBALAMIN 1000 MCG/ML IJ SOLN
1000.0000 ug | Freq: Once | INTRAMUSCULAR | Status: AC
  Administered 2012-08-05: 1000 ug via INTRAMUSCULAR

## 2012-08-16 ENCOUNTER — Ambulatory Visit
Admission: RE | Admit: 2012-08-16 | Discharge: 2012-08-16 | Disposition: A | Discharge status: Home / Self Care | Payer: Medicare Other | Source: Ambulatory Visit | Attending: Neurosurgery | Admitting: Neurosurgery

## 2012-08-16 ENCOUNTER — Other Ambulatory Visit: Payer: Self-pay | Admitting: Family Medicine

## 2012-08-16 DIAGNOSIS — D496 Neoplasm of unspecified behavior of brain: Secondary | ICD-10-CM

## 2012-08-16 DIAGNOSIS — D320 Benign neoplasm of cerebral meninges: Secondary | ICD-10-CM | POA: Diagnosis not present

## 2012-08-16 MED ORDER — GADOBENATE DIMEGLUMINE 529 MG/ML IV SOLN
17.0000 mL | Freq: Once | INTRAVENOUS | Status: AC | PRN
  Administered 2012-08-16: 17 mL via INTRAVENOUS

## 2012-08-19 ENCOUNTER — Encounter: Payer: Self-pay | Admitting: Family Medicine

## 2012-08-19 ENCOUNTER — Ambulatory Visit (INDEPENDENT_AMBULATORY_CARE_PROVIDER_SITE_OTHER): Payer: Medicare Other | Admitting: Family Medicine

## 2012-08-19 VITALS — BP 140/88 | HR 95 | Temp 98.4°F | Wt 190.8 lb

## 2012-08-19 DIAGNOSIS — E559 Vitamin D deficiency, unspecified: Secondary | ICD-10-CM | POA: Diagnosis not present

## 2012-08-19 DIAGNOSIS — D329 Benign neoplasm of meninges, unspecified: Secondary | ICD-10-CM

## 2012-08-19 DIAGNOSIS — D320 Benign neoplasm of cerebral meninges: Secondary | ICD-10-CM | POA: Diagnosis not present

## 2012-08-19 DIAGNOSIS — I10 Essential (primary) hypertension: Secondary | ICD-10-CM

## 2012-08-19 MED ORDER — HYDROCHLOROTHIAZIDE 25 MG PO TABS: 25.0000 mg | ORAL_TABLET | Freq: Every day | ORAL | Status: DC

## 2012-08-19 NOTE — Progress Notes (Signed)
  Subjective:    Patient ID: Debra [name], female    DOB: Jun 02, 1934, 78 y.o.   MRN: [id number]  HPI  78 year old female with recent diagnosis of meningioma on MRI presents for 1 month follow up on HTN. Last OV... BPs were running 160/100... Her lisinopril was increased to 40 mg daily and she continued verapamil.  Today she reports BP is up and down at home. 113/74 to 157/92  HR 79-96  Wt Readings from Last 3 Encounters:  08/19/12 190 lb 12 oz (86.524 kg)  07/12/12 190 lb 4 oz (86.297 kg)  07/01/12 188 lb 14.4 oz (85.684 kg)   Has appt with Dr. Dutch Quint on 6/25 for the brain lesion. She wonders if   She is still with dizziness, headaches ( mild to severe), not resting well at night.  Review of Systems  Constitutional: Negative for fever and fatigue.  HENT: Negative for ear pain.   Eyes: Negative for pain.  Respiratory: Negative for chest tightness and shortness of breath.   Cardiovascular: Negative for chest pain, palpitations and leg swelling.  Gastrointestinal: Negative for abdominal pain.  Genitourinary: Negative for dysuria.       Objective:   Physical Exam  Constitutional: Vital signs are normal. She appears well-developed and well-nourished. She is cooperative.  Non-toxic appearance. She does not appear ill. No distress.  HENT:  Head: Normocephalic.  Right Ear: Hearing and tympanic membrane normal. There is drainage and tenderness.  Left Ear: Hearing, tympanic membrane, external ear and ear canal normal.  Nose: Nose normal.  Eyes: Conjunctivae, EOM and lids are normal. Pupils are equal, round, and reactive to light. No foreign bodies found.  Neck: Trachea normal and normal range of motion. Neck supple. Carotid bruit is not present. No mass and no thyromegaly present.  Cardiovascular: Normal rate, regular rhythm, S1 normal, S2 normal, normal heart sounds and intact distal pulses.  Exam reveals no gallop.   No murmur heard. Pulmonary/Chest: Effort normal and breath sounds  normal. No respiratory distress. She has no wheezes. She has no rhonchi. She has no rales.  Abdominal: Soft. Normal appearance and bowel sounds are normal. She exhibits no distension, no fluid wave, no abdominal bruit and no mass. There is no hepatosplenomegaly. There is no tenderness. There is no rebound, no guarding and no CVA tenderness. No hernia.  Lymphadenopathy:    She has no cervical adenopathy.    She has no axillary adenopathy.  Neurological: She is alert. She has normal strength. No cranial nerve deficit or sensory deficit.  Skin: Skin is warm, dry and intact. No rash noted.  Psychiatric: Her speech is normal and behavior is normal. Judgment normal. Her mood appears not anxious. Cognition and memory are normal. She does not exhibit a depressed mood.          Assessment & Plan:

## 2012-08-19 NOTE — Assessment & Plan Note (Signed)
When vit D prescription completed change over to OTC vitD 3

## 2012-08-19 NOTE — Assessment & Plan Note (Signed)
HAs follow up next week. headahce pain possibly contributing to HTN and vice-versa.

## 2012-08-19 NOTE — Assessment & Plan Note (Addendum)
She is on max verapamil and lisinopril.  Bp remains high.. Add HCTZ to regimen. This may have caused low K or Na in past per pt so at next OV in 1 month we will check electrolytes... May need maxide instead.

## 2012-08-19 NOTE — Patient Instructions (Addendum)
Call or send me a mychart note.Marland Kitchen Next week after appt with Dr. Dutch Quint to let me know his plan. Start HCTZ for blood pressure. Continue cadiazem and losartan. Follow up in 1 month. After done prescription vit D.. change to OTC vit D3 400 IU daily.

## 2012-08-24 DIAGNOSIS — D320 Benign neoplasm of cerebral meninges: Secondary | ICD-10-CM | POA: Diagnosis not present

## 2012-09-01 ENCOUNTER — Ambulatory Visit (HOSPITAL_BASED_OUTPATIENT_CLINIC_OR_DEPARTMENT_OTHER): Payer: Medicare Other

## 2012-09-01 VITALS — BP 163/85 | HR 88 | Temp 98.6°F

## 2012-09-01 DIAGNOSIS — E538 Deficiency of other specified B group vitamins: Secondary | ICD-10-CM

## 2012-09-01 MED ORDER — CYANOCOBALAMIN 1000 MCG/ML IJ SOLN
1000.0000 ug | Freq: Once | INTRAMUSCULAR | Status: AC
  Administered 2012-09-01: 1000 ug via INTRAMUSCULAR

## 2012-09-14 DIAGNOSIS — H2510 Age-related nuclear cataract, unspecified eye: Secondary | ICD-10-CM | POA: Diagnosis not present

## 2012-09-22 ENCOUNTER — Encounter: Payer: Self-pay | Admitting: Family Medicine

## 2012-09-22 ENCOUNTER — Ambulatory Visit (INDEPENDENT_AMBULATORY_CARE_PROVIDER_SITE_OTHER): Payer: Medicare Other | Admitting: Family Medicine

## 2012-09-22 VITALS — BP 110/78 | HR 96 | Temp 98.1°F | Ht 63.0 in | Wt 191.5 lb

## 2012-09-22 DIAGNOSIS — I10 Essential (primary) hypertension: Secondary | ICD-10-CM

## 2012-09-22 DIAGNOSIS — D329 Benign neoplasm of meninges, unspecified: Secondary | ICD-10-CM

## 2012-09-22 DIAGNOSIS — D320 Benign neoplasm of cerebral meninges: Secondary | ICD-10-CM

## 2012-09-22 LAB — BASIC METABOLIC PANEL
Calcium: 9.6 mg/dL (ref 8.4–10.5)
GFR: 52.4 mL/min — ABNORMAL LOW (ref >60.00)
Glucose, Bld: 99 mg/dL (ref 70–99)
Sodium: 139 mEq/L (ref 135–145)

## 2012-09-22 NOTE — Assessment & Plan Note (Signed)
Plan MRI routinely to follow. No treatment at this time.

## 2012-09-22 NOTE — Assessment & Plan Note (Signed)
Improved control with addition of HCTZ. Will  evaluate electrolytes Encouraged exercise, weight loss, healthy eating habits.

## 2012-09-22 NOTE — Patient Instructions (Signed)
Continue to follow BP measurements... Call if BP is <100/60 regularly. Stop at lab on your way out. We will call  with lab results.  Follow up in 6 months for HTN check.

## 2012-09-22 NOTE — Progress Notes (Signed)
78 year old female with recent diagnosis of meningioma on MRI presents for 1 month follow up on HTN.   OV on 5/13... BPs were running 160/100... Her lisinopril was increased to 40 mg daily and she continued verapamil.  At OV on 6/20 she reported that BP was up and down at home. 113/74 to 157/92 HR 79-96  HCTZ was added to her max verapamil and lisinopril. She does have history of electrolyte change with HCTZ, so this needs to be evaluated today. She did have some low levels 93/73-- has lately been 115/77 to 137/73. She had some initial weakness , but resolved now. She still is having some dizziness. No longer with N/V/vertigo.  Dr. Dutch Quint plans on following meningioma. Recommended no surgery, no radiation... Just following with MRI.  Has upcoming cataract surgery on B eyes, stepwise.  Review of Systems  Constitutional: Negative for fever and fatigue.  HENT: Negative for ear pain.  Eyes: Negative for pain.  Respiratory: Negative for chest tightness and shortness of breath.  Cardiovascular: Negative for chest pain, palpitations and leg swelling.  Gastrointestinal: Negative for abdominal pain.  Genitourinary: Negative for dysuria.  Objective:   Physical Exam  Constitutional: Vital signs are normal. She appears well-developed and well-nourished. She is cooperative. Non-toxic appearance. She does not appear ill. No distress.  HENT:  Head: Normocephalic.  Right Ear: Hearing and tympanic membrane normal.  Left Ear: Hearing, tympanic membrane, external ear and ear canal normal.  Nose: Nose normal.  Eyes: Conjunctivae, EOM and lids are normal. Pupils are equal, round, and reactive to light. No foreign bodies found.  Neck: Trachea normal and normal range of motion. Neck supple. Carotid bruit is not present. No mass and no thyromegaly present.  Cardiovascular: Normal rate, regular rhythm, S1 normal, S2 normal, normal heart sounds and intact distal pulses. Exam reveals no gallop.  No murmur  heard.  Pulmonary/Chest: Effort normal and breath sounds normal. No respiratory distress. She has no wheezes. She has no rhonchi. She has no rales.  Abdominal: Soft. Normal appearance and bowel sounds are normal. She exhibits no distension, no fluid wave, no abdominal bruit and no mass. There is no hepatosplenomegaly. There is no tenderness. There is no rebound, no guarding and no CVA tenderness. No hernia.  Lymphadenopathy:  She has no cervical adenopathy.  She has no axillary adenopathy.  Neurological: She is alert. She has normal strength. No cranial nerve deficit or sensory deficit.  Skin: Skin is warm, dry and intact. No rash noted.  Psychiatric: Her speech is normal and behavior is normal. Judgment normal. Her mood appears not anxious. Cognition and memory are normal. She does not exhibit a depressed mood.

## 2012-09-27 ENCOUNTER — Other Ambulatory Visit: Payer: Self-pay | Admitting: Family Medicine

## 2012-09-30 ENCOUNTER — Ambulatory Visit (HOSPITAL_BASED_OUTPATIENT_CLINIC_OR_DEPARTMENT_OTHER): Payer: Medicare Other

## 2012-09-30 VITALS — BP 142/70 | HR 77 | Temp 98.0°F | Resp 20

## 2012-09-30 DIAGNOSIS — E538 Deficiency of other specified B group vitamins: Secondary | ICD-10-CM | POA: Diagnosis not present

## 2012-09-30 MED ORDER — CYANOCOBALAMIN 1000 MCG/ML IJ SOLN
1000.0000 ug | Freq: Once | INTRAMUSCULAR | Status: AC
Start: 2012-09-30 — End: 2012-09-30
  Administered 2012-09-30: 1000 ug via INTRAMUSCULAR

## 2012-09-30 NOTE — Patient Instructions (Addendum)

## 2012-10-21 DIAGNOSIS — H4010X0 Unspecified open-angle glaucoma, stage unspecified: Secondary | ICD-10-CM | POA: Diagnosis not present

## 2012-10-21 DIAGNOSIS — H18419 Arcus senilis, unspecified eye: Secondary | ICD-10-CM | POA: Diagnosis not present

## 2012-10-21 DIAGNOSIS — H409 Unspecified glaucoma: Secondary | ICD-10-CM | POA: Diagnosis not present

## 2012-10-21 DIAGNOSIS — H2510 Age-related nuclear cataract, unspecified eye: Secondary | ICD-10-CM | POA: Diagnosis not present

## 2012-11-04 ENCOUNTER — Ambulatory Visit (HOSPITAL_BASED_OUTPATIENT_CLINIC_OR_DEPARTMENT_OTHER): Payer: Medicare Other

## 2012-11-04 VITALS — BP 160/83 | HR 97 | Temp 98.1°F

## 2012-11-04 DIAGNOSIS — E538 Deficiency of other specified B group vitamins: Secondary | ICD-10-CM | POA: Diagnosis not present

## 2012-11-04 MED ORDER — CYANOCOBALAMIN 1000 MCG/ML IJ SOLN
1000.0000 ug | Freq: Once | INTRAMUSCULAR | Status: AC
  Administered 2012-11-04: 1000 ug via INTRAMUSCULAR

## 2012-11-10 ENCOUNTER — Telehealth: Payer: Self-pay | Admitting: Oncology

## 2012-12-02 ENCOUNTER — Ambulatory Visit (HOSPITAL_BASED_OUTPATIENT_CLINIC_OR_DEPARTMENT_OTHER): Payer: Medicare Other

## 2012-12-02 VITALS — BP 152/77 | HR 105 | Temp 98.5°F

## 2012-12-02 DIAGNOSIS — E538 Deficiency of other specified B group vitamins: Secondary | ICD-10-CM

## 2012-12-02 MED ORDER — CYANOCOBALAMIN 1000 MCG/ML IJ SOLN
1000.0000 ug | Freq: Once | INTRAMUSCULAR | Status: AC
Start: 2012-12-02 — End: 2012-12-02
  Administered 2012-12-02: 1000 ug via INTRAMUSCULAR

## 2012-12-05 DIAGNOSIS — H2510 Age-related nuclear cataract, unspecified eye: Secondary | ICD-10-CM | POA: Diagnosis not present

## 2012-12-05 DIAGNOSIS — H269 Unspecified cataract: Secondary | ICD-10-CM | POA: Diagnosis not present

## 2012-12-06 DIAGNOSIS — H2510 Age-related nuclear cataract, unspecified eye: Secondary | ICD-10-CM | POA: Diagnosis not present

## 2012-12-08 DIAGNOSIS — S058X9A Other injuries of unspecified eye and orbit, initial encounter: Secondary | ICD-10-CM | POA: Diagnosis not present

## 2012-12-21 DIAGNOSIS — Z23 Encounter for immunization: Secondary | ICD-10-CM | POA: Diagnosis not present

## 2012-12-26 DIAGNOSIS — H2510 Age-related nuclear cataract, unspecified eye: Secondary | ICD-10-CM | POA: Diagnosis not present

## 2012-12-26 DIAGNOSIS — H269 Unspecified cataract: Secondary | ICD-10-CM | POA: Diagnosis not present

## 2013-01-05 ENCOUNTER — Other Ambulatory Visit: Payer: Self-pay | Admitting: Emergency Medicine

## 2013-01-05 ENCOUNTER — Other Ambulatory Visit (HOSPITAL_BASED_OUTPATIENT_CLINIC_OR_DEPARTMENT_OTHER): Payer: Medicare Other | Admitting: Lab

## 2013-01-05 ENCOUNTER — Ambulatory Visit: Payer: Medicare Other | Admitting: Oncology

## 2013-01-05 ENCOUNTER — Other Ambulatory Visit: Payer: Medicare Other | Admitting: Lab

## 2013-01-05 ENCOUNTER — Ambulatory Visit: Payer: Medicare Other

## 2013-01-05 ENCOUNTER — Telehealth: Payer: Self-pay | Admitting: Oncology

## 2013-01-05 ENCOUNTER — Encounter: Payer: Self-pay | Admitting: Oncology

## 2013-01-05 ENCOUNTER — Ambulatory Visit (HOSPITAL_BASED_OUTPATIENT_CLINIC_OR_DEPARTMENT_OTHER): Payer: Medicare Other | Admitting: Oncology

## 2013-01-05 ENCOUNTER — Ambulatory Visit (HOSPITAL_BASED_OUTPATIENT_CLINIC_OR_DEPARTMENT_OTHER): Payer: Medicare Other

## 2013-01-05 VITALS — BP 143/91 | HR 84 | Temp 98.4°F | Resp 18

## 2013-01-05 VITALS — BP 162/87 | HR 88 | Temp 98.7°F | Resp 18 | Ht 63.0 in | Wt 189.9 lb

## 2013-01-05 DIAGNOSIS — D519 Vitamin B12 deficiency anemia, unspecified: Secondary | ICD-10-CM

## 2013-01-05 DIAGNOSIS — D72819 Decreased white blood cell count, unspecified: Secondary | ICD-10-CM

## 2013-01-05 DIAGNOSIS — E538 Deficiency of other specified B group vitamins: Secondary | ICD-10-CM

## 2013-01-05 DIAGNOSIS — E559 Vitamin D deficiency, unspecified: Secondary | ICD-10-CM

## 2013-01-05 LAB — CBC WITH DIFFERENTIAL/PLATELET
Basophils Absolute: 0.1 10*3/uL (ref 0.0–0.1)
Eosinophils Absolute: 0.1 10*3/uL (ref 0.0–0.5)
HGB: 13.7 g/dL (ref 11.6–15.9)
MCV: 87.9 fL (ref 79.5–101.0)
MONO#: 0.3 10*3/uL (ref 0.1–0.9)
MONO%: 7.2 % (ref 0.0–14.0)
NEUT#: 2.5 10*3/uL (ref 1.5–6.5)
Platelets: 179 10*3/uL (ref 145–400)
RDW: 13.5 % (ref 11.2–14.5)
WBC: 4.1 10*3/uL (ref 3.9–10.3)

## 2013-01-05 MED ORDER — CYANOCOBALAMIN 1000 MCG/ML IJ SOLN
1000.0000 ug | Freq: Once | INTRAMUSCULAR | Status: AC
Start: 2013-01-05 — End: 2013-01-05
  Administered 2013-01-05: 1000 ug via INTRAMUSCULAR

## 2013-01-05 NOTE — Progress Notes (Signed)
OFFICE PROGRESS NOTE  CC  Debra Nora, MD [address] [place] [address]  DIAGNOSIS: 78 year old female with B12 deficiency and mild leukopenia  PRIOR THERAPY:  #1 leukopenia: Observation.  #2 B12 deficiency: Patient has been receiving monthly B12 injections at the [facility]. She no longer can receive those day or so she will now start coming to the cancer Center for injections on a monthly basis.  CURRENT THERAPY:Vitamin B12 1000 mcg q. Monthly  INTERVAL HISTORY: Debra [name] 78 y.o. female returns for Followup visit today.She has not had any fevers chills night sweats headaches she does have some shortness of breath and fatigue. Patient recently had cataract extraction performed.. Patient is continuing to wear hearing aids. She has no nausea or vomiting no bleeding problems remainder of the 10 point review of systems is negative.  MEDICAL HISTORY: Past Medical History  Diagnosis Date  . Pure hypercholesterolemia   . Degeneration of lumbar or lumbosacral intervertebral disc   . Lumbago   . Female stress incontinence   . Paroxysmal supraventricular tachycardia   . Unspecified glaucoma(365.9)   . Diverticulosis of colon with hemorrhage     ALLERGIES:  is allergic to codeine phosphate and latex.  MEDICATIONS:  Current Outpatient Prescriptions  Medication Sig Dispense Refill  . brimonidine (ALPHAGAN) 0.2 % ophthalmic solution Place 1 drop into both eyes 2 (two) times daily.      . Cholecalciferol (VITAMIN D PO) Take 400 Int'l Units by mouth daily.      . hydrochlorothiazide (HYDRODIURIL) 25 MG tablet Take 1 tablet (25 mg total) by mouth daily.  30 tablet  11  . latanoprost (XALATAN) 0.005 % ophthalmic solution       . lisinopril (PRINIVIL,ZESTRIL) 40 MG tablet TAKE 1 TABLET BY MOUTH DAILY  90 tablet  3  . pravastatin (PRAVACHOL) 80 MG tablet TAKE 1/2 TABLET BY MOUTH ONCE A DAY  90 tablet  1  . solifenacin (VESICARE)  5 MG tablet Take 10 mg by mouth daily.      . timolol (TIMOPTIC) 0.5 % ophthalmic solution       . verapamil (CALAN-SR) 240 MG CR tablet TAKE 1 TABLET BY MOUTH AT BEDTIME  90 tablet  3   No current facility-administered medications for this visit.    SURGICAL HISTORY:  Past Surgical History  Procedure Laterality Date  . Abdominal hysterectomy      REVIEW OF SYSTEMS:  Pertinent items are noted in HPI.   PHYSICAL EXAMINATION:  HEENT exam EOMI PERRLA sclerae anicteric no conjunctival pallor oral mucosa is moist neck is supple lungs: Clear bilaterally to auscultation and percussion. Cardiovascular: Regular rate rhythm. Abdomen: Soft nontender nondistended bowel sounds are present no organomegaly. Extremities: No edema clubbing or cyanosis. Neuro: Patient is alert oriented x3 DTRs are +4 motor and sensory is intact strength is symmetrical in upper and lower extremities. ECOG PERFORMANCE STATUS: 1 - Symptomatic but completely ambulatory  Blood pressure 162/87, pulse 88, temperature 98.7 F (37.1 C), temperature source Oral, resp. rate 18, height 5\' 3"  (1.6 m), weight 189 lb 14.4 oz (86.138 kg).  LABORATORY DATA: Lab Results  Component Value Date   WBC 4.1 01/05/2013   HGB 13.7 01/05/2013   HCT 40.7 01/05/2013   MCV 87.9 01/05/2013   PLT 179 01/05/2013      Chemistry      Component Value Date/Time   NA 139 09/22/2012 1454   NA 140 06/04/2009 1057  K 4.0 09/22/2012 1454   K 4.2 06/04/2009 1057   CL 103 09/22/2012 1454   CL 103 06/04/2009 1057   CO2 29 09/22/2012 1454   CO2 29 06/04/2009 1057   BUN 21 09/22/2012 1454   BUN 17 06/04/2009 1057   CREATININE 1.1 09/22/2012 1454   CREATININE 0.6 06/04/2009 1057      Component Value Date/Time   CALCIUM 9.6 09/22/2012 1454   CALCIUM 9.7 06/04/2009 1057   ALKPHOS 60 07/07/2012 1027   ALKPHOS 101* 06/04/2009 1057   AST 20 07/07/2012 1027   AST 36 06/04/2009 1057   ALT 13 07/07/2012 1027   ALT 54* 06/04/2009 1057   BILITOT 0.7 07/07/2012 1027   BILITOT 0.50  06/04/2009 1057       RADIOGRAPHIC STUDIES:  No results found.  ASSESSMENT: 78 year old female with B12 deficiency and mild leukopenia. Her CBC was reviewed today her CBC looks stable. She tells me that she is unable to get her B12 injections at [facility]. We will make arrangements for it here.  PLAN:  #1 patient will continue B12 injections at the cancer Center every month.  #2 I will see her back in 6 months time for followup.  All questions were answered. The patient knows to call the clinic with any problems, questions or concerns. We can certainly see the patient much sooner if necessary.  I spent 10 minutes counseling the patient face to face. The total time spent in the appointment was 15 minutes.    Drue Second, MD Medical/Oncology [facility] [phone] (beeper) [phone] (Office)  01/05/2013, 4:30 PM

## 2013-01-16 DIAGNOSIS — H4011X0 Primary open-angle glaucoma, stage unspecified: Secondary | ICD-10-CM | POA: Diagnosis not present

## 2013-01-16 DIAGNOSIS — H409 Unspecified glaucoma: Secondary | ICD-10-CM | POA: Diagnosis not present

## 2013-02-02 ENCOUNTER — Ambulatory Visit (HOSPITAL_BASED_OUTPATIENT_CLINIC_OR_DEPARTMENT_OTHER): Payer: Medicare Other

## 2013-02-02 VITALS — BP 141/76 | HR 92 | Temp 98.6°F

## 2013-02-02 DIAGNOSIS — E538 Deficiency of other specified B group vitamins: Secondary | ICD-10-CM | POA: Diagnosis not present

## 2013-02-02 MED ORDER — CYANOCOBALAMIN 1000 MCG/ML IJ SOLN
1000.0000 ug | Freq: Once | INTRAMUSCULAR | Status: AC
  Administered 2013-02-02: 1000 ug via INTRAMUSCULAR

## 2013-03-03 ENCOUNTER — Ambulatory Visit (HOSPITAL_BASED_OUTPATIENT_CLINIC_OR_DEPARTMENT_OTHER): Payer: Medicare Other

## 2013-03-03 VITALS — BP 169/71 | HR 76 | Temp 98.2°F

## 2013-03-03 DIAGNOSIS — E538 Deficiency of other specified B group vitamins: Secondary | ICD-10-CM

## 2013-03-03 MED ORDER — CYANOCOBALAMIN 1000 MCG/ML IJ SOLN
1000.0000 ug | Freq: Once | INTRAMUSCULAR | Status: AC
  Administered 2013-03-03: 1000 ug via INTRAMUSCULAR

## 2013-03-28 ENCOUNTER — Ambulatory Visit (INDEPENDENT_AMBULATORY_CARE_PROVIDER_SITE_OTHER): Payer: Medicare Other | Admitting: Family Medicine

## 2013-03-28 VITALS — BP 150/98 | HR 98 | Temp 98.4°F | Ht 63.0 in | Wt 189.2 lb

## 2013-03-28 DIAGNOSIS — I10 Essential (primary) hypertension: Secondary | ICD-10-CM

## 2013-03-28 DIAGNOSIS — H01139 Eczematous dermatitis of unspecified eye, unspecified eyelid: Secondary | ICD-10-CM | POA: Diagnosis not present

## 2013-03-28 DIAGNOSIS — E7800 Pure hypercholesterolemia, unspecified: Secondary | ICD-10-CM | POA: Diagnosis not present

## 2013-03-28 MED ORDER — TRIAMCINOLONE ACETONIDE 0.1 % EX CREA: 1.0000 "application " | TOPICAL_CREAM | Freq: Two times a day (BID) | CUTANEOUS | Status: DC

## 2013-03-28 NOTE — Assessment & Plan Note (Signed)
Oral antihistamine and topical steroid.

## 2013-03-28 NOTE — Patient Instructions (Addendum)
Return in 6 month for medicare wellness with labs prior.  Apply steroid cream to eyelid and area at corner twice daily.  Can add antihistamine like claritin for possible allergies. Call/ MyChart BP measurement after 1-2 weeks of checking it daily.

## 2013-03-28 NOTE — Progress Notes (Signed)
Pre-visit discussion using our clinic review tool. No additional management support is needed unless otherwise documented below in the visit note.  

## 2013-03-28 NOTE — Progress Notes (Signed)
Subjective:    Patient ID: Debra [name], female    DOB: 07-Mar-1934, 79 y.o.   MRN: [id number]  HPI 79 year old female presents for 6 month follow up.  She has been having itching, burning around B eyes x 2 weeks. Primarily eye lids.  A little watery eyes in sun, but usual for her.  No eye discharge. No foreign body sensation.  Has glaucoma, on eye meds. No new lotion exposures.  Hypertension:  Poor control on lisinopril, verapamil, HCTZ. HR 98 today. BP Readings from Last 3 Encounters:  03/28/13 150/98  03/03/13 169/71  02/02/13 141/76  Using medication without problems or lightheadedness: No  Chest pain with exertion: None  Edema:None  Short of breath:None  Average home BPs: She has noted BPs at home 140/70 Other issues:   Elevated Cholesterol: LDL at goal last check <130 on pravachol 40 mg daily (1/2 tab pf 80 mg)  Lab Results  Component Value Date   CHOL 189 07/07/2012   HDL 41.10 07/07/2012   LDLCALC 120* 07/07/2012   LDLDIRECT 160.7 09/24/2009   TRIG 141.0 07/07/2012   CHOLHDL 5 07/07/2012  Using medications without problems:  Muscle aches: None  Diet compliance: Moderate  Exercise:walking daily, walks dog.  Other complaints:  Wt Readings from Last 3 Encounters:  03/28/13 189 lb 4 oz (85.843 kg)  01/05/13 189 lb 14.4 oz (86.138 kg)  09/22/12 191 lb 8 oz (86.864 kg)    Vit B12 def: Dr. Humphrey Rolls for monthly for B12.   Followed by Dr. Humphrey Rolls.. Stable mild leukopenia and thrombocytopenia .Marland Kitchen Seen 06/2012.   Vit D deficiency: 9.. Needs supplementation.      Review of Systems  Constitutional: Negative for fever and fatigue.  HENT: Negative for ear pain.   Eyes: Negative for pain.  Respiratory: Negative for chest tightness and shortness of breath.   Cardiovascular: Negative for chest pain, palpitations and leg swelling.  Gastrointestinal: Negative for abdominal pain.  Genitourinary: Negative for dysuria.       Objective:   Physical Exam  Constitutional: Vital  signs are normal. She appears well-developed and well-nourished. She is cooperative.  Non-toxic appearance. She does not appear ill. No distress.  HENT:  Head: Normocephalic.  Right Ear: Hearing, tympanic membrane, external ear and ear canal normal. Tympanic membrane is not erythematous, not retracted and not bulging.  Left Ear: Hearing, tympanic membrane, external ear and ear canal normal. Tympanic membrane is not erythematous, not retracted and not bulging.  Nose: No mucosal edema or rhinorrhea. Right sinus exhibits no maxillary sinus tenderness and no frontal sinus tenderness. Left sinus exhibits no maxillary sinus tenderness and no frontal sinus tenderness.  Mouth/Throat: Uvula is midline, oropharynx is clear and moist and mucous membranes are normal.  Eyes: Conjunctivae and EOM are normal. Pupils are equal, round, and reactive to light. Lids are everted and swept, no foreign bodies found. Right eye exhibits no exudate. Left eye exhibits no exudate. Right conjunctiva is not injected. Left conjunctiva is not injected.  Erythema, dry skin around B eyes, cracks at corners of eyes laterally B.  Neck: Trachea normal and normal range of motion. Neck supple. Carotid bruit is not present. No mass and no thyromegaly present.  Cardiovascular: Normal rate, regular rhythm, S1 normal, S2 normal, normal heart sounds, intact distal pulses and normal pulses.  Exam reveals no gallop and no friction rub.   No murmur heard. Pulmonary/Chest: Effort normal and breath sounds normal. Not tachypneic. No respiratory distress. She has no decreased  breath sounds. She has no wheezes. She has no rhonchi. She has no rales.  Abdominal: Soft. Normal appearance and bowel sounds are normal. There is no tenderness.  Neurological: She is alert.  Skin: Skin is warm, dry and intact. No rash noted.  Psychiatric: Her speech is normal and behavior is normal. Judgment and thought content normal. Her mood appears not anxious. Cognition  and memory are normal. She does not exhibit a depressed mood.          Assessment & Plan:

## 2013-03-28 NOTE — Assessment & Plan Note (Signed)
Elevated at MD, better at home.. Follow over next 2 weeks daily, if not improving will need to add additional med as she is on max of three other BP meds. Encouraged exercise, weight loss, healthy eating habits.

## 2013-03-28 NOTE — Assessment & Plan Note (Signed)
Well controlled last check,  will re-eval in 6 months.

## 2013-03-30 ENCOUNTER — Ambulatory Visit (HOSPITAL_BASED_OUTPATIENT_CLINIC_OR_DEPARTMENT_OTHER): Payer: Medicare Other

## 2013-03-30 VITALS — BP 175/85 | HR 94 | Temp 98.4°F

## 2013-03-30 DIAGNOSIS — E538 Deficiency of other specified B group vitamins: Secondary | ICD-10-CM

## 2013-03-30 MED ORDER — CYANOCOBALAMIN 1000 MCG/ML IJ SOLN
1000.0000 ug | Freq: Once | INTRAMUSCULAR | Status: AC
  Administered 2013-03-30: 1000 ug via INTRAMUSCULAR

## 2013-03-30 NOTE — Patient Instructions (Signed)
Vitamin B12 and Folate Test This is a test used to help diagnose the cause of anemia or neuropathy (nerve damage), to evaluate nutritional status in some patients, or to monitor effectiveness of treatment for B12 or folate deficiency. These tests measure the concentration of folate and vitamin B12 in the serum (liquid portion of the blood). The amount of folate inside the red blood cell (RBC) may also be measured ; it will normally be at a higher concentration inside the cell than in the serum.  B12 and folate are both part of the B complex of vitamins and come from food. Folate is found in leafy green vegetables, citrus fruits, dry beans and peas, liver, and yeast; while B12 is found in animal products such as red meat, fish, poultry, milk, and eggs. Fortified cereals, breads, and other grain products are now also important dietary sources of both B12 and folate (identified as "folic acid" on nutritional labels), especially for those vegetarians who do not consume any animal products.  Both B12 and folate are necessary for normal RBC formation, tissue and cellular repair, and DNA synthesis. B12 is also important for nerve health, while folate is necessary for cell division such as is seen in a fetus during pregnancy. A deficiency in either B12 or folate can lead to a form of anemia characterized by the production of fewer, but larger, RBC's (called macrocytes). A deficiency in B12 can also result in varying degrees of neuropathy, nerve damage that can cause tingling and numbness in the patient's hands and feet. A deficiency in folate can cause neural tube defects such as spina bifida in a growing fetus.  PREPARATION FOR TEST No fasting is necessary. A blood sample is obtained by inserting a needle into a vein in the arm. NORMAL FINDINGS 160-950 pg/mL or 118-701 pmol/L (SI units) Ranges for normal findings may vary among different laboratories and hospitals. You should always check with your doctor after  having lab work or other tests done to discuss the meaning of your test results and whether your values are considered within normal limits. MEANING OF TEST  Your caregiver will go over the test results with you and discuss the importance and meaning of your results, as well as treatment options and the need for additional tests if necessary. Reference values are dependent on many factors, including patient age, gender, sample population, and testing method. Numeric test results have different meanings in different labs. Your lab report should include the specific reference range for your test.  If there is a decreased concentration of B12 and/or folate, then it is likely there is some degree of deficiency. The test results will indicate the presence of the deficiency, but they do not necessarily reflect the severity of the anemia or neuropathy associated with the deficiency or its underlying cause. There are a variety of causes of B12 and/or folate deficiencies. Most of these are from poor intake, not absorbing the vitamins or losing more vitamins than are taken in. OBTAINING THE TEST RESULTS It is your responsibility to obtain your test results. Ask the lab or department performing the test when and how you will get your results. Document Released: 03/13/2004 Document Revised: 05/11/2011 Document Reviewed: 02/19/2008 Exit[employer] Patient Information 2014 [employer].  

## 2013-04-05 ENCOUNTER — Telehealth: Payer: Self-pay | Admitting: Family Medicine

## 2013-04-05 NOTE — Telephone Encounter (Signed)
Relevant patient education mailed to patient.  

## 2013-04-27 ENCOUNTER — Ambulatory Visit: Payer: Medicare Other

## 2013-04-27 ENCOUNTER — Telehealth: Payer: Self-pay | Admitting: Oncology

## 2013-04-27 NOTE — Telephone Encounter (Signed)
, °

## 2013-05-01 ENCOUNTER — Encounter (INDEPENDENT_AMBULATORY_CARE_PROVIDER_SITE_OTHER): Payer: Self-pay

## 2013-05-01 ENCOUNTER — Ambulatory Visit (HOSPITAL_BASED_OUTPATIENT_CLINIC_OR_DEPARTMENT_OTHER): Payer: Medicare Other

## 2013-05-01 ENCOUNTER — Telehealth: Payer: Self-pay | Admitting: Oncology

## 2013-05-01 VITALS — BP 160/90 | HR 120 | Temp 98.2°F

## 2013-05-01 DIAGNOSIS — E538 Deficiency of other specified B group vitamins: Secondary | ICD-10-CM

## 2013-05-01 MED ORDER — CYANOCOBALAMIN 1000 MCG/ML IJ SOLN
1000.0000 ug | Freq: Once | INTRAMUSCULAR | Status: AC
  Administered 2013-05-01: 1000 ug via INTRAMUSCULAR

## 2013-05-01 NOTE — Telephone Encounter (Signed)
gave pt apt for injections every month and see Ml on July, all dates were moved due to weather, injections are now every month per MD

## 2013-05-01 NOTE — Patient Instructions (Signed)
Vitamin B12 Injections Every person needs vitamin B12. A deficiency develops when the body does not get enough of it. One way to overcome this is by getting B12 shots (injections). A B12 shot puts the vitamin directly into muscle tissue. This avoids any problems your body might have in absorbing it from food or a pill. In some people, the body has trouble using the vitamin correctly. This can cause a B12 deficiency. Not consuming enough of the vitamin can also cause a deficiency. Getting enough vitamin B12 can be hard for elderly people. Sometimes, they do not eat a well-balanced diet. The elderly are also more likely than younger people to have medical conditions or take medications that can lead to a deficiency. WHAT DOES VITAMIN B12 DO? Vitamin B12 does many things to help the body work right:  It helps the body make healthy red blood cells.  It helps maintain nerve cells.  It is involved in the body's process of converting food into energy (metabolism).  It is needed to make the genetic material in all cells (DNA). VITAMIN B12 FOOD SOURCES Most people get plenty of vitamin B12 through the foods they eat. It is present in:  Meat, fish, poultry, and eggs.  Milk and milk products.  It also is added when certain foods are made, including some breads, cereals and yogurts. The food is then called "fortified". CAUSES The most common causes of vitamin B12 deficiency are:  Pernicious anemia. The condition develops when the body cannot make enough healthy red blood cells. This stems from a lack of a protein made in the stomach (intrinsic factor). People without this protein cannot absorb enough vitamin B12 from food.  Malabsorption. This is when the body cannot absorb the vitamin. It can be caused by:  Pernicious anemia.  Surgery to remove part or all of the stomach can lead to malabsorption. Removal of part or all of the small intestine can also cause malabsorption.  Vegetarian diet.  People who are strict about not eating foods from animals could have trouble taking in enough vitamin B12 from diet alone.  Medications. Some medicines have been linked to B12 deficiency, such as Metformin (a drug prescribed for type 2 diabetes). Long-term use of stomach acid suppressants also can keep the vitamin from being absorbed.  Intestinal problems such as inflammatory bowel disease. If there are problems in the digestive tract, vitamin B12 may not be absorbed in good enough amounts. SYMPTOMS People who do not get enough B12 can develop problems. These can include:  Anemia. This is when the body has too few red blood cells. Red blood cells carry oxygen to the rest of the body. Without a healthy supply of red blood cells, people can feel:  Tired (fatigued).  Weak.  Severe anemia can cause:  Shortness of breath.  Dizziness.  Rapid heart rate.  Paleness.  Other Vitamin B12 deficiency symptoms include:  Diarrhea.  Numbness or tingling in the hands or feet.  Loss of appetite.  Confusion.  Sores on the tongue or in the mouth. LET YOUR CAREGIVER KNOW ABOUT:  Any allergies. It is very important to know if you are allergic or sensitive to cobalt. Vitamin B12 contains cobalt.  Any history of kidney disease.  All medications you are taking. Include prescription and over-the-counter medicines, herbs and creams.  Whether you are pregnant or breast-feeding.  If you have Leber's disease, a hereditary eye condition, vitamin B12 could make it worse. RISKS AND COMPLICATIONS Reactions to an injection are   usually temporary. They might include:  Pain at the injection site.  Redness, swelling or tenderness at the site.  Headache, dizziness or weakness.  Nausea, upset stomach or diarrhea.  Numbness or tingling.  Fever.  Joint pain.  Itching or rash. If a reaction does not go away in a short while, talk with your healthcare provider. A change in the way the shots are  given, or where they are given, might need to be made. BEFORE AN INJECTION To decide whether B12 injections are right for you, your healthcare provider will probably:  Ask about your medical history.  Ask questions about your diet.  Ask about symptoms such as:  Have you felt weak?  Do you feel unusually tired?  Do you get dizzy?  Order blood tests. These may include a test to:  Check the level of red cells in your blood.  Measure B12 levels.  Check for the presence of intrinsic factor. VITAMIN B12 INJECTIONS How often you will need a vitamin B12 injection will depend on how severe your deficiency is. This also will affect how long you will need to get them. People with pernicious anemia usually get injections for their entire life. Others might get them for a shorter period. For many people, injections are given daily or weekly for several weeks. Then, once B12 levels are normal, injections are given just once a month. If the cause of the deficiency can be fixed, the injections can be stopped. Talk with your healthcare provider about what you should expect. For an injection:  The injection site will be cleaned with an alcohol swab.  Your healthcare provider will insert a needle directly into a muscle. Most any muscle can be used. Most often, an arm muscle is used. A buttocks muscle can also be used. Many people say shots in that area are less painful.  A small adhesive bandage may be put over the injection site. It usually can be taken off in an hour or less. Injections can be given by your healthcare provider. In some cases, family members give them. Sometimes, people give them to themselves. Talk with your healthcare provider about what would be best for you. If someone other than your healthcare provider will be giving the shots, the person will need to be trained to give them correctly. HOME CARE INSTRUCTIONS   You can remove the adhesive bandage within an hour of getting a  shot.  You should be able to go about your normal activities right away.  Avoid drinking large amounts of alcohol while taking vitamin B12 shots. Alcohol can interfere with the body's use of the vitamin. SEEK MEDICAL CARE IF:   Pain, redness, swelling or tenderness at the injection site does not get better or gets worse.  Headache, dizziness or weakness does not go away.  You develop a fever of more than 100.5 F (38.1 C). SEEK IMMEDIATE MEDICAL CARE IF:   You have chest pain.  You develop shortness of breath.  You have muscle weakness that gets worse.  You develop numbness, weakness or tingling on one side or one area of the body.  You have symptoms of an allergic reaction, such as:  Hives.  Difficulty breathing.  Swelling of the lips, face, tongue or throat.  You develop a fever of more than 102.0 F (38.9 C). MAKE SURE YOU:   Understand these instructions.  Will watch your condition.  Will get help right away if you are not doing well or get worse. Document   Released: 05/15/2008 Document Revised: 05/11/2011 Document Reviewed: 05/15/2008 [employer] Patient Information 2014 [employer].  

## 2013-05-25 ENCOUNTER — Ambulatory Visit: Payer: Medicare Other

## 2013-05-29 ENCOUNTER — Other Ambulatory Visit: Payer: Self-pay | Admitting: Oncology

## 2013-05-29 ENCOUNTER — Emergency Department (HOSPITAL_COMMUNITY)
Admission: EM | Admit: 2013-05-29 | Discharge: 2013-05-29 | Disposition: A | Discharge status: Home / Self Care | Payer: Medicare Other | Attending: Emergency Medicine | Admitting: Emergency Medicine

## 2013-05-29 ENCOUNTER — Other Ambulatory Visit: Payer: Self-pay

## 2013-05-29 ENCOUNTER — Telehealth: Payer: Self-pay

## 2013-05-29 ENCOUNTER — Ambulatory Visit (HOSPITAL_BASED_OUTPATIENT_CLINIC_OR_DEPARTMENT_OTHER): Payer: Medicare Other

## 2013-05-29 VITALS — BP 200/101 | HR 96 | Temp 98.3°F

## 2013-05-29 DIAGNOSIS — E538 Deficiency of other specified B group vitamins: Secondary | ICD-10-CM

## 2013-05-29 DIAGNOSIS — Z8742 Personal history of other diseases of the female genital tract: Secondary | ICD-10-CM | POA: Insufficient documentation

## 2013-05-29 DIAGNOSIS — E7800 Pure hypercholesterolemia, unspecified: Secondary | ICD-10-CM | POA: Diagnosis not present

## 2013-05-29 DIAGNOSIS — E785 Hyperlipidemia, unspecified: Secondary | ICD-10-CM | POA: Diagnosis not present

## 2013-05-29 DIAGNOSIS — Z79899 Other long term (current) drug therapy: Secondary | ICD-10-CM | POA: Insufficient documentation

## 2013-05-29 DIAGNOSIS — I10 Essential (primary) hypertension: Secondary | ICD-10-CM | POA: Diagnosis not present

## 2013-05-29 DIAGNOSIS — Z8719 Personal history of other diseases of the digestive system: Secondary | ICD-10-CM | POA: Insufficient documentation

## 2013-05-29 DIAGNOSIS — Z8669 Personal history of other diseases of the nervous system and sense organs: Secondary | ICD-10-CM | POA: Insufficient documentation

## 2013-05-29 DIAGNOSIS — Z8739 Personal history of other diseases of the musculoskeletal system and connective tissue: Secondary | ICD-10-CM | POA: Insufficient documentation

## 2013-05-29 LAB — BASIC METABOLIC PANEL
BUN: 14 mg/dL (ref 6–23)
CALCIUM: 9.9 mg/dL (ref 8.4–10.5)
CHLORIDE: 101 meq/L (ref 96–112)
CO2: 25 mEq/L (ref 19–32)
CREATININE: 0.74 mg/dL (ref 0.50–1.10)
GFR calc Af Amer: >90 mL/min (ref >90)
GFR, EST NON AFRICAN AMERICAN: 81 mL/min — AB (ref >90)
Glucose, Bld: 98 mg/dL (ref 70–99)
Potassium: 4.1 mEq/L (ref 3.7–5.3)
Sodium: 140 mEq/L (ref 137–147)

## 2013-05-29 LAB — CBC
HEMATOCRIT: 44.3 % (ref 36.0–46.0)
Hemoglobin: 15 g/dL (ref 12.0–15.0)
MCH: 29.9 pg (ref 26.0–34.0)
MCHC: 33.9 g/dL (ref 30.0–36.0)
MCV: 88.4 fL (ref 78.0–100.0)
PLATELETS: 175 10*3/uL (ref 150–400)
RBC: 5.01 MIL/uL (ref 3.87–5.11)
RDW: 13.1 % (ref 11.5–15.5)
WBC: 4.2 10*3/uL (ref 4.0–10.5)

## 2013-05-29 LAB — URINALYSIS, ROUTINE W REFLEX MICROSCOPIC
BILIRUBIN URINE: NEGATIVE
Glucose, UA: NEGATIVE mg/dL
Hgb urine dipstick: NEGATIVE
Ketones, ur: NEGATIVE mg/dL
LEUKOCYTES UA: NEGATIVE
NITRITE: NEGATIVE
PH: 7.5 (ref 5.0–8.0)
PROTEIN: NEGATIVE mg/dL
Specific Gravity, Urine: 1.014 (ref 1.005–1.030)
UROBILINOGEN UA: 2 mg/dL — AB (ref 0.0–1.0)

## 2013-05-29 MED ORDER — CYANOCOBALAMIN 1000 MCG/ML IJ SOLN
1000.0000 ug | Freq: Once | INTRAMUSCULAR | Status: AC
  Administered 2013-05-29: 1000 ug via INTRAMUSCULAR

## 2013-05-29 MED ORDER — LABETALOL HCL 5 MG/ML IV SOLN
10.0000 mg | Freq: Once | INTRAVENOUS | Status: AC
  Administered 2013-05-29: 10 mg via INTRAVENOUS
  Filled 2013-05-29: qty 4

## 2013-05-29 NOTE — Discharge Instructions (Signed)

## 2013-05-29 NOTE — ED Notes (Signed)
Pt from cancer center with hypertension despite medications.

## 2013-05-29 NOTE — Telephone Encounter (Signed)
Thayer Headings LPN at [facility] said pt is there now with BP 200/101 P 96, face is flushed and eyes feel bad and watery; No CP, SOB,H/A or dizziness. Pt already has appt to see Dr Diona Browner on 05/30/13 at 9:30 AM. Spoke with Dr Lorelei Pont and advised to ask if oncologist or RN could do assessment while pt is there; Advised Thayer Headings will keep appt on schedule with Dr Diona Browner for 05/30/13 at 9:30 AM. Thayer Headings will get someone to assess pt.

## 2013-05-29 NOTE — Telephone Encounter (Signed)
Noted  

## 2013-05-29 NOTE — Progress Notes (Signed)
Patient here for Vitamin B-12 injection today.  When checking vital signs  BP was 200/101-96  Checked with manuel cuff. She isn't having any chest pain or SOB.  Only complaint is strange feeling in eyes and flushed skin.  She states that she had faxed a copy of her BP's to her PCP and got no response, about 7 weeks ago.  Her BP has been going up each time we have seen her.  I have instructed her to contact her PCP about her BP before today.  I called her PCP office to get an appointment soon since her next appointment isn't until end of July.  They gave her an appt for 05/30/13 at 9:30 am. I spoke to our on call MD, Dr Jana Hakim to ask his suggestion.  He said that she should go to the Emergency Room to be checked out.  Her sister-in-law is with her and will make sure she gets there.

## 2013-05-29 NOTE — ED Provider Notes (Signed)
TIME SEEN: 6:00 PM  CHIEF COMPLAINT: Hypertension  HPI: Patient is a 79 y.o. F with history of hyperlipidemia, hypertension on hydrochlorothiazide and lisinopril, vitamin B 12 deficiency presents to the emergency department from her doctor's office today with hypertension. Patient reports that her blood pressure normally runs 140s/70s but has been slowly increasing over the past several weeks. Today at her doctor's office it was 200/100. She states she's had "funny feeling" in her head but this is pending present for several months which family reports is do to a chronic brain tumor. No new headache. No vision changes. No chest pain or shortness of breath. No vomiting or diarrhea. No numbness or focal weakness. She states she took her blood pressure medication today. She states she was not told by nursing staff and her hematologist to come to the emergency department that she would not be here today. They scheduled her followup appointment with Dr. Eliezer Lofts tomorrow at 9:30 AM. She states that Dr. Diona Browner is aware that her blood pressure has been slowly increasing but they have not started her on any new medications were adjusted her current medicines.  ROS: See HPI Constitutional: no fever  Eyes: no drainage  ENT: no runny nose   Cardiovascular:  no chest pain  Resp: no SOB  GI: no vomiting GU: no dysuria Integumentary: no rash  Allergy: no hives  Musculoskeletal: no leg swelling  Neurological: no slurred speech ROS otherwise negative  PAST MEDICAL HISTORY/PAST SURGICAL HISTORY:  Past Medical History  Diagnosis Date  . Pure hypercholesterolemia   . Degeneration of lumbar or lumbosacral intervertebral disc   . Lumbago   . Female stress incontinence   . Paroxysmal supraventricular tachycardia   . Unspecified glaucoma(365.9)   . Diverticulosis of colon with hemorrhage     MEDICATIONS:  Prior to Admission medications   Medication Sig Start Date End Date Taking? Authorizing Provider   cholecalciferol (VITAMIN D) 400 UNITS TABS tablet Take 400 Units by mouth daily.   Yes Historical Provider, MD  brimonidine (ALPHAGAN) 0.2 % ophthalmic solution Place 1 drop into both eyes 2 (two) times daily. 02/17/11   Historical Provider, MD  hydrochlorothiazide (HYDRODIURIL) 25 MG tablet Take 1 tablet (25 mg total) by mouth daily. 08/19/12   Amy Cletis Athens, MD  latanoprost (XALATAN) 0.005 % ophthalmic solution  02/17/11   Historical Provider, MD  lisinopril (PRINIVIL,ZESTRIL) 40 MG tablet TAKE 1 TABLET BY MOUTH DAILY 07/12/12   Amy E Diona Browner, MD  pravastatin (PRAVACHOL) 80 MG tablet TAKE 1/2 TABLET BY MOUTH ONCE A DAY 08/16/12   Amy E Diona Browner, MD  solifenacin (VESICARE) 5 MG tablet Take 10 mg by mouth daily.    Historical Provider, MD  timolol (TIMOPTIC) 0.5 % ophthalmic solution  06/30/12   Historical Provider, MD  triamcinolone cream (KENALOG) 0.1 % Apply 1 application topically 2 (two) times daily. 03/28/13   Amy Cletis [place], MD  verapamil (CALAN-SR) 240 MG CR tablet TAKE 1 TABLET BY MOUTH AT BEDTIME 09/27/12   Amy Cletis Athens, MD    ALLERGIES:  Allergies  Allergen Reactions  . Codeine Phosphate     REACTION: unspecified  . Latex Hives, Itching and Rash    SOCIAL HISTORY:  History  Substance Use Topics  . Smoking status: Never Smoker   . Smokeless tobacco: Not on file  . Alcohol Use: No    FAMILY HISTORY: Family History  Problem Relation Age of Onset  . Aneurysm Mother   . Alzheimer's disease Mother   .  Cancer Father     prostate  . Cancer Sister   . Arthritis Sister   . Heart disease Brother   . Hypertension Brother   . Glaucoma Brother     EXAM: BP 196/91  Pulse 87  Temp(Src) 98.6 F (37 C) (Oral)  Resp 18  SpO2 97% CONSTITUTIONAL: Alert and oriented and responds appropriately to questions. Well-appearing; well-nourished, elderly, pleasant, no apparent distress HEAD: Normocephalic EYES: Conjunctivae clear, PERRL ENT: normal nose; no rhinorrhea; moist mucous  membranes; pharynx without lesions noted NECK: Supple, no meningismus, no LAD  CARD: RRR; S1 and S2 appreciated; no murmurs, no clicks, no rubs, no gallops RESP: Normal chest excursion without splinting or tachypnea; breath sounds clear and equal bilaterally; no wheezes, no rhonchi, no rales,  ABD/GI: Normal bowel sounds; non-distended; soft, non-tender, no rebound, no guarding BACK:  The back appears normal and is non-tender to palpation, there is no CVA tenderness EXT: Normal ROM in all joints; non-tender to palpation; no edema; normal capillary refill; no cyanosis    SKIN: Normal color for age and race; warm NEURO: Moves all extremities equally, cranial nerves II through XII intact, sensation to light touch intact diffusely PSYCH: The patient's mood and manner are appropriate. Grooming and personal hygiene are appropriate.  MEDICAL DECISION MAKING: Patient here with asymptomatic hypertension. Her EKG shows Q waves in inferior and anterior leads with no old for comparison. No LVH. She denies any neurologic deficits, headache, chest pain or shortness of breath currently. We'll check basic labs and urine to evaluate for signs of end organ damage. We'll give labetalol and reassess. She has followup tomorrow morning.  ED PROGRESS: Patient is still asymptomatic. Her blood pressure is improved after labetalol. Her labs and urine are unremarkable. She has followup with her primary care physician tomorrow.  No signs of end organ damage. We'll discharge patient home. I will not change her blood pressure medication at this time since she has followup tomorrow. Have given strict return precautions. Lesion in female bedside verbalize understanding and are comfortable with plan for discharge home.     Date: 05/29/2013 17:11  Rate: 88  Rhythm: normal sinus rhythm  QRS Axis:  First degree AV block, PR interval 239 ms  Intervals: normal  ST/T Wave abnormalities: normal  Conduction Disutrbances: none   Narrative Interpretation:  First degree AV block, Q waves in inferior and anterior leads, no old for comparison      [place], DO 05/29/13 1944

## 2013-05-30 ENCOUNTER — Ambulatory Visit (INDEPENDENT_AMBULATORY_CARE_PROVIDER_SITE_OTHER): Payer: Medicare Other | Admitting: Family Medicine

## 2013-05-30 ENCOUNTER — Encounter: Payer: Self-pay | Admitting: Family Medicine

## 2013-05-30 VITALS — BP 150/92 | HR 98 | Temp 98.5°F | Ht 63.0 in | Wt 187.8 lb

## 2013-05-30 DIAGNOSIS — I10 Essential (primary) hypertension: Secondary | ICD-10-CM | POA: Diagnosis not present

## 2013-05-30 DIAGNOSIS — R0789 Other chest pain: Secondary | ICD-10-CM | POA: Insufficient documentation

## 2013-05-30 MED ORDER — METOPROLOL SUCCINATE ER 50 MG PO TB24: 50.0000 mg | ORAL_TABLET | Freq: Every day | ORAL | Status: DC

## 2013-05-30 NOTE — Progress Notes (Signed)
Pre visit review using our clinic review tool, if applicable. No additional management support is needed unless otherwise documented below in the visit note. 

## 2013-05-30 NOTE — Assessment & Plan Note (Signed)
Poor control. Start BBlocker in addition to current meds.  Follow BP at home, recheck in 1 month.

## 2013-05-30 NOTE — Assessment & Plan Note (Signed)
Stable EKG per my records.  Last nml stress test > 10 years ago.  pt not interested in stress test at this itme, but if CP becoming exertional or  fatigue associated with walking up hill increasing... We will look into it.  Follow up in 1 month.

## 2013-05-30 NOTE — Patient Instructions (Signed)
Call if chest pain occuring with exertion or fatigue worsenening for evaluation with stress test. Start toprol XL  Daily. Follow up BP at home, goal < 140/90. Call if it remains elevated after 1 week.  Check HR.. Goal 60-90. Follow up in 1 month.

## 2013-05-30 NOTE — Progress Notes (Signed)
   Subjective:    Patient ID: Debra [name], female    DOB: 1934-06-18, 79 y.o.   MRN: [id number]  Hypertension Pertinent negatives include no chest pain, palpitations or shortness of breath.     79 year old female presents after ER visit yesterday for increase BP. She has a history of HTN and PSVT and recent issues with meningioma causing headaches. She is maintained on verapamil 240 mg daily, lisinopril 40 mg daily and HCTZ for BP control usually. (Max of these)  She was sent to ER by her hematologist since BP was measured at 200/100 At [place]. No neuro changes. Has been tired.  Mild headache yesterday after home from ER. She has noted some chest pain a rest, sharp, runs up chest last few sharp, Takes a baby aspirin.  Occuring  every few weeks. No associated SOB, sweating. She does give out more after walking up her hill in last few years.  EKG: showed first degree AV block, Q waves in inferior and anterior leads. No LVH.  Stable from 2007 and 2010.  In ER given labetolol. BP improved. Labs and UA:  Unremarkable No chronic meds were changes.  Had nml stress test several years ago.   Review of Systems  Constitutional: Negative for fever and fatigue.  HENT: Negative for ear pain.   Eyes: Negative for pain.  Respiratory: Negative for chest tightness and shortness of breath.   Cardiovascular: Negative for chest pain, palpitations and leg swelling.  Gastrointestinal: Negative for abdominal pain.  Genitourinary: Negative for dysuria.       Objective:   Physical Exam  Constitutional: Vital signs are normal. She appears well-developed and well-nourished. She is cooperative.  Non-toxic appearance. She does not appear ill. No distress.  HENT:  Head: Normocephalic.  Right Ear: Hearing, tympanic membrane, external ear and ear canal normal. Tympanic membrane is not erythematous, not retracted and not bulging.  Left Ear: Hearing, tympanic membrane, external ear and ear canal normal.  Tympanic membrane is not erythematous, not retracted and not bulging.  Nose: No mucosal edema or rhinorrhea. Right sinus exhibits no maxillary sinus tenderness and no frontal sinus tenderness. Left sinus exhibits no maxillary sinus tenderness and no frontal sinus tenderness.  Mouth/Throat: Uvula is midline, oropharynx is clear and moist and mucous membranes are normal.  Eyes: Conjunctivae, EOM and lids are normal. Pupils are equal, round, and reactive to light. Lids are everted and swept, no foreign bodies found.  Neck: Trachea normal and normal range of motion. Neck supple. Carotid bruit is not present. No mass and no thyromegaly present.  Cardiovascular: Normal rate, regular rhythm, S1 normal, S2 normal, normal heart sounds, intact distal pulses and normal pulses.  Exam reveals no gallop and no friction rub.   No murmur heard. Pulmonary/Chest: Effort normal and breath sounds normal. Not tachypneic. No respiratory distress. She has no decreased breath sounds. She has no wheezes. She has no rhonchi. She has no rales.  Abdominal: Soft. Normal appearance and bowel sounds are normal. There is no tenderness.  Neurological: She is alert.  Skin: Skin is warm, dry and intact. No rash noted.  Psychiatric: Her speech is normal and behavior is normal. Judgment and thought content normal. Her mood appears not anxious. Cognition and memory are normal. She does not exhibit a depressed mood.          Assessment & Plan:

## 2013-06-05 ENCOUNTER — Telehealth: Payer: Self-pay

## 2013-06-05 NOTE — Telephone Encounter (Signed)
Pt was seen 05/30/13 and started metoprolol; pt said yesterday BP when got up and took metoprolol was 173/87, two hours later after taking med BP was 116/84 P 68 and yesterday evening BP 180/95 P 62. This morning BP 172/93 P 67  But pt has not taken metoprolol this morning. Pt is very weak and sleepy. No CP,SOB,H/A or dizziness. Pt wants to know what to do about taking med. Pt request cb. [facility] [place].

## 2013-06-05 NOTE — Telephone Encounter (Signed)
Please verify with pt that she received toprol XL ( lasts 24hours as opposed to short acting meto[prolol. Also obtain more BP measurements. Does she feel the med is making her weak and sleepy?

## 2013-06-06 MED ORDER — ATENOLOL 50 MG PO TABS: 50.0000 mg | ORAL_TABLET | Freq: Every day | ORAL | Status: DC

## 2013-06-06 NOTE — Telephone Encounter (Signed)
Mr. Squibb notified as instructed by telephone.  Follow up scheduled for 06/20/2013 @ 3:15pm.  Will call me next week with blood pressure reading or sooner if side effects continue.

## 2013-06-06 NOTE — Telephone Encounter (Signed)
Spoke with Debra [name].  She states her medication bottle reads Metoprolol Succinate 50 mg.  Her blood pressure readings on 06/05/2013 at 7:30am was 172/93, 11:00am 138/79 and 8:30pm 142/81.  Today at 7:30 am 141/97.  Debra [name] states she does feel like it is the medication that is making her weak and sleepy.  Please advise.

## 2013-06-06 NOTE — Telephone Encounter (Signed)
Stop metoprolol and change to atenolol daily. Call in 1 week with BP measurements or SE. If still with side effects to this we can change to another BP med. .  Have pt make BP follow up appt in 2 weeks.

## 2013-06-07 DIAGNOSIS — H4011X0 Primary open-angle glaucoma, stage unspecified: Secondary | ICD-10-CM | POA: Diagnosis not present

## 2013-06-07 DIAGNOSIS — H409 Unspecified glaucoma: Secondary | ICD-10-CM | POA: Diagnosis not present

## 2013-06-20 ENCOUNTER — Encounter: Payer: Self-pay | Admitting: Family Medicine

## 2013-06-20 ENCOUNTER — Ambulatory Visit (INDEPENDENT_AMBULATORY_CARE_PROVIDER_SITE_OTHER): Payer: Medicare Other | Admitting: Family Medicine

## 2013-06-20 VITALS — BP 142/90 | HR 64 | Temp 98.1°F | Ht 63.0 in | Wt 189.5 lb

## 2013-06-20 DIAGNOSIS — I10 Essential (primary) hypertension: Secondary | ICD-10-CM

## 2013-06-20 DIAGNOSIS — R0789 Other chest pain: Secondary | ICD-10-CM | POA: Diagnosis not present

## 2013-06-20 MED ORDER — CLONIDINE HCL 0.1 MG PO TABS: 0.1000 mg | ORAL_TABLET | Freq: Two times a day (BID) | ORAL | Status: DC

## 2013-06-20 NOTE — Patient Instructions (Addendum)
Add clonidine tabs twice  A day to regimen.Make sure it is taken the same time every day. Continue all your medications.  Call if any side effects.  Follow BP at home..  Follow up in 2 weeks. Cancel 1 month follow up.  Keep MCW in 08/2013.

## 2013-06-20 NOTE — Progress Notes (Signed)
   Subjective:    Patient ID: Debra [name], female    DOB: 04-16-1934, 79 y.o.   MRN: [id number]  HPI   79 year old female presents for follow up BP.   Improved control on  Calan SR 240 mg daily, HCTZ 25 mg, lisinopril 40 daily with new atenolol 50 mg daily. She did not tolerate metoprolol.  Still some elevated 145/92-98.   HR 64 BP Readings from Last 3 Encounters:  06/20/13 142/90  05/30/13 150/92  05/29/13 175/68   At last OV: Atypical chest pain - Jinny Sanders, MD at 05/30/2013 10:20 AM      Stable EKG per my records.  Last nml stress test > 10 years ago.  pt not interested in stress test at this itme, but if CP becoming exertional or  fatigue associated with walking up hill increasing... We will look into it.  Follow up in 1 month.    Se reports no further chest pain.    Review of Systems  Constitutional: Negative for fever and fatigue.  HENT: Negative for ear pain.   Eyes: Negative for pain.  Respiratory: Negative for chest tightness and shortness of breath.   Cardiovascular: Negative for chest pain, palpitations and leg swelling.  Gastrointestinal: Negative for abdominal pain.  Genitourinary: Negative for dysuria.       Objective:   Physical Exam  Constitutional: Vital signs are normal. She appears well-developed and well-nourished. She is cooperative.  Non-toxic appearance. She does not appear ill. No distress.  HENT:  Head: Normocephalic.  Right Ear: Hearing, tympanic membrane, external ear and ear canal normal. Tympanic membrane is not erythematous, not retracted and not bulging.  Left Ear: Hearing, tympanic membrane, external ear and ear canal normal. Tympanic membrane is not erythematous, not retracted and not bulging.  Nose: No mucosal edema or rhinorrhea. Right sinus exhibits no maxillary sinus tenderness and no frontal sinus tenderness. Left sinus exhibits no maxillary sinus tenderness and no frontal sinus tenderness.  Mouth/Throat: Uvula is midline,  oropharynx is clear and moist and mucous membranes are normal.  Eyes: Conjunctivae, EOM and lids are normal. Pupils are equal, round, and reactive to light. Lids are everted and swept, no foreign bodies found.  Neck: Trachea normal and normal range of motion. Neck supple. Carotid bruit is not present. No mass and no thyromegaly present.  Cardiovascular: Normal rate, regular rhythm, S1 normal, S2 normal, normal heart sounds, intact distal pulses and normal pulses.  Exam reveals no gallop and no friction rub.   No murmur heard. Pulmonary/Chest: Effort normal and breath sounds normal. Not tachypneic. No respiratory distress. She has no decreased breath sounds. She has no wheezes. She has no rhonchi. She has no rales.  Abdominal: Soft. Normal appearance and bowel sounds are normal. There is no tenderness.  Neurological: She is alert.  Skin: Skin is warm, dry and intact. No rash noted.  Psychiatric: Her speech is normal and behavior is normal. Judgment and thought content normal. Her mood appears not anxious. Cognition and memory are normal. She does not exhibit a depressed mood.          Assessment & Plan:

## 2013-06-20 NOTE — Progress Notes (Signed)
Pre visit review using our clinic review tool, if applicable. No additional management support is needed unless otherwise documented below in the visit note. 

## 2013-06-22 ENCOUNTER — Ambulatory Visit: Payer: Medicare Other

## 2013-06-26 ENCOUNTER — Ambulatory Visit (HOSPITAL_BASED_OUTPATIENT_CLINIC_OR_DEPARTMENT_OTHER): Payer: Medicare Other

## 2013-06-26 VITALS — BP 183/75 | HR 60 | Temp 98.7°F

## 2013-06-26 DIAGNOSIS — E538 Deficiency of other specified B group vitamins: Secondary | ICD-10-CM

## 2013-06-26 MED ORDER — CYANOCOBALAMIN 1000 MCG/ML IJ SOLN
1000.0000 ug | Freq: Once | INTRAMUSCULAR | Status: AC
  Administered 2013-06-26: 1000 ug via INTRAMUSCULAR

## 2013-07-04 ENCOUNTER — Encounter: Payer: Self-pay | Admitting: Family Medicine

## 2013-07-04 ENCOUNTER — Ambulatory Visit (INDEPENDENT_AMBULATORY_CARE_PROVIDER_SITE_OTHER): Payer: Medicare Other | Admitting: Family Medicine

## 2013-07-04 VITALS — BP 134/69 | HR 56 | Temp 98.5°F | Ht 63.0 in | Wt 191.5 lb

## 2013-07-04 DIAGNOSIS — I10 Essential (primary) hypertension: Secondary | ICD-10-CM | POA: Diagnosis not present

## 2013-07-04 NOTE — Assessment & Plan Note (Signed)
Well controlled. Continue current medication.  

## 2013-07-04 NOTE — Progress Notes (Signed)
Pre visit review using our clinic review tool, if applicable. No additional management support is needed unless otherwise documented below in the visit note. 

## 2013-07-04 NOTE — Progress Notes (Signed)
   Subjective:    Patient ID: Debra [name], female    DOB: 13-Sep-1934, 79 y.o.   MRN: [id number]  HPI  79 year old female presents for 2 week follow up HTN   Hypertension:   Since last OV clonidine BID was added 0.1 mg dose.   Also on Calan SR 240 mg daily, HCTZ 25 mg, lisinopril 40 daily,  atenolol 50 mg daily.  BP Readings from Last 3 Encounters:  07/04/13 134/69  06/26/13 183/75  06/20/13 142/90  Using medication without problems or lightheadedness:  none Chest pain with exertion: None Edema:None Short of breath:None Average home BPs: 135/74 HR 60s Other issues:    Review of Systems  Constitutional: Negative for fever and fatigue.  Respiratory: Negative for shortness of breath.   Cardiovascular: Negative for chest pain.       Objective:   Physical Exam  Constitutional: She appears well-developed.  overweight  Neck: Normal range of motion. Neck supple. No thyromegaly present.  No bruit  Cardiovascular: Normal rate.   No murmur heard. Pulmonary/Chest: Effort normal and breath sounds normal.          Assessment & Plan:

## 2013-07-04 NOTE — Patient Instructions (Signed)
Work on exercise, weight loss, healthy eating habits.  Follow BP at home, goal BP < 140/90.  Keep follow for wellness as scheduled.

## 2013-07-07 NOTE — Assessment & Plan Note (Signed)
Improved

## 2013-07-07 NOTE — Assessment & Plan Note (Signed)
Add clonidine to regimen given elevated number frequently at home.  Encouraged exercise, weight loss, healthy eating habits. Follow up in 2 weeks for recheck.

## 2013-07-20 ENCOUNTER — Ambulatory Visit: Payer: Medicare Other

## 2013-07-25 ENCOUNTER — Ambulatory Visit (HOSPITAL_BASED_OUTPATIENT_CLINIC_OR_DEPARTMENT_OTHER): Payer: Medicare Other

## 2013-07-25 VITALS — BP 186/69 | HR 64 | Temp 98.3°F

## 2013-07-25 DIAGNOSIS — E538 Deficiency of other specified B group vitamins: Secondary | ICD-10-CM

## 2013-07-25 MED ORDER — CYANOCOBALAMIN 1000 MCG/ML IJ SOLN
1000.0000 ug | Freq: Once | INTRAMUSCULAR | Status: AC
  Administered 2013-07-25: 1000 ug via INTRAMUSCULAR

## 2013-07-25 NOTE — Patient Instructions (Signed)
Vitamin B12 Injections Every person needs vitamin B12. A deficiency develops when the body does not get enough of it. One way to overcome this is by getting B12 shots (injections). A B12 shot puts the vitamin directly into muscle tissue. This avoids any problems your body might have in absorbing it from food or a pill. In some people, the body has trouble using the vitamin correctly. This can cause a B12 deficiency. Not consuming enough of the vitamin can also cause a deficiency. Getting enough vitamin B12 can be hard for elderly people. Sometimes, they do not eat a well-balanced diet. The elderly are also more likely than younger people to have medical conditions or take medications that can lead to a deficiency. WHAT DOES VITAMIN B12 DO? Vitamin B12 does many things to help the body work right:  It helps the body make healthy red blood cells.  It helps maintain nerve cells.  It is involved in the body's process of converting food into energy (metabolism).  It is needed to make the genetic material in all cells (DNA). VITAMIN B12 FOOD SOURCES Most people get plenty of vitamin B12 through the foods they eat. It is present in:  Meat, fish, poultry, and eggs.  Milk and milk products.  It also is added when certain foods are made, including some breads, cereals and yogurts. The food is then called "fortified". CAUSES The most common causes of vitamin B12 deficiency are:  Pernicious anemia. The condition develops when the body cannot make enough healthy red blood cells. This stems from a lack of a protein made in the stomach (intrinsic factor). People without this protein cannot absorb enough vitamin B12 from food.  Malabsorption. This is when the body cannot absorb the vitamin. It can be caused by:  Pernicious anemia.  Surgery to remove part or all of the stomach can lead to malabsorption. Removal of part or all of the small intestine can also cause malabsorption.  Vegetarian diet.  People who are strict about not eating foods from animals could have trouble taking in enough vitamin B12 from diet alone.  Medications. Some medicines have been linked to B12 deficiency, such as Metformin (a drug prescribed for type 2 diabetes). Long-term use of stomach acid suppressants also can keep the vitamin from being absorbed.  Intestinal problems such as inflammatory bowel disease. If there are problems in the digestive tract, vitamin B12 may not be absorbed in good enough amounts. SYMPTOMS People who do not get enough B12 can develop problems. These can include:  Anemia. This is when the body has too few red blood cells. Red blood cells carry oxygen to the rest of the body. Without a healthy supply of red blood cells, people can feel:  Tired (fatigued).  Weak.  Severe anemia can cause:  Shortness of breath.  Dizziness.  Rapid heart rate.  Paleness.  Other Vitamin B12 deficiency symptoms include:  Diarrhea.  Numbness or tingling in the hands or feet.  Loss of appetite.  Confusion.  Sores on the tongue or in the mouth. LET YOUR CAREGIVER KNOW ABOUT:  Any allergies. It is very important to know if you are allergic or sensitive to cobalt. Vitamin B12 contains cobalt.  Any history of kidney disease.  All medications you are taking. Include prescription and over-the-counter medicines, herbs and creams.  Whether you are pregnant or breast-feeding.  If you have Leber's disease, a hereditary eye condition, vitamin B12 could make it worse. RISKS AND COMPLICATIONS Reactions to an injection are   usually temporary. They might include:  Pain at the injection site.  Redness, swelling or tenderness at the site.  Headache, dizziness or weakness.  Nausea, upset stomach or diarrhea.  Numbness or tingling.  Fever.  Joint pain.  Itching or rash. If a reaction does not go away in a short while, talk with your healthcare provider. A change in the way the shots are  given, or where they are given, might need to be made. BEFORE AN INJECTION To decide whether B12 injections are right for you, your healthcare provider will probably:  Ask about your medical history.  Ask questions about your diet.  Ask about symptoms such as:  Have you felt weak?  Do you feel unusually tired?  Do you get dizzy?  Order blood tests. These may include a test to:  Check the level of red cells in your blood.  Measure B12 levels.  Check for the presence of intrinsic factor. VITAMIN B12 INJECTIONS How often you will need a vitamin B12 injection will depend on how severe your deficiency is. This also will affect how long you will need to get them. People with pernicious anemia usually get injections for their entire life. Others might get them for a shorter period. For many people, injections are given daily or weekly for several weeks. Then, once B12 levels are normal, injections are given just once a month. If the cause of the deficiency can be fixed, the injections can be stopped. Talk with your healthcare provider about what you should expect. For an injection:  The injection site will be cleaned with an alcohol swab.  Your healthcare provider will insert a needle directly into a muscle. Most any muscle can be used. Most often, an arm muscle is used. A buttocks muscle can also be used. Many people say shots in that area are less painful.  A small adhesive bandage may be put over the injection site. It usually can be taken off in an hour or less. Injections can be given by your healthcare provider. In some cases, family members give them. Sometimes, people give them to themselves. Talk with your healthcare provider about what would be best for you. If someone other than your healthcare provider will be giving the shots, the person will need to be trained to give them correctly. HOME CARE INSTRUCTIONS   You can remove the adhesive bandage within an hour of getting a  shot.  You should be able to go about your normal activities right away.  Avoid drinking large amounts of alcohol while taking vitamin B12 shots. Alcohol can interfere with the body's use of the vitamin. SEEK MEDICAL CARE IF:   Pain, redness, swelling or tenderness at the injection site does not get better or gets worse.  Headache, dizziness or weakness does not go away.  You develop a fever of more than 100.5 F (38.1 C). SEEK IMMEDIATE MEDICAL CARE IF:   You have chest pain.  You develop shortness of breath.  You have muscle weakness that gets worse.  You develop numbness, weakness or tingling on one side or one area of the body.  You have symptoms of an allergic reaction, such as:  Hives.  Difficulty breathing.  Swelling of the lips, face, tongue or throat.  You develop a fever of more than 102.0 F (38.9 C). MAKE SURE YOU:   Understand these instructions.  Will watch your condition.  Will get help right away if you are not doing well or get worse. Document   Released: 05/15/2008 Document Revised: 05/11/2011 Document Reviewed: 05/15/2008 [employer] Patient Information 2014 [employer].  

## 2013-08-06 ENCOUNTER — Emergency Department (HOSPITAL_COMMUNITY): Payer: Medicare Other

## 2013-08-06 ENCOUNTER — Emergency Department (HOSPITAL_COMMUNITY)
Admission: EM | Admit: 2013-08-06 | Discharge: 2013-08-06 | Disposition: A | Discharge status: Home / Self Care | Payer: Medicare Other | Attending: Emergency Medicine | Admitting: Emergency Medicine

## 2013-08-06 ENCOUNTER — Encounter (HOSPITAL_COMMUNITY): Payer: Self-pay | Admitting: Emergency Medicine

## 2013-08-06 DIAGNOSIS — Z8719 Personal history of other diseases of the digestive system: Secondary | ICD-10-CM | POA: Diagnosis not present

## 2013-08-06 DIAGNOSIS — Z79899 Other long term (current) drug therapy: Secondary | ICD-10-CM | POA: Diagnosis not present

## 2013-08-06 DIAGNOSIS — K8020 Calculus of gallbladder without cholecystitis without obstruction: Secondary | ICD-10-CM | POA: Diagnosis not present

## 2013-08-06 DIAGNOSIS — R159 Full incontinence of feces: Secondary | ICD-10-CM | POA: Insufficient documentation

## 2013-08-06 DIAGNOSIS — Z8739 Personal history of other diseases of the musculoskeletal system and connective tissue: Secondary | ICD-10-CM | POA: Diagnosis not present

## 2013-08-06 DIAGNOSIS — K5730 Diverticulosis of large intestine without perforation or abscess without bleeding: Secondary | ICD-10-CM | POA: Diagnosis not present

## 2013-08-06 DIAGNOSIS — Z8639 Personal history of other endocrine, nutritional and metabolic disease: Secondary | ICD-10-CM | POA: Insufficient documentation

## 2013-08-06 DIAGNOSIS — R197 Diarrhea, unspecified: Secondary | ICD-10-CM | POA: Insufficient documentation

## 2013-08-06 DIAGNOSIS — H409 Unspecified glaucoma: Secondary | ICD-10-CM | POA: Insufficient documentation

## 2013-08-06 DIAGNOSIS — N750 Cyst of Bartholin's gland: Secondary | ICD-10-CM | POA: Insufficient documentation

## 2013-08-06 DIAGNOSIS — Z862 Personal history of diseases of the blood and blood-forming organs and certain disorders involving the immune mechanism: Secondary | ICD-10-CM | POA: Insufficient documentation

## 2013-08-06 DIAGNOSIS — Z90710 Acquired absence of both cervix and uterus: Secondary | ICD-10-CM | POA: Diagnosis not present

## 2013-08-06 LAB — BASIC METABOLIC PANEL
BUN: 12 mg/dL (ref 6–23)
CHLORIDE: 102 meq/L (ref 96–112)
CO2: 28 mEq/L (ref 19–32)
CREATININE: 0.78 mg/dL (ref 0.50–1.10)
Calcium: 9.1 mg/dL (ref 8.4–10.5)
GFR calc non Af Amer: 79 mL/min — ABNORMAL LOW (ref >90)
Glucose, Bld: 107 mg/dL — ABNORMAL HIGH (ref 70–99)
POTASSIUM: 3.9 meq/L (ref 3.7–5.3)
Sodium: 140 mEq/L (ref 137–147)

## 2013-08-06 LAB — URINALYSIS, ROUTINE W REFLEX MICROSCOPIC
BILIRUBIN URINE: NEGATIVE
Glucose, UA: NEGATIVE mg/dL
Hgb urine dipstick: NEGATIVE
KETONES UR: NEGATIVE mg/dL
NITRITE: NEGATIVE
Protein, ur: NEGATIVE mg/dL
SPECIFIC GRAVITY, URINE: 1.01 (ref 1.005–1.030)
Urobilinogen, UA: 4 mg/dL — ABNORMAL HIGH (ref 0.0–1.0)
pH: 6.5 (ref 5.0–8.0)

## 2013-08-06 LAB — CBC WITH DIFFERENTIAL/PLATELET
BASOS ABS: 0 10*3/uL (ref 0.0–0.1)
BASOS PCT: 0 % (ref 0–1)
EOS ABS: 0.1 10*3/uL (ref 0.0–0.7)
Eosinophils Relative: 1 % (ref 0–5)
HCT: 39.9 % (ref 36.0–46.0)
HEMOGLOBIN: 13.2 g/dL (ref 12.0–15.0)
Lymphocytes Relative: 15 % (ref 12–46)
Lymphs Abs: 1 10*3/uL (ref 0.7–4.0)
MCH: 29.1 pg (ref 26.0–34.0)
MCHC: 33.1 g/dL (ref 30.0–36.0)
MCV: 87.9 fL (ref 78.0–100.0)
Monocytes Absolute: 0.5 10*3/uL (ref 0.1–1.0)
Monocytes Relative: 8 % (ref 3–12)
NEUTROS ABS: 4.9 10*3/uL (ref 1.7–7.7)
NEUTROS PCT: 76 % (ref 43–77)
Platelets: 159 10*3/uL (ref 150–400)
RBC: 4.54 MIL/uL (ref 3.87–5.11)
RDW: 13.3 % (ref 11.5–15.5)
WBC: 6.5 10*3/uL (ref 4.0–10.5)

## 2013-08-06 LAB — POC OCCULT BLOOD, ED: Fecal Occult Bld: NEGATIVE

## 2013-08-06 LAB — URINE MICROSCOPIC-ADD ON

## 2013-08-06 MED ORDER — IOHEXOL 300 MG/ML  SOLN
100.0000 mL | Freq: Once | INTRAMUSCULAR | Status: AC | PRN
  Administered 2013-08-06: 100 mL via INTRAVENOUS

## 2013-08-06 MED ORDER — CIPROFLOXACIN HCL 500 MG PO TABS: 500.0000 mg | ORAL_TABLET | Freq: Two times a day (BID) | ORAL | Status: DC

## 2013-08-06 MED ORDER — LOPERAMIDE HCL 2 MG PO CAPS: 2.0000 mg | ORAL_CAPSULE | ORAL | Status: DC | PRN

## 2013-08-06 MED ORDER — METRONIDAZOLE 500 MG PO TABS: 500.0000 mg | ORAL_TABLET | Freq: Two times a day (BID) | ORAL | Status: DC

## 2013-08-06 NOTE — Discharge Instructions (Signed)
Bartholin's Cyst or Abscess Bartholin's glands are small glands located within the folds of skin (labia) along the sides of the lower opening of the vagina (birth canal). A cyst may develop when the duct of the gland becomes blocked. When this happens, fluid that accumulates within the cyst can become infected. This is known as an abscess. The Bartholin gland produces a mucous fluid to lubricate the outside of the vagina during sexual intercourse. SYMPTOMS   Patients with a small cyst may not have any symptoms.  Mild discomfort to severe pain depending on the size of the cyst and if it is infected (abscess).  Pain, redness, and swelling around the lower opening of the vagina.  Painful intercourse.  Pressure in the perineal area.  Swelling of the lips of the vagina (labia).  The cyst or abscess can be on one side or both sides of the vagina. DIAGNOSIS   A large swelling is seen in the lower vagina area by your caregiver.  Painful to touch.  Redness and pain, if it is an abscess. TREATMENT   Sometimes the cyst will go away on its own.  Apply warm wet compresses to the area or take hot sitz baths several times a day.  An incision to drain the cyst or abscess with local anesthesia.  Culture the pus, if it is an abscess.  Antibiotic treatment, if it is an abscess.  Cut open the gland and suture the edges to make the opening of the gland bigger (marsupialization).  Remove the whole gland if the cyst or abscess returns. PREVENTION   Practice good hygiene.  Clean the vaginal area with a mild soap and soft cloth when bathing.  Do not rub hard in the vaginal area when bathing.  Protect the crotch area with a padded cushion if you take long bike rides or ride horses.  Be sure you are well lubricated when you have sexual intercourse. HOME CARE INSTRUCTIONS   If your cyst or abscess was opened, a small piece of gauze, or a drain, may have been placed in the wound to allow  drainage. Do not remove this gauze or drain unless directed by your caregiver.  Wear feminine pads, not tampons, as needed for any drainage or bleeding.  If antibiotics were prescribed, take them exactly as directed. Finish the entire course.  Only take over-the-counter or prescription medicines for pain, discomfort, or fever as directed by your caregiver. SEEK IMMEDIATE MEDICAL CARE IF:   You have an increase in pain, redness, swelling, or drainage.  You have bleeding from the wound which results in the use of more than the number of pads suggested by your caregiver in 24 hours.  You have chills.  You have a fever.  You develop any new problems (symptoms) or aggravation of your existing condition. MAKE SURE YOU:   Understand these instructions.  Will watch your condition.  Will get help right away if you are not doing well or get worse. Document Released: 02/16/2005 Document Revised: 05/11/2011 Document Reviewed: 10/05/2007 [facility] Patient Information 2014 [place]. Diarrhea Diarrhea is frequent loose and watery bowel movements. It can cause you to feel weak and dehydrated. Dehydration can cause you to become tired and thirsty, have a dry mouth, and have decreased urination that often is dark yellow. Diarrhea is a sign of another problem, most often an infection that will not last long. In most cases, diarrhea typically lasts 2 3 days. However, it can last longer if it is a sign  of something more serious. It is important to treat your diarrhea as directed by your caregive to lessen or prevent future episodes of diarrhea. CAUSES  Some common causes include:  Gastrointestinal infections caused by viruses, bacteria, or parasites.  Food poisoning or food allergies.  Certain medicines, such as antibiotics, chemotherapy, and laxatives.  Artificial sweeteners and fructose.  Digestive disorders. HOME CARE INSTRUCTIONS  Ensure adequate fluid intake (hydration): have 1 cup  (8 oz) of fluid for each diarrhea episode. Avoid fluids that contain simple sugars or sports drinks, fruit juices, whole milk products, and sodas. Your urine should be clear or pale yellow if you are drinking enough fluids. Hydrate with an oral rehydration solution that you can purchase at pharmacies, retail stores, and online. You can prepare an oral rehydration solution at home by mixing the following ingredients together:    tsp table salt.   tsp baking soda.   tsp salt substitute containing potassium chloride.  1  tablespoons sugar.  1 L (34 oz) of water.  Certain foods and beverages may increase the speed at which food moves through the gastrointestinal (GI) tract. These foods and beverages should be avoided and include:  Caffeinated and alcoholic beverages.  High-fiber foods, such as raw fruits and vegetables, nuts, seeds, and whole grain breads and cereals.  Foods and beverages sweetened with sugar alcohols, such as xylitol, sorbitol, and mannitol.  Some foods may be well tolerated and may help thicken stool including:  Starchy foods, such as rice, toast, pasta, low-sugar cereal, oatmeal, grits, baked potatoes, crackers, and bagels.  Bananas.  Applesauce.  Add probiotic-rich foods to help increase healthy bacteria in the GI tract, such as yogurt and fermented milk products.  Wash your hands well after each diarrhea episode.  Only take over-the-counter or prescription medicines as directed by your caregiver.  Take a warm bath to relieve any burning or pain from frequent diarrhea episodes. SEEK IMMEDIATE MEDICAL CARE IF:   You are unable to keep fluids down.  You have persistent vomiting.  You have blood in your stool, or your stools are black and tarry.  You do not urinate in 6 8 hours, or there is only a small amount of very dark urine.  You have abdominal pain that increases or localizes.  You have weakness, dizziness, confusion, or lightheadedness.  You have  a severe headache.  Your diarrhea gets worse or does not get better.  You have a fever or persistent symptoms for more than 2 3 days.  You have a fever and your symptoms suddenly get worse. MAKE SURE YOU:   Understand these instructions.  Will watch your condition.  Will get help right away if you are not doing well or get worse. Document Released: 02/06/2002 Document Revised: 02/03/2012 Document Reviewed: 10/25/2011 [facility] Patient Information 2014 [place], [place].

## 2013-08-06 NOTE — ED Notes (Signed)
Pt reports bowel incontinence x 3 weeks. States "I was dealing with constipation so they started me on Miralax. Now I go to the bathroom and don't even know it." Denies blood in stool. Pt also reports swelling to right side of groin x 4 days. Reports pain to groin area. Denies dysuria. AO x4.

## 2013-08-06 NOTE — ED Provider Notes (Signed)
Pt seen and examined.  Hx reviewed.  Diarrhea for 3 weeks after using Miralax daily for 7 days.  Intermittent rectal incontinence. No blood.  No recent antibiotic use.  Now with red painful area in perineum.  Exam shows errythema posterior to labia.  Palpable mass/fluctuance/?abscess to rt perineum post to labia, ant to anus.  Plan Ct with Rectal contrast.  Pt with good rectal tone.  No exam findings or history to suggest neurological etiology for incontinence.  Tanna Furry, MD 08/06/13 [phone]

## 2013-08-06 NOTE — ED Provider Notes (Signed)
CSN: [id number]     Arrival date & time 08/06/13  1433 History   First MD Initiated Contact with Patient 08/06/13 1502     Chief Complaint  Patient presents with  . Encopresis  . Groin Swelling     (Consider location/radiation/quality/duration/timing/severity/associated sxs/prior Treatment) HPI Comments: Patient presents to the ED with a chief complaint of lower abdominal pain, swelling, and bowel incontinence.  This is a new problem, which started about 1 week ago.  She states that she was feeling very constipated, and was put on Miralax.  She states that since then she has had difficulty controlling her bowels.  Additionally, she complains of swelling of the labia.  She denies fevers, chills, vomiting, dysuria, or vaginal discharge.  She endorses nausea.  She states that she is scheduled to see her PCP tomorrow.  The history is provided by the patient. No language interpreter was used.    Past Medical History  Diagnosis Date  . Pure hypercholesterolemia   . Degeneration of lumbar or lumbosacral intervertebral disc   . Lumbago   . Female stress incontinence   . Paroxysmal supraventricular tachycardia   . Unspecified glaucoma   . Diverticulosis of colon with hemorrhage    Past Surgical History  Procedure Laterality Date  . Abdominal hysterectomy     Family History  Problem Relation Age of Onset  . Aneurysm Mother   . Alzheimer's disease Mother   . Cancer Father     prostate  . Cancer Sister   . Arthritis Sister   . Heart disease Brother   . Hypertension Brother   . Glaucoma Brother    History  Substance Use Topics  . Smoking status: Never Smoker   . Smokeless tobacco: Never Used  . Alcohol Use: No   OB History   Grav Para Term Preterm Abortions TAB SAB Ect Mult Living                 Review of Systems  Constitutional: Negative for fever and chills.  Respiratory: Negative for shortness of breath.   Cardiovascular: Negative for chest pain.  Gastrointestinal:  Positive for nausea and abdominal pain. Negative for vomiting, diarrhea and constipation.  Genitourinary: Negative for dysuria.       Vaginal pain      Allergies  Codeine phosphate; Sulfa antibiotics; and Latex  Home Medications   Prior to Admission medications   Medication Sig Start Date End Date Taking? Authorizing Provider  atenolol (TENORMIN) 50 MG tablet Take 1 tablet (50 mg total) by mouth daily. 06/06/13   Amy Cletis Athens, MD  brimonidine (ALPHAGAN) 0.2 % ophthalmic solution Place 1 drop into both eyes 2 (two) times daily. 02/17/11   Historical Provider, MD  cholecalciferol (VITAMIN D) 400 UNITS TABS tablet Take 400 Units by mouth daily.    Historical Provider, MD  cloNIDine (CATAPRES) 0.1 MG tablet Take 1 tablet (0.1 mg total) by mouth 2 (two) times daily. 06/20/13   Amy Cletis Athens, MD  hydrochlorothiazide (HYDRODIURIL) 25 MG tablet Take 1 tablet (25 mg total) by mouth daily. 08/19/12   Amy Cletis Athens, MD  latanoprost (XALATAN) 0.005 % ophthalmic solution Place 1 drop into both eyes at bedtime.  02/17/11   Historical Provider, MD  lisinopril (PRINIVIL,ZESTRIL) 40 MG tablet Take 40 mg by mouth daily.    Historical Provider, MD  metoprolol succinate (TOPROL-XL) 50 MG 24 hr tablet  05/30/13   Historical Provider, MD  pravastatin (PRAVACHOL) 80 MG tablet Take 40 mg by mouth  daily.    Historical Provider, MD  solifenacin (VESICARE) 5 MG tablet Take 10 mg by mouth daily.    Historical Provider, MD  timolol (TIMOPTIC) 0.5 % ophthalmic solution  06/30/12   Historical Provider, MD  triamcinolone cream (KENALOG) 0.1 % Apply 1 application topically 2 (two) times daily. 03/28/13   Amy Cletis Athens, MD  verapamil (CALAN-SR) 240 MG CR tablet Take 240 mg by mouth at bedtime.    Historical Provider, MD   BP 140/59  Pulse 64  Temp(Src) 99.6 F (37.6 C) (Oral)  Resp 21  SpO2 99% Physical Exam  Nursing note and vitals reviewed. Constitutional: She is oriented to person, place, and time. She appears  well-developed and well-nourished. No distress.  HENT:  Head: Normocephalic and atraumatic.  Eyes: Conjunctivae and EOM are normal. Pupils are equal, round, and reactive to light. Right eye exhibits no discharge. Left eye exhibits no discharge. No scleral icterus.  Neck: Normal range of motion. Neck supple. No tracheal deviation present.  Cardiovascular: Normal rate, regular rhythm and normal heart sounds.  Exam reveals no gallop and no friction rub.   No murmur heard. Pulmonary/Chest: Effort normal and breath sounds normal. No respiratory distress. She has no wheezes. She has no rales. She exhibits no tenderness.  Abdominal: Soft. Bowel sounds are normal. She exhibits no distension and no mass. There is tenderness. There is no rebound and no guarding.  Lower abdominal tenderness to palpation  Genitourinary:  Chaperone present for exam:  Right labia is moderately swollen and erythematous, no induration, possibly runs posteriorly to the labia, concern for perineal abscess  Rectal Exam:  Soft brown stool, normal rectal tone, no gross blood per rectum  Musculoskeletal: Normal range of motion. She exhibits no edema and no tenderness.  No paraspinal muscles tender to palpation, no bony tenderness, step-offs, or gross abnormality or deformity of spine, patient is able to ambulate, moves all extremities  Bilateral great toe extension intact Bilateral plantar/dorsiflexion intact  Neurological: She is alert and oriented to person, place, and time. She has normal reflexes.  Distal sensation intact  Skin: Skin is warm and dry. She is not diaphoretic.  Psychiatric: She has a normal mood and affect. Her behavior is normal. Judgment and thought content normal.    ED Course  Procedures (including critical care time) Results for orders placed during the hospital encounter of 08/06/13  CBC WITH DIFFERENTIAL      Result Value Ref Range   WBC 6.5  4.0 - 10.5 K/uL   RBC 4.54  3.87 - 5.11 MIL/uL    Hemoglobin 13.2  12.0 - 15.0 g/dL   HCT 39.9  36.0 - 46.0 %   MCV 87.9  78.0 - 100.0 fL   MCH 29.1  26.0 - 34.0 pg   MCHC 33.1  30.0 - 36.0 g/dL   RDW 13.3  11.5 - 15.5 %   Platelets 159  150 - 400 K/uL   Neutrophils Relative % 76  43 - 77 %   Neutro Abs 4.9  1.7 - 7.7 K/uL   Lymphocytes Relative 15  12 - 46 %   Lymphs Abs 1.0  0.7 - 4.0 K/uL   Monocytes Relative 8  3 - 12 %   Monocytes Absolute 0.5  0.1 - 1.0 K/uL   Eosinophils Relative 1  0 - 5 %   Eosinophils Absolute 0.1  0.0 - 0.7 K/uL   Basophils Relative 0  0 - 1 %   Basophils Absolute 0.0  0.0 - 0.1 K/uL  BASIC METABOLIC PANEL      Result Value Ref Range   Sodium 140  137 - 147 mEq/L   Potassium 3.9  3.7 - 5.3 mEq/L   Chloride 102  96 - 112 mEq/L   CO2 28  19 - 32 mEq/L   Glucose, Bld 107 (*) 70 - 99 mg/dL   BUN 12  6 - 23 mg/dL   Creatinine, Ser 0.78  0.50 - 1.10 mg/dL   Calcium 9.1  8.4 - 10.5 mg/dL   GFR calc non Af Amer 79 (*) >90 mL/min   GFR calc Af Amer >90  >90 mL/min  URINALYSIS, ROUTINE W REFLEX MICROSCOPIC      Result Value Ref Range   Color, Urine YELLOW  YELLOW   APPearance CLOUDY (*) CLEAR   Specific Gravity, Urine 1.010  1.005 - 1.030   pH 6.5  5.0 - 8.0   Glucose, UA NEGATIVE  NEGATIVE mg/dL   Hgb urine dipstick NEGATIVE  NEGATIVE   Bilirubin Urine NEGATIVE  NEGATIVE   Ketones, ur NEGATIVE  NEGATIVE mg/dL   Protein, ur NEGATIVE  NEGATIVE mg/dL   Urobilinogen, UA 4.0 (*) 0.0 - 1.0 mg/dL   Nitrite NEGATIVE  NEGATIVE   Leukocytes, UA MODERATE (*) NEGATIVE  URINE MICROSCOPIC-ADD ON      Result Value Ref Range   Squamous Epithelial / LPF RARE  RARE   WBC, UA 7-10  <3 WBC/hpf   RBC / HPF 0-2  <3 RBC/hpf   Bacteria, UA RARE  RARE  POC OCCULT BLOOD, ED      Result Value Ref Range   Fecal Occult Bld NEGATIVE  NEGATIVE   Ct Abdomen Pelvis W Contrast  08/06/2013   CLINICAL DATA:  Diarrhea for 3 weeks. Intermittent rectal incontinence. Fluctuant mass or fluid collection right perineum. Question  abscess.  EXAM: CT ABDOMEN AND PELVIS WITH CONTRAST  TECHNIQUE: Multidetector CT imaging of the abdomen and pelvis was performed using the standard protocol following bolus administration of intravenous contrast.  CONTRAST:  100 mL OMNIPAQUE IOHEXOL 300 MG/ML  SOLN  COMPARISON:  CT abdomen and pelvis 04/29/2006.  FINDINGS: There is no pleural or pericardial effusion. Cardiomegaly is noted. Dependent atelectasis is seen right base.  There is a cystic lesion in the posterior and lateral aspect of the right labia measuring 3.8 x 2.5 x 2.6 cm. The lesion is most consistent with a Bartholin's gland cyst. It has a thick rim of enhancement suggestive of infection.  A large stone is seen in the gallbladder but there is no CT evidence of cholecystitis. Scattered low attenuating lesions in the liver likely representing cysts are unchanged. The spleen, adrenal glands, pancreas and kidneys are unremarkable.  There is a cystic lesion in the left adnexa which had measured 2.8 x 3.9 cm and today measures 6.7 x 4.2 cm. The right adnexa is unremarkable. The uterus has been removed. There is colonic diverticulosis, worst in the sigmoid, without diverticulitis. The colon is otherwise unremarkable. The stomach and small bowel appear normal.  Facet mediated anterolisthesis L4 on L5 is unchanged. No lytic or sclerotic bony lesion is identified.  IMPRESSION: Cystic lesion the right labia is most consistent with a Bartholin's gland cyst. Thick rim of enhancement is suggestive of superinfection.  Enlarging cystic lesion in the left adnexa since the comparison study is nonspecific. Nonemergent pelvic ultrasound after the patient's acute episode has passed is recommended.  Diverticulosis without diverticulitis.  Large gallstone without evidence of cholecystitis.  Electronically Signed   By: Inge Rise M.D.   On: 08/06/2013 19:29      EKG Interpretation None      MDM   Final diagnoses:  Bartholin gland cyst  Diarrhea     Patient with bowel incontinence.  Will check labs and CT to better evaluate abdominal tenderness. No evidence of cauda equina.  I suspect that the incontinence is likely associated to starting miralax.  She also has a history of IBD, and has been having pain with ambulation 2/2 the swelling of her labia.   7:44 PM CT reveal probable Bartholin gland cyst.  Investigated by myself and Dr. Jeneen Rinks.  Dr. Jeneen Rinks recommends outpatient treatment at [facility], as the depth seems to be beyond what could be simply managed in the ED at this time.  Plan to treat diarrhea with some abx and imodium per Dr. Jeneen Rinks' recommendations.    8:01 PM Discussed the patient with Dr. Elonda Husky from [facility], who recommends treatment with cipro/flagyl and follow-up in 4 days at [facility]  Discussed the plan with the patient and her family.  They understand and agree with the plan.  Plan to treat diarrhea with the same medications and imodium.  Patient is stable and ready for discharge.       Montine Circle, PA-C 08/06/13 2003

## 2013-08-06 NOTE — ED Notes (Signed)
Patient transported to CT 

## 2013-08-07 ENCOUNTER — Ambulatory Visit: Payer: Medicare Other | Admitting: Internal Medicine

## 2013-08-07 ENCOUNTER — Encounter: Payer: Self-pay | Admitting: Obstetrics & Gynecology

## 2013-08-07 ENCOUNTER — Ambulatory Visit (INDEPENDENT_AMBULATORY_CARE_PROVIDER_SITE_OTHER): Payer: Medicare Other | Admitting: Obstetrics & Gynecology

## 2013-08-07 VITALS — BP 129/78 | HR 63 | Temp 98.0°F | Wt 183.0 lb

## 2013-08-07 DIAGNOSIS — N751 Abscess of Bartholin's gland: Secondary | ICD-10-CM

## 2013-08-07 NOTE — Patient Instructions (Signed)
Bartholin's Cyst or Abscess Bartholin's glands are small glands located within the folds of skin (labia) along the sides of the lower opening of the vagina (birth canal). A cyst may develop when the duct of the gland becomes blocked. When this happens, fluid that accumulates within the cyst can become infected. This is known as an abscess. The Bartholin gland produces a mucous fluid to lubricate the outside of the vagina during sexual intercourse. SYMPTOMS   Patients with a small cyst may not have any symptoms.  Mild discomfort to severe pain depending on the size of the cyst and if it is infected (abscess).  Pain, redness, and swelling around the lower opening of the vagina.  Painful intercourse.  Pressure in the perineal area.  Swelling of the lips of the vagina (labia).  The cyst or abscess can be on one side or both sides of the vagina. DIAGNOSIS   A large swelling is seen in the lower vagina area by your caregiver.  Painful to touch.  Redness and pain, if it is an abscess. TREATMENT   Sometimes the cyst will go away on its own.  Apply warm wet compresses to the area or take hot sitz baths several times a day.  An incision to drain the cyst or abscess with local anesthesia.  Culture the pus, if it is an abscess.  Antibiotic treatment, if it is an abscess.  Cut open the gland and suture the edges to make the opening of the gland bigger (marsupialization).  Remove the whole gland if the cyst or abscess returns. PREVENTION   Practice good hygiene.  Clean the vaginal area with a mild soap and soft cloth when bathing.  Do not rub hard in the vaginal area when bathing.  Protect the crotch area with a padded cushion if you take long bike rides or ride horses.  Be sure you are well lubricated when you have sexual intercourse. HOME CARE INSTRUCTIONS   If your cyst or abscess was opened, a small piece of gauze, or a drain, may have been placed in the wound to allow  drainage. Do not remove this gauze or drain unless directed by your caregiver.  Wear feminine pads, not tampons, as needed for any drainage or bleeding.  If antibiotics were prescribed, take them exactly as directed. Finish the entire course.  Only take over-the-counter or prescription medicines for pain, discomfort, or fever as directed by your caregiver. SEEK IMMEDIATE MEDICAL CARE IF:   You have an increase in pain, redness, swelling, or drainage.  You have bleeding from the wound which results in the use of more than the number of pads suggested by your caregiver in 24 hours.  You have chills.  You have a fever.  You develop any new problems (symptoms) or aggravation of your existing condition. MAKE SURE YOU:   Understand these instructions.  Will watch your condition.  Will get help right away if you are not doing well or get worse. Document Released: 02/16/2005 Document Revised: 05/11/2011 Document Reviewed: 10/05/2007 [facility] Patient Information 2014 [place].

## 2013-08-07 NOTE — Progress Notes (Signed)
CLINIC ENCOUNTER NOTE  History:  79 y.o. [id number] here today for evaluation and management of a Bartholin's gland abscess noted during her ED visit on 08/06/13. Patient is accompanied by her husband and daughter. She has some hearing impairment but is able to understand when you speak loudly.  She reports noticing the vaginal swelling on 08/03/13; it has gotten bigger and more painful since.  No other associated GYN concerns.  The following portions of the patient's history were reviewed and updated as appropriate: allergies, current medications, past family history, past medical history, past social history, past surgical history and problem list.  Review of Systems:  Pertinent items are noted in HPI.  Objective:  Physical Exam BP 129/78  Pulse 63  Temp(Src) 98 F (36.7 C) (Oral)  Wt 183 lb (83.008 kg) Gen: NAD Pelvic: Erythematous and enlarged right labia, 3 cm Bartholin's abscess noted.  Bartholin Cyst I&D Enlarged abscess palpated in front of the hymenal ring around 7 o' clock.  Written informed consent was obtained.   Patient was examined in the dorsal lithotomy position and mass was identified.  The area was prepped with Iodine and draped in a sterile manner. 1% Lidocaine (3 ml) was then used to infiltrate area on top of the cyst, behind the hymenal ring.  A 7 mm incision was made using a sterile scapel. Upon palpation of the mass, a moderate amount of bloody purulent drainage was expressed through the incision. A hemostat was used to break up loculations, which resulted in expression of more bloody purulent drainage.  Recommended Sitz baths bid and analgesia as needed.  She is already on Ciprofloxacin and Metronidazole; will continue as instructed.   Word catheter was not placed due to patient's latex allergy.   She will follow up in one week.   Labs and Imaging 08/06/2013  CT ABDOMEN AND PELVIS WITH CONTRAST CLINICAL DATA:  Diarrhea for 3 weeks. Intermittent rectal incontinence.  Fluctuant mass or fluid collection right perineum. Question abscess.  TECHNIQUE: Multidetector CT imaging of the abdomen and pelvis was performed using the standard protocol following bolus administration of intravenous contrast.  CONTRAST:  100 mL OMNIPAQUE IOHEXOL 300 MG/ML  SOLN  COMPARISON:  CT abdomen and pelvis 04/29/2006.  FINDINGS: There is no pleural or pericardial effusion. Cardiomegaly is noted. Dependent atelectasis is seen right base.  There is a cystic lesion in the posterior and lateral aspect of the right labia measuring 3.8 x 2.5 x 2.6 cm. The lesion is most consistent with a Bartholin's gland cyst. It has a thick rim of enhancement suggestive of infection.  A large stone is seen in the gallbladder but there is no CT evidence of cholecystitis. Scattered low attenuating lesions in the liver likely representing cysts are unchanged. The spleen, adrenal glands, pancreas and kidneys are unremarkable.  There is a cystic lesion in the left adnexa which had measured 2.8 x 3.9 cm and today measures 6.7 x 4.2 cm. The right adnexa is unremarkable. The uterus has been removed. There is colonic diverticulosis, worst in the sigmoid, without diverticulitis. The colon is otherwise unremarkable. The stomach and small bowel appear normal.  Facet mediated anterolisthesis L4 on L5 is unchanged. No lytic or sclerotic bony lesion is identified.  IMPRESSION: Cystic lesion the right labia is most consistent with a Bartholin's gland cyst. Thick rim of enhancement is suggestive of superinfection.  Enlarging cystic lesion in the left adnexa since the comparison study is nonspecific. Nonemergent pelvic ultrasound after the patient's acute episode has  passed is recommended.  Diverticulosis without diverticulitis.  Large gallstone without evidence of cholecystitis.   Electronically Signed   By: Inge Rise M.D.   On: 08/06/2013 19:29    Assessment & Plan:  Follow up in one week; instructed to come to [facility]  for any  concerns Schedule nonemergent pelvic ultrasound to reevaluate left adnexal cystic lesion at next visit.    Verita Schneiders, MD, [place] Attending [place], [facility]

## 2013-08-15 NOTE — ED Provider Notes (Signed)
Medical screening examination/treatment/procedure(s) were conducted as a shared visit with non-physician practitioner(s) and myself.  I personally evaluated the patient during the encounter.   EKG Interpretation None      Patient seen and evaluated. Exam suggests a posterior labial, or very anterior perineal abscess. CT scan does show Bartholin's abscess.  History reviewed patient is symptomatic for several days. Has had some diarrhea. Is not frankly incontinent. Care was discussed with him. Debra [name][facility] They recommend antibiotics. He'll see the patient followup in the office for a scheduled outpatient incision and drainage. Think this is an appropriate plan. This plan was discussed between  myself and the patient and family. They agree with the disposition and plan as discussed.  Tanna Furry, MD 08/15/13 [phone]

## 2013-08-17 ENCOUNTER — Ambulatory Visit: Payer: Medicare Other

## 2013-08-17 ENCOUNTER — Ambulatory Visit: Payer: Medicare Other | Admitting: Obstetrics & Gynecology

## 2013-08-21 ENCOUNTER — Ambulatory Visit (HOSPITAL_BASED_OUTPATIENT_CLINIC_OR_DEPARTMENT_OTHER): Payer: Medicare Other

## 2013-08-21 VITALS — BP 145/67 | HR 58 | Temp 98.3°F

## 2013-08-21 DIAGNOSIS — E538 Deficiency of other specified B group vitamins: Secondary | ICD-10-CM

## 2013-08-21 MED ORDER — CYANOCOBALAMIN 1000 MCG/ML IJ SOLN
1000.0000 ug | Freq: Once | INTRAMUSCULAR | Status: AC
  Administered 2013-08-21: 1000 ug via INTRAMUSCULAR

## 2013-08-24 ENCOUNTER — Encounter: Payer: Self-pay | Admitting: Obstetrics & Gynecology

## 2013-08-24 ENCOUNTER — Ambulatory Visit (HOSPITAL_COMMUNITY)
Admission: RE | Admit: 2013-08-24 | Discharge: 2013-08-24 | Disposition: A | Discharge status: Home / Self Care | Payer: Medicare Other | Source: Ambulatory Visit | Attending: Obstetrics & Gynecology | Admitting: Obstetrics & Gynecology

## 2013-08-24 ENCOUNTER — Ambulatory Visit (INDEPENDENT_AMBULATORY_CARE_PROVIDER_SITE_OTHER): Payer: Medicare Other | Admitting: Obstetrics & Gynecology

## 2013-08-24 VITALS — BP 153/80 | HR 55 | Temp 97.6°F | Ht 62.5 in | Wt 186.2 lb

## 2013-08-24 DIAGNOSIS — Z1273 Encounter for screening for malignant neoplasm of ovary: Secondary | ICD-10-CM | POA: Diagnosis not present

## 2013-08-24 DIAGNOSIS — N839 Noninflammatory disorder of ovary, fallopian tube and broad ligament, unspecified: Secondary | ICD-10-CM | POA: Diagnosis not present

## 2013-08-24 DIAGNOSIS — N838 Other noninflammatory disorders of ovary, fallopian tube and broad ligament: Secondary | ICD-10-CM

## 2013-08-24 DIAGNOSIS — N83202 Unspecified ovarian cyst, left side: Secondary | ICD-10-CM

## 2013-08-24 DIAGNOSIS — Z90710 Acquired absence of both cervix and uterus: Secondary | ICD-10-CM | POA: Insufficient documentation

## 2013-08-24 DIAGNOSIS — N83292 Other ovarian cyst, left side: Secondary | ICD-10-CM

## 2013-08-24 DIAGNOSIS — N83209 Unspecified ovarian cyst, unspecified side: Secondary | ICD-10-CM | POA: Insufficient documentation

## 2013-08-24 DIAGNOSIS — R978 Other abnormal tumor markers: Secondary | ICD-10-CM

## 2013-08-24 NOTE — Patient Instructions (Signed)
Ovarian Cyst An ovarian cyst is a fluid-filled sac that forms on an ovary. The ovaries are small organs that produce eggs in women. Various types of cysts can form on the ovaries. Most are not cancerous. Many do not cause problems, and they often go away on their own. Some may cause symptoms and require treatment. Common types of ovarian cysts include:  Functional cysts--These cysts may occur every month during the menstrual cycle. This is normal. The cysts usually go away with the next menstrual cycle if the woman does not get pregnant. Usually, there are no symptoms with a functional cyst.  Endometrioma cysts--These cysts form from the tissue that lines the uterus. They are also called "chocolate cysts" because they become filled with blood that turns brown. This type of cyst can cause pain in the lower abdomen during intercourse and with your menstrual period.  Cystadenoma cysts--This type develops from the cells on the outside of the ovary. These cysts can get very big and cause lower abdomen pain and pain with intercourse. This type of cyst can twist on itself, cut off its blood supply, and cause severe pain. It can also easily rupture and cause a lot of pain.  Dermoid cysts--This type of cyst is sometimes found in both ovaries. These cysts may contain different kinds of body tissue, such as skin, teeth, hair, or cartilage. They usually do not cause symptoms unless they get very big.  Theca lutein cysts--These cysts occur when too much of a certain hormone (human chorionic gonadotropin) is produced and overstimulates the ovaries to produce an egg. This is most common after procedures used to assist with the conception of a baby (in vitro fertilization). CAUSES   Fertility drugs can cause a condition in which multiple large cysts are formed on the ovaries. This is called ovarian hyperstimulation syndrome.  A condition called polycystic ovary syndrome can cause hormonal imbalances that can lead to  nonfunctional ovarian cysts. SIGNS AND SYMPTOMS  Many ovarian cysts do not cause symptoms. If symptoms are present, they may include:  Pelvic pain or pressure.  Pain in the lower abdomen.  Pain during sexual intercourse.  Increasing girth (swelling) of the abdomen.  Abnormal menstrual periods.  Increasing pain with menstrual periods.  Stopping having menstrual periods without being pregnant. DIAGNOSIS  These cysts are commonly found during a routine or annual pelvic exam. Tests may be ordered to find out more about the cyst. These tests may include:  Ultrasound.  X-ray of the pelvis.  CT scan.  MRI.  Blood tests. TREATMENT  Many ovarian cysts go away on their own without treatment. Your health care provider may want to check your cyst regularly for 2-3 months to see if it changes. For women in menopause, it is particularly important to monitor a cyst closely because of the higher rate of ovarian cancer in menopausal women. When treatment is needed, it may include any of the following:  A procedure to drain the cyst (aspiration). This may be done using a long needle and ultrasound. It can also be done through a laparoscopic procedure. This involves using a thin, lighted tube with a tiny camera on the end (laparoscope) inserted through a small incision.  Surgery to remove the whole cyst. This may be done using laparoscopic surgery or an open surgery involving a larger incision in the lower abdomen.  Hormone treatment or birth control pills. These methods are sometimes used to help dissolve a cyst. HOME CARE INSTRUCTIONS   Only take over-the-counter   or prescription medicines as directed by your health care provider.  Follow up with your health care provider as directed.  Get regular pelvic exams and Pap tests. SEEK MEDICAL CARE IF:   Your periods are late, irregular, or painful, or they stop.  Your pelvic pain or abdominal pain does not go away.  Your abdomen becomes  larger or swollen.  You have pressure on your bladder or trouble emptying your bladder completely.  You have pain during sexual intercourse.  You have feelings of fullness, pressure, or discomfort in your stomach.  You lose weight for no apparent reason.  You feel generally ill.  You become constipated.  You lose your appetite.  You develop acne.  You have an increase in body and facial hair.  You are gaining weight, without changing your exercise and eating habits.  You think you are pregnant. SEEK IMMEDIATE MEDICAL CARE IF:   You have increasing abdominal pain.  You feel sick to your stomach (nauseous), and you throw up (vomit).  You develop a fever that comes on suddenly.  You have abdominal pain during a bowel movement.  Your menstrual periods become heavier than usual. MAKE SURE YOU:  Understand these instructions.  Will watch your condition.  Will get help right away if you are not doing well or get worse. Document Released: 02/16/2005 Document Revised: 02/21/2013 Document Reviewed: 10/24/2012 [employer] Patient Information 2015 [employer]. This information is not intended to replace advice given to you by your health care provider. Make sure you discuss any questions you have with your health care provider.  

## 2013-08-24 NOTE — Progress Notes (Signed)
Subjective:     Patient ID: Debra [name], female   DOB: [birth year], 79 y.o.   MRN: [id number]  HPI Pt here to f/u on Bartholin after drainage.  She was also noted to have a mass on Left adnexa noted on CT from ED.  She denies sx.  She and her daughter report that they were not aware of the adnexal mass.   Review of Systems     Objective:   Physical Exam BP 153/80  Pulse 55  Temp(Src) 97.6 F (36.4 C)  Ht 5' 2.5" (1.588 m)  Wt 186 lb 3.2 oz (84.46 kg)  BMI 33.49 kg/m2  GU: EGBUS: no lesions; righ labia well healed  08/06/2013 CLINICAL DATA: Diarrhea for 3 weeks. Intermittent rectal  incontinence. Fluctuant mass or fluid collection right perineum.  Question abscess.  EXAM:  CT ABDOMEN AND PELVIS WITH CONTRAST  TECHNIQUE:  Multidetector CT imaging of the abdomen and pelvis was performed  using the standard protocol following bolus administration of  intravenous contrast.  CONTRAST: 100 mL OMNIPAQUE IOHEXOL 300 MG/ML SOLN  COMPARISON: CT abdomen and pelvis 04/29/2006.  FINDINGS:  There is no pleural or pericardial effusion. Cardiomegaly is noted.  Dependent atelectasis is seen right base.  There is a cystic lesion in the posterior and lateral aspect of the  right labia measuring 3.8 x 2.5 x 2.6 cm. The lesion is most  consistent with a Bartholin's gland cyst. It has a thick rim of  enhancement suggestive of infection.  A large stone is seen in the gallbladder but there is no CT evidence  of cholecystitis. Scattered low attenuating lesions in the liver  likely representing cysts are unchanged. The spleen, adrenal glands,  pancreas and kidneys are unremarkable.  There is a cystic lesion in the left adnexa which had measured 2.8 x  3.9 cm and today measures 6.7 x 4.2 cm. The right adnexa is  unremarkable. The uterus has been removed. There is colonic  diverticulosis, worst in the sigmoid, without diverticulitis. The  colon is otherwise unremarkable. The stomach and small  bowel appear  normal.  Facet mediated anterolisthesis L4 on L5 is unchanged. No lytic or  sclerotic bony lesion is identified.  IMPRESSION:  Cystic lesion the right labia is most consistent with a Bartholin's  gland cyst. Thick rim of enhancement is suggestive of  superinfection.  Enlarging cystic lesion in the left adnexa since the comparison  study is nonspecific. Nonemergent pelvic ultrasound after the  patient's acute episode has passed is recommended.  Diverticulosis without diverticulitis.  Large gallstone without evidence of cholecystitis.        Assessment:     Left adnexal mass F/u Bartholins abcesss     Plan:     Pelvic sono to eval pelvic pain mass CA125 today F/u 2 months to review results Drs. Anyanwu or H-S

## 2013-08-25 LAB — CA 125: CA 125: 7.3 U/mL (ref 0.0–30.2)

## 2013-08-26 ENCOUNTER — Other Ambulatory Visit: Payer: Self-pay | Admitting: Family Medicine

## 2013-08-31 ENCOUNTER — Telehealth: Payer: Self-pay | Admitting: *Deleted

## 2013-08-31 DIAGNOSIS — N83209 Unspecified ovarian cyst, unspecified side: Secondary | ICD-10-CM

## 2013-08-31 NOTE — Telephone Encounter (Signed)
Message copied by Sue Lush on Thu Aug 31, 2013  2:06 PM ------      Message from: Lavonia Drafts      Created: Fri Aug 25, 2013 11:35 AM       Pls call pt (may need to speak to her daughter).  The CA125 is low and the cyst looks very benign on sono.  Rec f/u sono in 3 months.  If stable can recheck in 1 year.            Thx,      clh-S   ------

## 2013-08-31 NOTE — Telephone Encounter (Signed)
Some lady called on behalf of Debra [name] and stated she was returning our call

## 2013-08-31 NOTE — Telephone Encounter (Signed)
Attempted to call patient, no answer, left message for patient to contact office.

## 2013-09-04 NOTE — Telephone Encounter (Signed)
Ms. Debra [name] called back, I reviewed results with her and gave her appointment for next ultrasound. She voiced understanding.

## 2013-09-04 NOTE — Telephone Encounter (Addendum)
Debra [name] called and left another message she is supposed to get a call, that no one has returned her call- wants to know lab and [place] results to know what they are going to do. Made appointment for ultrasound for 11/24/13 3:45.  Called [name] and female answered- gave phone to [name] and call was disconnected accidentally by Ms. Schmierer.

## 2013-09-07 ENCOUNTER — Ambulatory Visit: Payer: Medicare Other | Admitting: Adult Health

## 2013-09-07 ENCOUNTER — Other Ambulatory Visit: Payer: Medicare Other | Admitting: Lab

## 2013-09-12 ENCOUNTER — Other Ambulatory Visit: Payer: Self-pay | Admitting: Family Medicine

## 2013-09-14 ENCOUNTER — Other Ambulatory Visit: Payer: Medicare Other

## 2013-09-14 ENCOUNTER — Ambulatory Visit: Payer: Medicare Other | Admitting: Adult Health

## 2013-09-14 ENCOUNTER — Ambulatory Visit: Payer: Medicare Other

## 2013-09-15 ENCOUNTER — Other Ambulatory Visit: Payer: Self-pay | Admitting: *Deleted

## 2013-09-15 DIAGNOSIS — D72819 Decreased white blood cell count, unspecified: Secondary | ICD-10-CM

## 2013-09-15 DIAGNOSIS — E538 Deficiency of other specified B group vitamins: Secondary | ICD-10-CM

## 2013-09-18 ENCOUNTER — Ambulatory Visit (HOSPITAL_BASED_OUTPATIENT_CLINIC_OR_DEPARTMENT_OTHER): Payer: Medicare Other

## 2013-09-18 ENCOUNTER — Telehealth: Payer: Self-pay | Admitting: Adult Health

## 2013-09-18 ENCOUNTER — Encounter: Payer: Self-pay | Admitting: Adult Health

## 2013-09-18 ENCOUNTER — Other Ambulatory Visit (HOSPITAL_BASED_OUTPATIENT_CLINIC_OR_DEPARTMENT_OTHER): Payer: Medicare Other

## 2013-09-18 ENCOUNTER — Ambulatory Visit (HOSPITAL_BASED_OUTPATIENT_CLINIC_OR_DEPARTMENT_OTHER): Payer: Medicare Other | Admitting: Adult Health

## 2013-09-18 VITALS — BP 168/96 | HR 64 | Temp 98.4°F | Resp 20 | Ht 62.5 in | Wt 183.8 lb

## 2013-09-18 DIAGNOSIS — D72819 Decreased white blood cell count, unspecified: Secondary | ICD-10-CM

## 2013-09-18 DIAGNOSIS — E538 Deficiency of other specified B group vitamins: Secondary | ICD-10-CM

## 2013-09-18 LAB — COMPREHENSIVE METABOLIC PANEL (CC13)
ALT: 11 U/L (ref 0–55)
ANION GAP: 7 meq/L (ref 3–11)
AST: 14 U/L (ref 5–34)
Albumin: 4 g/dL (ref 3.5–5.0)
Alkaline Phosphatase: 65 U/L (ref 40–150)
BILIRUBIN TOTAL: 0.71 mg/dL (ref 0.20–1.20)
BUN: 14.9 mg/dL (ref 7.0–26.0)
CO2: 28 meq/L (ref 22–29)
CREATININE: 0.9 mg/dL (ref 0.6–1.1)
Calcium: 9.5 mg/dL (ref 8.4–10.4)
Chloride: 108 mEq/L (ref 98–109)
GLUCOSE: 80 mg/dL (ref 70–140)
Potassium: 4.5 mEq/L (ref 3.5–5.1)
Sodium: 142 mEq/L (ref 136–145)
Total Protein: 7 g/dL (ref 6.4–8.3)

## 2013-09-18 LAB — CBC WITH DIFFERENTIAL/PLATELET
BASO%: 0.5 % (ref 0.0–2.0)
BASOS ABS: 0 10*3/uL (ref 0.0–0.1)
EOS%: 2.9 % (ref 0.0–7.0)
Eosinophils Absolute: 0.1 10*3/uL (ref 0.0–0.5)
HCT: 43 % (ref 34.8–46.6)
HGB: 14.3 g/dL (ref 11.6–15.9)
LYMPH%: 25.7 % (ref 14.0–49.7)
MCH: 29.2 pg (ref 25.1–34.0)
MCHC: 33.3 g/dL (ref 31.5–36.0)
MCV: 87.9 fL (ref 79.5–101.0)
MONO#: 0.3 10*3/uL (ref 0.1–0.9)
MONO%: 5.6 % (ref 0.0–14.0)
NEUT#: 2.9 10*3/uL (ref 1.5–6.5)
NEUT%: 65.3 % (ref 38.4–76.8)
PLATELETS: 160 10*3/uL (ref 145–400)
RBC: 4.89 10*6/uL (ref 3.70–5.45)
RDW: 13.8 % (ref 11.2–14.5)
WBC: 4.4 10*3/uL (ref 3.9–10.3)
lymph#: 1.1 10*3/uL (ref 0.9–3.3)

## 2013-09-18 MED ORDER — CYANOCOBALAMIN 1000 MCG/ML IJ SOLN
1000.0000 ug | Freq: Once | INTRAMUSCULAR | Status: AC
  Administered 2013-09-18: 1000 ug via INTRAMUSCULAR

## 2013-09-18 NOTE — Telephone Encounter (Signed)
per pof to sch pt appt-gave pt copy of sch °

## 2013-09-18 NOTE — Patient Instructions (Signed)
You are doing well.  Your lab work is normal.  You will return in 6 months and f/u with Dr. Alvy Bimler.  Please call us if you have any questions or concerns.

## 2013-09-18 NOTE — Progress Notes (Signed)
OFFICE PROGRESS NOTE  CC  Eliezer Lofts, MD [address] [place] [address]  DIAGNOSIS: 79 year old female with B12 deficiency and mild leukopenia  PRIOR THERAPY:  #1 leukopenia: Observation.  #2 B12 deficiency: Patient has been receiving monthly B12 injections at the [facility]. She no longer can receive those day or so she will now start coming to the [facility] for injections on a monthly basis.  CURRENT THERAPY:Vitamin B12 1000 mcg q. Monthly  INTERVAL HISTORY: Debra [name] 79 y.o. female returns for Followup visit today.  She is doing well.  She recent had a cyst on her ovary that ruptured and she did require intervention, antibiotics, and her most recent f/u was with Dr. Ihor Dow and everything is clearing up.  She has had some blood pressure difficulty, and her PCP is managing it.  She denies any fatigue, night sweats, weight loss, pain, or any further concerns.   MEDICAL HISTORY: Past Medical History  Diagnosis Date  . Pure hypercholesterolemia   . Degeneration of lumbar or lumbosacral intervertebral disc   . Lumbago   . Female stress incontinence   . Paroxysmal supraventricular tachycardia   . Unspecified glaucoma   . Diverticulosis of colon with hemorrhage     ALLERGIES:  is allergic to codeine phosphate; sulfa antibiotics; and latex.  MEDICATIONS:  Current Outpatient Prescriptions  Medication Sig Dispense Refill  . atenolol (TENORMIN) 50 MG tablet Take 1 tablet (50 mg total) by mouth daily.  30 tablet  3  . brimonidine (ALPHAGAN) 0.2 % ophthalmic solution Place 1 drop into both eyes 2 (two) times daily.      . cholecalciferol (VITAMIN D) 400 UNITS TABS tablet Take 400 Units by mouth daily.      . cloNIDine (CATAPRES) 0.1 MG tablet Take 1 tablet (0.1 mg total) by mouth 2 (two) times daily.  60 tablet  11  . hydrochlorothiazide (HYDRODIURIL) 25 MG tablet Take 1 tablet (25 mg total) by mouth daily.  30  tablet  11  . latanoprost (XALATAN) 0.005 % ophthalmic solution Place 1 drop into both eyes at bedtime.       Marland Kitchen lisinopril (PRINIVIL,ZESTRIL) 40 MG tablet TAKE 1 TABLET BY MOUTH DAILY  90 tablet  1  . loperamide (IMODIUM) 2 MG capsule Take 1 capsule (2 mg total) by mouth as needed for diarrhea or loose stools.  30 capsule  0  . pravastatin (PRAVACHOL) 80 MG tablet TAKE 1/2 TABLET BY MOUTH ONCE A DAY  90 tablet  1  . solifenacin (VESICARE) 5 MG tablet Take 10 mg by mouth daily.      . timolol (TIMOPTIC) 0.5 % ophthalmic solution Place 1 drop into both eyes 2 (two) times daily.       . verapamil (CALAN-SR) 240 MG CR tablet Take 240 mg by mouth daily.        No current facility-administered medications for this visit.    SURGICAL HISTORY:  Past Surgical History  Procedure Laterality Date  . Abdominal hysterectomy      REVIEW OF SYSTEMS:  A 10 point review of systems was conducted and is otherwise negative except for what is noted above.     PHYSICAL EXAMINATION: BP 168/96  Pulse 64  Temp(Src) 98.4 F (36.9 C) (Oral)  Resp 20  Ht 5' 2.5" (1.588 m)  Wt 183 lb 12.8 oz (83.371 kg)  BMI 33.06 kg/m2 GENERAL: Patient is a well appearing female in no acute distress  HEENT:  Sclerae anicteric.  Oropharynx clear and moist. No ulcerations or evidence of oropharyngeal candidiasis. Neck is supple.  NODES:  No cervical, supraclavicular, or axillary lymphadenopathy palpated.  LUNGS:  Clear to auscultation bilaterally.  No wheezes or rhonchi. HEART:  Regular rate and rhythm. No murmur appreciated. ABDOMEN:  Soft, nontender.  Positive, normoactive bowel sounds. No organomegaly palpated. MSK:  No focal spinal tenderness to palpation. Full range of motion bilaterally in the upper extremities. EXTREMITIES:  No peripheral edema.   SKIN:  Clear with no obvious rashes or skin changes. No nail dyscrasia. NEURO:  Nonfocal. Well oriented.  Appropriate affect. ECOG PERFORMANCE STATUS: 1 - Symptomatic but  completely ambulatory    LABORATORY DATA: Lab Results  Component Value Date   WBC 4.4 09/18/2013   HGB 14.3 09/18/2013   HCT 43.0 09/18/2013   MCV 87.9 09/18/2013   PLT 160 09/18/2013      Chemistry      Component Value Date/Time   NA 142 09/18/2013 1323   NA 140 08/06/2013 1602   NA 140 06/04/2009 1057   K 4.5 09/18/2013 1323   K 3.9 08/06/2013 1602   K 4.2 06/04/2009 1057   CL 102 08/06/2013 1602   CL 103 06/04/2009 1057   CO2 28 09/18/2013 1323   CO2 28 08/06/2013 1602   CO2 29 06/04/2009 1057   BUN 14.9 09/18/2013 1323   BUN 12 08/06/2013 1602   BUN 17 06/04/2009 1057   CREATININE 0.9 09/18/2013 1323   CREATININE 0.78 08/06/2013 1602   CREATININE 0.6 06/04/2009 1057      Component Value Date/Time   CALCIUM 9.5 09/18/2013 1323   CALCIUM 9.1 08/06/2013 1602   CALCIUM 9.7 06/04/2009 1057   ALKPHOS 65 09/18/2013 1323   ALKPHOS 60 07/07/2012 1027   ALKPHOS 101* 06/04/2009 1057   AST 14 09/18/2013 1323   AST 20 07/07/2012 1027   AST 36 06/04/2009 1057   ALT 11 09/18/2013 1323   ALT 13 07/07/2012 1027   ALT 54* 06/04/2009 1057   BILITOT 0.71 09/18/2013 1323   BILITOT 0.7 07/07/2012 1027   BILITOT 0.50 06/04/2009 1057       RADIOGRAPHIC STUDIES:  No results found.  ASSESSMENT: 79 year old female with B12 deficiency and mild leukopenia.   PLAN:   Debra [name] is doing well today.  She will proceed with a B12 injection today.  I reviewed her labs with her in detail.  She has no leukopenia and hasn't for some time.  They received the letter in the mail about Dr. Humphrey Rolls leaving the cancer center.  They would like to f/u with Dr. Alvy Bimler.  She will return in 6 months for labs, eval, and to establish care with Dr. Alvy Bimler.  I did renew the B12 injections and she will continue with these monthly.  All questions were answered. The patient knows to call the clinic with any problems, questions or concerns. We can certainly see the patient much sooner if necessary.  I spent 15 minutes counseling the patient face to face. The  total time spent in the appointment was 30 minutes.   Minette Headland, [place] [phone] 09/18/2013, 6:34 PM

## 2013-09-19 ENCOUNTER — Other Ambulatory Visit (INDEPENDENT_AMBULATORY_CARE_PROVIDER_SITE_OTHER): Payer: Medicare Other

## 2013-09-19 ENCOUNTER — Telehealth: Payer: Self-pay | Admitting: Family Medicine

## 2013-09-19 DIAGNOSIS — E7800 Pure hypercholesterolemia, unspecified: Secondary | ICD-10-CM

## 2013-09-19 DIAGNOSIS — E538 Deficiency of other specified B group vitamins: Secondary | ICD-10-CM

## 2013-09-19 DIAGNOSIS — E559 Vitamin D deficiency, unspecified: Secondary | ICD-10-CM

## 2013-09-19 DIAGNOSIS — D696 Thrombocytopenia, unspecified: Secondary | ICD-10-CM

## 2013-09-19 DIAGNOSIS — D72819 Decreased white blood cell count, unspecified: Secondary | ICD-10-CM

## 2013-09-19 LAB — LIPID PANEL
CHOLESTEROL: 207 mg/dL — AB (ref 0–200)
HDL: 38.4 mg/dL — AB (ref >39.00)
LDL Cholesterol: 135 mg/dL — ABNORMAL HIGH (ref 0–99)
NonHDL: 168.6
Total CHOL/HDL Ratio: 5
Triglycerides: 170 mg/dL — ABNORMAL HIGH (ref 0.0–149.0)
VLDL: 34 mg/dL (ref 0.0–40.0)

## 2013-09-19 LAB — VITAMIN D 25 HYDROXY (VIT D DEFICIENCY, FRACTURES): VITD: 29.91 ng/mL

## 2013-09-19 NOTE — Telephone Encounter (Signed)
Message copied by Jinny Sanders on Tue Sep 19, 2013  7:17 AM ------      Message from: Ellamae Sia      Created: Thu Sep 14, 2013  2:59 PM      Regarding: Lab orders for Tuesday, 7.21.15       Patient is scheduled for CPX labs, please order future labs, Thanks , Terri       ------

## 2013-09-22 ENCOUNTER — Other Ambulatory Visit: Payer: Self-pay | Admitting: Family Medicine

## 2013-09-22 ENCOUNTER — Encounter: Payer: Medicare Other | Admitting: Family Medicine

## 2013-09-25 ENCOUNTER — Telehealth: Payer: Self-pay | Admitting: General Practice

## 2013-09-25 NOTE — Telephone Encounter (Signed)
Patient's daughter called on her behalf stating they came here a few weeks ago for the cyst that was swelling and it had to be drained here in the office and things have been better. Over the weekend it started to swell again over the weekend and last night it bust open and started to drain and they weren't sure if she needed to come in or not. Told her if it's not causing problems or possible infection like it did last time it sounds like it's taking care of itself. Told her that if she has severe pain with it or they are concerned about infection to come to [facility] here at [facility] to have it looked at. Also recommended warm wash cloths to area and or sitz bath. She verbalized understanding to all and had no questions

## 2013-09-26 ENCOUNTER — Encounter: Payer: Medicare Other | Admitting: Family Medicine

## 2013-10-02 DIAGNOSIS — H409 Unspecified glaucoma: Secondary | ICD-10-CM | POA: Diagnosis not present

## 2013-10-02 DIAGNOSIS — H4011X0 Primary open-angle glaucoma, stage unspecified: Secondary | ICD-10-CM | POA: Diagnosis not present

## 2013-10-03 ENCOUNTER — Encounter: Payer: Self-pay | Admitting: *Deleted

## 2013-10-16 ENCOUNTER — Ambulatory Visit (HOSPITAL_BASED_OUTPATIENT_CLINIC_OR_DEPARTMENT_OTHER): Payer: Medicare Other

## 2013-10-16 VITALS — BP 182/81 | HR 61 | Temp 98.1°F

## 2013-10-16 DIAGNOSIS — E538 Deficiency of other specified B group vitamins: Secondary | ICD-10-CM

## 2013-10-16 MED ORDER — CYANOCOBALAMIN 1000 MCG/ML IJ SOLN
1000.0000 ug | Freq: Once | INTRAMUSCULAR | Status: AC
  Administered 2013-10-16: 1000 ug via INTRAMUSCULAR

## 2013-10-17 ENCOUNTER — Encounter: Payer: Self-pay | Admitting: Family Medicine

## 2013-10-17 ENCOUNTER — Ambulatory Visit (INDEPENDENT_AMBULATORY_CARE_PROVIDER_SITE_OTHER): Payer: Medicare Other | Admitting: Family Medicine

## 2013-10-17 VITALS — BP 148/76 | HR 72 | Temp 98.6°F | Resp 14 | Ht 62.0 in | Wt 184.0 lb

## 2013-10-17 DIAGNOSIS — E559 Vitamin D deficiency, unspecified: Secondary | ICD-10-CM

## 2013-10-17 DIAGNOSIS — E7800 Pure hypercholesterolemia, unspecified: Secondary | ICD-10-CM | POA: Diagnosis not present

## 2013-10-17 DIAGNOSIS — I10 Essential (primary) hypertension: Secondary | ICD-10-CM

## 2013-10-17 DIAGNOSIS — D320 Benign neoplasm of cerebral meninges: Secondary | ICD-10-CM

## 2013-10-17 DIAGNOSIS — M5137 Other intervertebral disc degeneration, lumbosacral region: Secondary | ICD-10-CM

## 2013-10-17 DIAGNOSIS — E538 Deficiency of other specified B group vitamins: Secondary | ICD-10-CM

## 2013-10-17 DIAGNOSIS — D329 Benign neoplasm of meninges, unspecified: Secondary | ICD-10-CM

## 2013-10-17 NOTE — Assessment & Plan Note (Signed)
She will continue her daily vitamin D regimen and she is able to tolerate we will go to 1000 international unit once a day

## 2013-10-17 NOTE — Assessment & Plan Note (Signed)
Cholesterol is uncontrolled with her risk factors I will increase her back to pravastatin 80 mg once a day

## 2013-10-17 NOTE — Assessment & Plan Note (Signed)
Obtain records from neurosurgery her meningioma has been stable she is due for an upcoming MRI

## 2013-10-17 NOTE — Patient Instructions (Addendum)
Increase clonidine to 0.1mg  in morning and 0.2mg  ( 2 tablets in the evening) Continue all the medication Pravastatin take 1 entire tablet  Release of records- Dr. Lysle Morales - Opthalmology Release of records- Dr. Deri Fuelling Neurosurgery F/U 4 weeks for blood pressure

## 2013-10-17 NOTE — Assessment & Plan Note (Signed)
Continue Tylenol arthritis

## 2013-10-17 NOTE — Assessment & Plan Note (Signed)
Blood pressure is uncontrolled with severe elevations in the past couple weeks. I will try her on clonidine 0.2 mg at bedtime she'll continue point 1 in the morning and we'll see how she does with home readings in one week.

## 2013-10-17 NOTE — Assessment & Plan Note (Signed)
Office will take over her monthly B12 injections

## 2013-10-17 NOTE — Progress Notes (Signed)
Patient ID: Debra [name], female   DOB: [birth year], 79 y.o.   MRN: [id number]   Subjective:    Patient ID: Debra [name], female    DOB: [birth year], 79 y.o.   MRN: [id number]  Patient presents for New Patient CPE  patient here to establish care. Previous PCP was Dr. Diona Browner, Dr. Trenton Gammon- Neurosurgery Dr. Henrene Pastor- GI,  Dr. Lysle Morales- Eye Doctor , Medications and history reviewed per the chart. She's history of hypertension which is very labile. She was actually in emergency room recently and she was given a B12 injection and her blood pressure was noted to be in the 333O systolic she's had this problem on and off. Her last visit her PCP in May she had a recent addition of clonidine 0.1 mg twice a day which is tolerated well. She does not miss her medications. She checks her blood pressure at home is typically in the 329 systolic over 19T to 66M diastolic. She's here today with her daughter due.  History of B12 deficiency she is on B12 injections is stable given by the cancer Center. She was also being followed for leukopenia however this has resolved over the past year.  Vitamin D deficiency status post ergocalciferol treatment she is now on daily vitamin D.  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  Hyperlipidemia she's currently on pravastatin 40 mg which she's been on for the past couple of years she did have some recent fasting labs which she's not had the results of.  She did complain of hip pain and leg pain which is worse this week she does have known degenerative disease of the spine this week she's been more active and cleaning around the home by her daughter nothing out of the ordinary. She's not had any falls. She does take Tylenol which helps  Review Of Systems:  GEN- denies fatigue, fever, weight loss,weakness, recent illness HEENT- denies eye  drainage, change in vision, nasal discharge, CVS- denies chest pain, palpitations RESP- denies SOB, cough, wheeze ABD- denies N/V, change in stools, abd pain GU- denies dysuria, hematuria, dribbling, incontinence MSK- + joint pain, muscle aches, injury Neuro- denies headache, dizziness, syncope, seizure activity       Objective:    BP 148/76  Pulse 72  Temp(Src) 98.6 F (37 C) (Oral)  Resp 14  Ht 5\' 2"  (1.575 m)  Wt 184 lb (83.462 kg)  BMI 33.65 kg/m2 GEN- NAD, alert and oriented x3, repeat 138/96, hard of hearing HEENT- PERRL, EOMI, non injected sclera, pink conjunctiva, MMM, oropharynx clear Neck- Supple, no bruit  CVS- RRR, no murmur RESP-CTAB ABD-NABS,soft,NT,ND MSK- Spine NT, fair ROM, bilat hips fair ROM EXT- No edema Pulses- Radial, DP- 2+        Assessment & Plan:      Problem List Items Addressed This Visit   None      Note: This dictation was prepared with Dragon dictation along with smaller phrase technology. Any transcriptional errors that result from this process are unintentional.

## 2013-10-20 ENCOUNTER — Encounter: Payer: Self-pay | Admitting: General Practice

## 2013-10-26 ENCOUNTER — Encounter: Payer: Medicare Other | Admitting: Family Medicine

## 2013-11-01 ENCOUNTER — Telehealth: Payer: Self-pay | Admitting: Family Medicine

## 2013-11-01 MED ORDER — CLONIDINE HCL 0.1 MG PO TABS: ORAL_TABLET | ORAL | Status: DC

## 2013-11-01 NOTE — Telephone Encounter (Signed)
Message copied by Alycia Rossetti on Wed Nov 01, 2013  4:29 PM ------      Message from: [place], [place]: Wed Nov 01, 2013  4:15 PM      Regarding: RE: f/u BP readings       Call placed to patient.             Reports that she is taking her BO 1 hour after taking her medications.             Her BP readings are as follows:      139/79, 67HR      134/88, 55HR      143/86, 61HR      136/82, 56HR      133/70, 61HR      139/86, 58HR      126/80, 60HR      140/81, 65HR      138/78, 57HR.            MD to be made aware.       ----- Message -----         From: Alycia Rossetti, MD         Sent: 10/31/2013   1:53 PM           To: Eden Lathe Six, LPN      Subject: FW: f/u BP readings                                      Call pt daughter see how her blood pressure have been running on teh clonidine 0.2mg  at bedtime.                    ----- Message -----         From: Alycia Rossetti, MD         Sent: 10/23/2013           To: Alycia Rossetti, MD      Subject: f/u BP readings                                                       ------

## 2013-11-01 NOTE — Telephone Encounter (Signed)
BP look good, no further changes to medications

## 2013-11-13 ENCOUNTER — Ambulatory Visit: Payer: Medicare Other

## 2013-11-14 ENCOUNTER — Ambulatory Visit (INDEPENDENT_AMBULATORY_CARE_PROVIDER_SITE_OTHER): Payer: Medicare Other | Admitting: Family Medicine

## 2013-11-14 ENCOUNTER — Encounter: Payer: Self-pay | Admitting: Family Medicine

## 2013-11-14 VITALS — BP 160/84 | HR 72 | Temp 98.7°F | Resp 14 | Ht 62.0 in | Wt 185.0 lb

## 2013-11-14 DIAGNOSIS — I10 Essential (primary) hypertension: Secondary | ICD-10-CM | POA: Diagnosis not present

## 2013-11-14 DIAGNOSIS — L259 Unspecified contact dermatitis, unspecified cause: Secondary | ICD-10-CM | POA: Diagnosis not present

## 2013-11-14 DIAGNOSIS — Z23 Encounter for immunization: Secondary | ICD-10-CM | POA: Diagnosis not present

## 2013-11-14 DIAGNOSIS — E538 Deficiency of other specified B group vitamins: Secondary | ICD-10-CM

## 2013-11-14 MED ORDER — CYANOCOBALAMIN 1000 MCG/ML IJ SOLN
1000.0000 ug | Freq: Once | INTRAMUSCULAR | Status: AC
  Administered 2013-11-14: 1000 ug via INTRAMUSCULAR

## 2013-11-14 MED ORDER — CLONIDINE HCL 0.1 MG PO TABS: ORAL_TABLET | ORAL | Status: DC

## 2013-11-14 MED ORDER — PRAVASTATIN SODIUM 80 MG PO TABS: ORAL_TABLET | ORAL | Status: DC

## 2013-11-14 MED ORDER — TRIAMCINOLONE ACETONIDE 0.1 % EX CREA: 1.0000 "application " | TOPICAL_CREAM | Freq: Two times a day (BID) | CUTANEOUS | Status: DC

## 2013-11-14 NOTE — Assessment & Plan Note (Signed)
On review of her chart her blood pressure seems to be very difficult to control. She is very labile blood pressure. I'm going to try splitting her medication to see if his numbers are better throughout the day I will have her take a half a tablet lisinopril the a.m. and in the p.m. also move her diltiazem to the evening she will continue the other medications in the morning. I will follow with her blood pressure via phone in one week. Her goal is to keep her systolic at least less than 150

## 2013-11-14 NOTE — Patient Instructions (Addendum)
Move the diltazem to bedtime for blood pressure Continue clonidine 1 in AM and 2 in the evening Take lisinopril take 1/2 tablet in morning and evening  B12 shot given Flu shot given We will call for blood pressures in 1 week  F/U 3 months

## 2013-11-14 NOTE — Assessment & Plan Note (Signed)
Have given her triamcinolone cream to apply twice a day

## 2013-11-14 NOTE — Progress Notes (Signed)
Patient ID: Debra [name], female   DOB: [birth year], 79 y.o.   MRN: [id number]   Subjective:    Patient ID: Debra [name], female    DOB: 01-May-1934, 79 y.o.   MRN: [id number]  Patient presents for 4 week F/U and Vitamin B12 Shot  patient here for interim followup on her blood pressure. Her daughter states her blood pressure been running good for the past week or so she states when she woke up in the morning her eyes were a little bloodshot therefore she took her blood pressure they have ranged from 180-187/97 100 her pulse is been around 60 she is taking clonidine 2 tablets at bedtime one in the morning she's also taking diltiazem and lisinopril and atenolol in the morning. She denies any chest pain shortness of breath or headache associated  She also noted a mild rash on her legs she was out in the yard gardening she was using topical over the counter cream which has helped but she has 2 new spots on her left arm a little itchy in nature.  Review Of Systems:  GEN- denies fatigue, fever, weight loss,weakness, recent illness HEENT- denies eye drainage, change in vision, nasal discharge, CVS- denies chest pain, palpitations RESP- denies SOB, cough, wheeze ABD- denies N/V, change in stools, abd pain GU- denies dysuria, hematuria, dribbling, incontinence MSK- denies joint pain, muscle aches, injury Neuro- denies headache, dizziness, syncope, seizure activity       Objective:    BP 160/84  Pulse 72  Temp(Src) 98.7 F (37.1 C) (Oral)  Resp 14  Ht 5\' 2"  (1.575 m)  Wt 185 lb (83.915 kg)  BMI 33.83 kg/m2 GEN- NAD, alert and oriented x3 HEENT- PERRL, EOMI, non injected sclera, pink conjunctiva, MMM, oropharynx clear CVS- RRR, no murmur RESP-CTAB Skin- 2 erythematous maculopapular lesions on left deltoid, few erythematous tiny macules on right lower leg EXT- No edema Pulses- Radial 2+        Assessment & Plan:      Problem List Items Addressed This Visit   VITAMIN B12  DEFICIENCY - Primary   Relevant Medications      cyanocobalamin ((VITAMIN B-12)) injection 1,000 mcg (Completed)    Other Visit Diagnoses   Need for prophylactic vaccination and inoculation against influenza        Relevant Orders       Flu Vaccine QUAD 36+ mos PF IM (Fluarix Quad PF) (Completed)       Note: This dictation was prepared with Dragon dictation along with smaller phrase technology. Any transcriptional errors that result from this process are unintentional.

## 2013-11-20 ENCOUNTER — Telehealth: Payer: Self-pay | Admitting: *Deleted

## 2013-11-20 NOTE — Telephone Encounter (Signed)
Call placed to patient. LMTRC.  

## 2013-11-20 NOTE — Telephone Encounter (Signed)
Patient returned call.   BP readings are as follows:                         Before meds               After meds  11/16/2013 169/89   134/81 11/17/2013 197/93   135/74 11/18/2013 181/96   143/91 11/19/2013 143/93   141/85 11/20/2013 142/85   141/90  MD to be made aware.

## 2013-11-20 NOTE — Telephone Encounter (Signed)
Message copied by Sheral Flow on Mon Nov 20, 2013  2:44 PM ------      Message from: Vic Blackbird F      Created: Mon Nov 20, 2013  1:36 PM      Regarding: FW: f/u BP with med changes       Call pt and see how BP is running with the changes in medications      - put in chart        ----- Message -----         From: Alycia Rossetti, MD         Sent: 11/20/2013           To: Alycia Rossetti, MD      Subject: f/u BP with med changes                                         ------

## 2013-11-20 NOTE — Telephone Encounter (Signed)
Blood pressure look good, continue same medication

## 2013-11-24 ENCOUNTER — Ambulatory Visit (HOSPITAL_COMMUNITY)
Admission: RE | Admit: 2013-11-24 | Discharge: 2013-11-24 | Disposition: A | Discharge status: Home / Self Care | Payer: Medicare Other | Source: Ambulatory Visit | Attending: Obstetrics & Gynecology | Admitting: Obstetrics & Gynecology

## 2013-11-24 DIAGNOSIS — N83209 Unspecified ovarian cyst, unspecified side: Secondary | ICD-10-CM | POA: Diagnosis not present

## 2013-11-27 ENCOUNTER — Telehealth: Payer: Self-pay

## 2013-11-27 NOTE — Telephone Encounter (Signed)
Message sent to admin pool to call patient with scheduled appointment. Called patient and informed her of results and that our front office staff will be calling her with an appointment. Patient verbalized understanding. No questions or concerns.

## 2013-11-27 NOTE — Telephone Encounter (Signed)
Message copied by Geanie Logan on Mon Nov 27, 2013  8:32 AM ------      Message from: Verita Schneiders A      Created: Fri Nov 24, 2013  4:48 PM      Regarding: Please  schedule surgical consult with [facility] soon (and call patient with results)       I was called by [facility] regarding ultrasound results showing enlarging left adnexal cyst for this patient; see below.            Patient needs appointment soon to discuss further surgical management.  Please schedule with Dr. Ihor Dow.            Please call to inform patient of results and recommendations.      Thank you!            Laray Anger                        11/24/2013 TRANSVAGINAL ULTRASOUND OF PELVIS      CLINICAL DATA:  Followup left ovarian cyst      TECHNIQUE:      Transvaginal ultrasound examination of the pelvis was performed      including evaluation of the uterus, ovaries, adnexal regions, and      pelvic cul-de-sac.      COMPARISON:  08/24/2013 ultrasound, CT abdomen/pelvis 04/29/2006      FINDINGS:      Uterus Measurements: Surgically absent. No midline mass.      Endometrium Thickness: Surgically absent.      Right ovary Measurements: Surgically absent.      Left ovary Measurements: Cyst with linear low level internal echoes measuring 6.6 x 5.9 x 3.6 cm.       Other findings:  No free fluid      IMPRESSION: Interval increase in size of left ovarian/adnexal cyst. Given the interval increase in size, consider surgical resection preceded by preoperative evaluation with pelvic MRI with contrast for better definition of the internal contents of this left adnexal/ ovarian cyst.  These results will be called to the ordering clinician or      representative by the Radiologist Assistant, and communication      documented in the PACS or zVision Dashboard.                    Electronically Signed        By: Conchita Paris M.D.        On: 11/24/2013 16:24       ------

## 2013-12-07 ENCOUNTER — Encounter: Payer: Self-pay | Admitting: Obstetrics & Gynecology

## 2013-12-07 ENCOUNTER — Ambulatory Visit (INDEPENDENT_AMBULATORY_CARE_PROVIDER_SITE_OTHER): Payer: Medicare Other | Admitting: Obstetrics & Gynecology

## 2013-12-07 DIAGNOSIS — N839 Noninflammatory disorder of ovary, fallopian tube and broad ligament, unspecified: Secondary | ICD-10-CM | POA: Diagnosis not present

## 2013-12-07 DIAGNOSIS — N838 Other noninflammatory disorders of ovary, fallopian tube and broad ligament: Secondary | ICD-10-CM

## 2013-12-07 NOTE — Patient Instructions (Signed)
Unilateral Salpingo-Oophorectomy Unilateral salpingo-oophorectomy is the surgical removal of one fallopian tube and ovary. The ovaries are small organs that produce eggs in women. The fallopian tubes transport the egg from the ovary to the womb (uterus). A unilateral salpingo-oophorectomy may be done for various reasons, including:  Infection in the fallopian tube and ovary.  Scar tissue in the fallopian tube and ovary (adhesions).  A cyst or tumor on the ovary.  A need to remove the fallopian tube and ovary when removing the uterus.  Cancer of the fallopian tube or ovary. The removal of one fallopian tube and ovary will not prevent you from becoming pregnant, put you into menopause, or cause problems with your menstrual periods or sex drive. LET YOUR HEALTH CARE PROVIDER KNOW ABOUT:  Any allergies you have.  All medicines you are taking, including vitamins, herbs, eye drops, creams, and over-the-counter medicines.  Previous problems you or members of your family have had with the use of anesthetics.  Any blood disorders you have.  Previous surgeries you have had.  Medical conditions you have. RISKS AND COMPLICATIONS  Generally, this is a safe procedure. However, as with any procedure, complications can occur. Possible complications include:  Injury to surrounding organs.  Bleeding.  Infection.  Blood clots in the legs or lungs.  Problems related to anesthesia. BEFORE THE PROCEDURE  Ask your health care provider about changing or stopping your regular medicines. You may need to stop taking certain medicines, such as aspirin or blood thinners, at least 1 week before the surgery.  Do not eat or drink anything for at least 8 hours before the surgery.  If you smoke, do not smoke for at least 2 weeks before the surgery.  Make plans to have someone drive you home after the procedure or after your hospital stay. Also arrange for someone to help you with activities during  recovery. PROCEDURE  You will be given medicine to help you relax before the procedure (sedative). You will then be given medicine to make you sleep through the procedure (general anesthetic). These medicines will be given through an IV access tube that is put into one of your veins.  Once you are asleep, your lower abdomen will be shaved and cleaned. A thin, flexible tube (catheter) will be placed in your bladder.  The surgeon may use a laparoscopic, robotic, or open technique for this surgery:  In the laparoscopic technique, the surgery is done through two small cuts (incisions) in the abdomen. A thin, lighted tube with a tiny camera on the end (laparoscope) is inserted into one of the incisions. The tools needed for the procedure are put through the other incision.  A robotic technique may be chosen to perform complex surgery in a small space. In the robotic technique, small incisions are made. A camera and surgical instruments are passed through the incisions. Surgical instruments are controlled with the help of a robotic arm.  In the open technique, the surgery is done through one large incision in the abdomen.  Using any of these techniques, the surgeon will remove the fallopian tube and ovary. The blood vessels will be clamped and tied.  The surgeon will then use staples or stitches to close the incision or incisions. AFTER THE PROCEDURE  You will be taken to a recovery area where your progress will be monitored for 1-3 hours. Your blood pressure, pulse, and temperature will be checked often. You will remain in the recovery area until you are stable and waking up.    If the laparoscopic technique was used, you may be allowed to go home after several hours. You may have some shoulder pain. This is normal and usually goes away in a day or two.  If the open technique was used, you will be admitted to the hospital for a couple of days.  You will be given pain medicine as necessary.  The  IV tube and catheter will be removed before you are discharged. Document Released: 12/14/2008 Document Revised: 02/21/2013 Document Reviewed: 08/10/2012 ExitCare Patient Information 2015 [employer]. This information is not intended to replace advice given to you by your health care provider. Make sure you discuss any questions you have with your health care provider.  

## 2013-12-07 NOTE — Patient Instructions (Addendum)
  Your procedure is scheduled on:  Tuesday, Oct 13  Enter through the [employer] [facility] at: 145 PM Pick up the phone at the desk and dial [phone] and inform us of your arrival.  Please call this number if you have any problems the morning of surgery: [phone]  Remember: Do not eat food after midnight: Monday Do not drink clear liquids after: 11 AM Tuesday, day of surgery. Take these medicines the morning of surgery with a SIP OF WATER: atenolol, clonidine, lisinopril, pravastatin, verapamil.  Ok to use eye drops on day of surgery.  Do not wear jewelry, make-up, or FINGER nail polish No metal in your hair or on your body. Do not wear lotions, powders, perfumes.  You may wear deodorant.  Do not bring valuables to the hospital. Contacts, dentures or bridgework may not be worn into surgery.  Leave suitcase in the car. After Surgery it may be brought to your room. For patients being admitted to the hospital, checkout time is 11:00am the day of discharge.   Home with daughter Juliann Pulse.

## 2013-12-07 NOTE — Progress Notes (Signed)
Patient ID: Debra [name], female   DOB: 1935-01-22, 79 y.o.   MRN: [id number] Pt presents with her daughter for discussion of enlarging ovarian cyst.  Pt reports that she does occ have pain on her left side.  She denies weight loss or increased abdominal girth.  No fever or chills and no anorexia.  08/24/2013 CA125  7.3 08/24/2013 TECHNIQUE:  Both transabdominal and transvaginal ultrasound examinations of the  pelvis were performed. Transabdominal technique was performed for  global imaging of the pelvis including uterus, ovaries, adnexal  regions, and pelvic cul-de-sac. It was necessary to proceed with  endovaginal exam following the transabdominal exam to visualize the  left adnexa.  COMPARISON: CT 08/06/2013  FINDINGS:  Uterus  Measurements: Not applicable.  Endometrium  Thickness: Not applicable.  Right ovary  Measurements: Not applicable.  Left ovary  Measurements: 6.4 x 3.8 x 3.4 cm cyst is identified within the left  ovary. No complicating features are identified. Specifically no  septation or nodularity identified. No normal ovarian tissue  identified.  Other findings  No free fluid.  IMPRESSION:  1. Left ovarian cyst. This is almost certainly benign, but follow up  ultrasound is recommended in 1 year according to the Society of  Radiologists in [place] Statement (D  Clovis Riley et al. Management of Asymptomatic Ovarian and Other Adnexal  Cysts Imaged at [place]: Society of Radiologists in [place] Statement 2010. Radiology 256 (Sept 2010): 595-638.).   11/24/2013 TECHNIQUE:  Transvaginal ultrasound examination of the pelvis was performed  including evaluation of the uterus, ovaries, adnexal regions, and  pelvic cul-de-sac.  COMPARISON: 08/24/2013 ultrasound, CT abdomen/pelvis 04/29/2006  FINDINGS:  Uterus  Measurements: Surgically absent. No midline mass.  Endometrium  Thickness: Surgically absent.  Right ovary   Measurements: Surgically absent.  Left ovary  Measurements: Cyst with linear low level internal echoes measuring  6.6 x 5.9 x 3.6 cm. Normal appearance/no adnexal mass.  Other findings: No free fluid  IMPRESSION:  Interval increase in size of left ovarian/adnexal cyst. Given the  interval increase in size, consider surgical resection preceded by  preoperative evaluation with pelvic MRI with contrast for better  definition of the internal contents of this left adnexal/ ovarian  cyst.  These results will be called to the ordering clinician or  representative by the Radiologist Assistant, and communication  documented in the PACS or zVision Dashboard.    A) discussed with pt and her daughter  Slight increase in ovarian cyst.  Given that pt is post menopausal, I would not anticipate any change in the cyst size.  Discussed management options and pt and her daughter would like to have the cyst and the ovary removed.  P) Patient desires surgical management with left oophorectomy via laparotomy.  The risks of surgery were discussed in detail with the patient including but not limited to: bleeding which may require transfusion or reoperation; infection which may require prolonged hospitalization or re-hospitalization and antibiotic therapy; injury to bowel, bladder, ureters and major vessels or other surrounding organs; need for additional procedures including laparotomy; thromboembolic phenomenon, incisional problems and other postoperative or anesthesia complications.  Patient was told that the likelihood that her condition and symptoms will be treated effectively with this surgical management was very high; the postoperative expectations were also discussed in detail. The patient also understands the alternative treatment options which were discussed in full. All questions were answered.  She was told her surgery will be on October 13th at 3:15pm but, that  she will need to arrive at 1:30pm. Routine  preoperative instructions of having nothing to eat or drink after midnight on the day prior to surgery and also coming to the hospital 1 1/2 hours prior to her time of surgery were emphasized.  She was told she will be called for a preoperative appointment about a week prior to surgery and will be given further preoperative instructions at that visit. Printed patient education handouts about the procedure were given to the patient to review at home.

## 2013-12-11 ENCOUNTER — Encounter (HOSPITAL_COMMUNITY): Payer: Self-pay

## 2013-12-11 ENCOUNTER — Ambulatory Visit: Payer: Medicare Other

## 2013-12-11 ENCOUNTER — Encounter (HOSPITAL_COMMUNITY)
Admission: RE | Admit: 2013-12-11 | Discharge: 2013-12-11 | Disposition: A | Discharge status: Home / Self Care | Payer: Medicare Other | Source: Ambulatory Visit | Attending: Obstetrics & Gynecology | Admitting: Obstetrics & Gynecology

## 2013-12-11 ENCOUNTER — Telehealth: Payer: Self-pay | Admitting: *Deleted

## 2013-12-11 DIAGNOSIS — K5731 Diverticulosis of large intestine without perforation or abscess with bleeding: Secondary | ICD-10-CM | POA: Insufficient documentation

## 2013-12-11 DIAGNOSIS — E780 Pure hypercholesterolemia: Secondary | ICD-10-CM | POA: Insufficient documentation

## 2013-12-11 DIAGNOSIS — M545 Low back pain: Secondary | ICD-10-CM | POA: Diagnosis not present

## 2013-12-11 DIAGNOSIS — Z8711 Personal history of peptic ulcer disease: Secondary | ICD-10-CM | POA: Insufficient documentation

## 2013-12-11 DIAGNOSIS — H409 Unspecified glaucoma: Secondary | ICD-10-CM | POA: Insufficient documentation

## 2013-12-11 DIAGNOSIS — N393 Stress incontinence (female) (male): Secondary | ICD-10-CM | POA: Diagnosis not present

## 2013-12-11 DIAGNOSIS — Z0000 Encounter for general adult medical examination without abnormal findings: Secondary | ICD-10-CM | POA: Insufficient documentation

## 2013-12-11 DIAGNOSIS — Z85820 Personal history of malignant melanoma of skin: Secondary | ICD-10-CM | POA: Insufficient documentation

## 2013-12-11 DIAGNOSIS — I471 Supraventricular tachycardia: Secondary | ICD-10-CM | POA: Diagnosis not present

## 2013-12-11 DIAGNOSIS — D329 Benign neoplasm of meninges, unspecified: Secondary | ICD-10-CM | POA: Insufficient documentation

## 2013-12-11 DIAGNOSIS — G8929 Other chronic pain: Secondary | ICD-10-CM | POA: Diagnosis not present

## 2013-12-11 DIAGNOSIS — R9431 Abnormal electrocardiogram [ECG] [EKG]: Secondary | ICD-10-CM

## 2013-12-11 DIAGNOSIS — E538 Deficiency of other specified B group vitamins: Secondary | ICD-10-CM | POA: Insufficient documentation

## 2013-12-11 DIAGNOSIS — M949 Disorder of cartilage, unspecified: Secondary | ICD-10-CM | POA: Insufficient documentation

## 2013-12-11 DIAGNOSIS — M5137 Other intervertebral disc degeneration, lumbosacral region: Secondary | ICD-10-CM | POA: Insufficient documentation

## 2013-12-11 DIAGNOSIS — E559 Vitamin D deficiency, unspecified: Secondary | ICD-10-CM | POA: Diagnosis not present

## 2013-12-11 DIAGNOSIS — Z8719 Personal history of other diseases of the digestive system: Secondary | ICD-10-CM | POA: Insufficient documentation

## 2013-12-11 DIAGNOSIS — I10 Essential (primary) hypertension: Secondary | ICD-10-CM | POA: Insufficient documentation

## 2013-12-11 DIAGNOSIS — D72818 Other decreased white blood cell count: Secondary | ICD-10-CM | POA: Insufficient documentation

## 2013-12-11 DIAGNOSIS — L259 Unspecified contact dermatitis, unspecified cause: Secondary | ICD-10-CM | POA: Insufficient documentation

## 2013-12-11 DIAGNOSIS — Z01810 Encounter for preprocedural cardiovascular examination: Secondary | ICD-10-CM

## 2013-12-11 HISTORY — DX: Missed abortion: O02.1

## 2013-12-11 HISTORY — DX: Malignant (primary) neoplasm, unspecified: C80.1

## 2013-12-11 HISTORY — DX: Cardiac arrhythmia, unspecified: I49.9

## 2013-12-11 HISTORY — DX: Headache, unspecified: R51.9

## 2013-12-11 HISTORY — DX: Diarrhea, unspecified: R19.7

## 2013-12-11 HISTORY — DX: Headache: R51

## 2013-12-11 HISTORY — DX: Other specified postprocedural states: R11.2

## 2013-12-11 HISTORY — DX: Constipation, unspecified: K59.00

## 2013-12-11 HISTORY — DX: Other specified disorders of brain: G93.89

## 2013-12-11 HISTORY — DX: Other specified postprocedural states: Z98.890

## 2013-12-11 LAB — CBC
HEMATOCRIT: 41.9 % (ref 36.0–46.0)
Hemoglobin: 14.2 g/dL (ref 12.0–15.0)
MCH: 29.8 pg (ref 26.0–34.0)
MCHC: 33.9 g/dL (ref 30.0–36.0)
MCV: 87.8 fL (ref 78.0–100.0)
Platelets: 179 10*3/uL (ref 150–400)
RBC: 4.77 MIL/uL (ref 3.87–5.11)
RDW: 14.3 % (ref 11.5–15.5)
WBC: 4.1 10*3/uL (ref 4.0–10.5)

## 2013-12-11 LAB — BASIC METABOLIC PANEL
Anion gap: 10 (ref 5–15)
BUN: 15 mg/dL (ref 6–23)
CHLORIDE: 105 meq/L (ref 96–112)
CO2: 27 meq/L (ref 19–32)
CREATININE: 0.89 mg/dL (ref 0.50–1.10)
Calcium: 8.9 mg/dL (ref 8.4–10.5)
GFR calc Af Amer: 71 mL/min — ABNORMAL LOW (ref >90)
GFR calc non Af Amer: 61 mL/min — ABNORMAL LOW (ref >90)
GLUCOSE: 93 mg/dL (ref 70–99)
Potassium: 4.7 mEq/L (ref 3.7–5.3)
Sodium: 142 mEq/L (ref 137–147)

## 2013-12-11 NOTE — Telephone Encounter (Signed)
Called and left message with Dr. Jillyn Hidden.

## 2013-12-11 NOTE — Pre-Procedure Instructions (Signed)
Surgery was cancelled by Dr. Jillyn Hidden.  Needs Cardiac Clearance (stress test).  Patient's BP at PAT appointment was 184/91 and rechecked 45 minutes later 178/82.  Patient's PCP Dr Vic [facility]bird at [facility] was informed Gabriel Cirri, RN) via phone.  Patient was informed that surgery tomorrow was cancelled but surgery could be rescheduled after she gets cardiac clearance. Patient instructed to call Dr. Buelah Manis this afternoon to obtain additional information/appointment for cardiac clearance.

## 2013-12-11 NOTE — Telephone Encounter (Signed)
Please place referral for cardiology- Dx- HTN, abnormal EKG, cardiac clearance Let pt know she can not have surgery due to abnormal EKG and blood pressure, she will need to see heart doctor first to make sure heart is strong enough for surgery

## 2013-12-11 NOTE — Telephone Encounter (Signed)
Received a call from [place] at [facility] stating that pt is suppose to come in tomorrow for surgery and stated that pts EKG and BP were abnormal, and Dr. Jillyn Hidden feels like it not safe  Before he can do the surgery she needs a cardiac clearance and further work up. Please call Dr. Jillyn Hidden at [phone].

## 2013-12-12 ENCOUNTER — Telehealth: Payer: Self-pay

## 2013-12-12 ENCOUNTER — Inpatient Hospital Stay (HOSPITAL_COMMUNITY)
Admission: RE | Admit: 2013-12-12 | Payer: Medicare Other | Source: Ambulatory Visit | Admitting: Obstetrics & Gynecology

## 2013-12-12 ENCOUNTER — Encounter (HOSPITAL_COMMUNITY): Admission: RE | Payer: Self-pay | Source: Ambulatory Visit

## 2013-12-12 SURGERY — LAPAROTOMY, EXPLORATORY
Anesthesia: Choice

## 2013-12-12 NOTE — Telephone Encounter (Signed)
Message copied by Geanie Logan on Tue Dec 12, 2013  4:06 PM ------      Message from: Lavonia Drafts      Created: Tue Dec 12, 2013  2:29 PM       Please call pt.  Her OR case was cancelled today due to elevated BP. Make sure that she is scheduled with her primary care and referred to Cardiology in order to get her rescheduled for surgery.            Thx,      clh-S ------

## 2013-12-12 NOTE — Telephone Encounter (Signed)
Called patient and she stated she has PCP appointment this Thursday and cardiology appointment scheduled for November 9th at 1115. Informed her it will be important for her to attend both appointments so that surgery can be rescheduled. Patient verbalized understanding and gratitude. No questions or concerns.

## 2013-12-12 NOTE — Telephone Encounter (Signed)
Referral placed for Cardiology and pt aware can not have surgery d/t HTN and abnormal EKG

## 2013-12-14 ENCOUNTER — Encounter: Payer: Self-pay | Admitting: Interventional Cardiology

## 2013-12-14 ENCOUNTER — Ambulatory Visit (INDEPENDENT_AMBULATORY_CARE_PROVIDER_SITE_OTHER): Payer: Medicare Other | Admitting: Interventional Cardiology

## 2013-12-14 ENCOUNTER — Ambulatory Visit (INDEPENDENT_AMBULATORY_CARE_PROVIDER_SITE_OTHER): Payer: Medicare Other | Admitting: *Deleted

## 2013-12-14 VITALS — BP 192/100 | HR 58 | Ht 62.5 in | Wt 182.8 lb

## 2013-12-14 DIAGNOSIS — I10 Essential (primary) hypertension: Secondary | ICD-10-CM

## 2013-12-14 DIAGNOSIS — E538 Deficiency of other specified B group vitamins: Secondary | ICD-10-CM

## 2013-12-14 DIAGNOSIS — R9431 Abnormal electrocardiogram [ECG] [EKG]: Secondary | ICD-10-CM | POA: Diagnosis not present

## 2013-12-14 DIAGNOSIS — Z01810 Encounter for preprocedural cardiovascular examination: Secondary | ICD-10-CM

## 2013-12-14 DIAGNOSIS — R011 Cardiac murmur, unspecified: Secondary | ICD-10-CM | POA: Diagnosis not present

## 2013-12-14 MED ORDER — CYANOCOBALAMIN 1000 MCG/ML IJ SOLN
1000.0000 ug | Freq: Once | INTRAMUSCULAR | Status: AC
  Administered 2013-12-14: 1000 ug via INTRAMUSCULAR

## 2013-12-14 MED ORDER — AMLODIPINE BESYLATE 10 MG PO TABS: 10.0000 mg | ORAL_TABLET | Freq: Every day | ORAL | Status: DC

## 2013-12-14 NOTE — Progress Notes (Signed)
Patient ID: Debra [name], female   DOB: 31-Jul-1934, 79 y.o.   MRN: [id number]     Patient ID: Stela Iwasaki MRN: [id number] DOB/AGE: Jul 17, 1934 79 y.o.   Referring Physician Dr. Buelah Manis   Reason for Consultation preoperative clearance  HPI: 79 y/o who has difficult to control HTN.  She was supposed to have surgery but this was postponed due to an abnormal ECG.  Poor R wave progression noted.  She had some SHOB prior to starting B12, but this resolved after B12 injections, which started 3-4 years ago.  Most strenuous activity is gardening, walking up stairs, walking uphill, yard work.    Stress test many years ago which was normal (2001).    BP often in the 782 systolic range at home.   Current Outpatient Prescriptions  Medication Sig Dispense Refill  . atenolol (TENORMIN) 50 MG tablet TAKE 1 TABLET (50 MG TOTAL) BY MOUTH DAILY.  30 tablet  5  . brimonidine (ALPHAGAN) 0.2 % ophthalmic solution Place 1 drop into both eyes 2 (two) times daily.      . cholecalciferol (VITAMIN D) 400 UNITS TABS tablet Take 400 Units by mouth daily.      . cloNIDine (CATAPRES) 0.1 MG tablet Take 1 tablet in AM and 2 tablets at bedtime  270 tablet  1  . cyanocobalamin (,VITAMIN B-12,) 1000 MCG/ML injection Inject 1,000 mcg into the muscle every 30 (thirty) days.      Marland Kitchen latanoprost (XALATAN) 0.005 % ophthalmic solution Place 1 drop into both eyes at bedtime.       Marland Kitchen lisinopril (PRINIVIL,ZESTRIL) 40 MG tablet Take 40 mg by mouth daily. Takes one half in the morning and one half at night      . loperamide (IMODIUM) 2 MG capsule Take 1 capsule (2 mg total) by mouth as needed for diarrhea or loose stools.  30 capsule  0  . pravastatin (PRAVACHOL) 80 MG tablet TAKE 1 TABLET BY MOUTH ONCE A DAY  90 tablet  2  . timolol (TIMOPTIC) 0.5 % ophthalmic solution Place 1 drop into both eyes 2 (two) times daily.       Marland Kitchen triamcinolone cream (KENALOG) 0.1 % Apply 1 application topically 2 (two) times daily.  30 g  0  .  verapamil (CALAN-SR) 240 MG CR tablet TAKE 1 TABLET BY MOUTH morning       No current facility-administered medications for this visit.   Past Medical History  Diagnosis Date  . Pure hypercholesterolemia   . Lumbago   . Female stress incontinence   . Paroxysmal supraventricular tachycardia     Hx  . Unspecified glaucoma     bilateral  . Diverticulosis of colon with hemorrhage   . Hypertension   . SVD (spontaneous vaginal delivery)     x 4  . Missed abortion     x 2 - no surgery required  . PONV (postoperative nausea and vomiting)   . Dysrhythmia     Hx irregular heart beat - 15 yrs ago  . Constipation   . Diarrhea   . Headache     otc med prn  . Degeneration of lumbar or lumbosacral intervertebral disc     arthritis in back, hips and hands - otc med prn  . Cancer     hand, face - spots removed  . Brain mass     monitor by Dr Trenton Gammon - right posterior fossa meningioma - thinks it's benign per patient    Family History  Problem  Relation Age of Onset  . Aneurysm Mother   . Alzheimer's disease Mother   . Cancer Father     prostate  . Cancer Sister   . Arthritis Sister   . Heart disease Brother   . Hypertension Brother   . Glaucoma Brother     History   Social History  . Marital Status: Married    Spouse Name: N/A    Number of Children: N/A  . Years of Education: N/A   Occupational History  . raised tobacco    Social History Main Topics  . Smoking status: Never Smoker   . Smokeless tobacco: Never Used  . Alcohol Use: No  . Drug Use: No  . Sexual Activity: Not Currently    Birth Control/ Protection: None, Post-menopausal, Surgical   Other Topics Concern  . Not on file   Social History Narrative    Has living will.  Daughter is HCPOA, Stephanie Acre.   Full code.    Past Surgical History  Procedure Laterality Date  . Abdominal hysterectomy    . Appendectomy    . Eye surgery      bilateral cataract eye surgery  . Colonoscopy    . Multiple tooth  extractions      upper  . Tubal ligation    . Eye surgery      laser to relieve eye pressure - bilateral  . Right hand surgery      ganglion cyst removed      (Not in a hospital admission)  Review of systems complete and found to be negative unless listed above .  No nausea, vomiting.  No fever chills, No focal weakness,  No palpitations.  Physical Exam: Filed Vitals:   12/14/13 0903  BP: 192/100  Pulse: 58    Weight: 182 lb 12.8 oz (82.918 kg)  Physical exam:  [place]/AT EOMI No JVD, No carotid bruit RRR O1B5  2/6 systolic murmur No wheezing Soft. NT, nondistended No edema. No focal motor or sensory deficits Normal affect  Labs:   Lab Results  Component Value Date   WBC 4.1 12/11/2013   HGB 14.2 12/11/2013   HCT 41.9 12/11/2013   MCV 87.8 12/11/2013   PLT 179 12/11/2013    Recent Labs Lab 12/11/13 0945  NA 142  K 4.7  CL 105  CO2 27  BUN 15  CREATININE 0.89  CALCIUM 8.9  GLUCOSE 93   No results found for this basename: CKTOTAL, CKMB, CKMBINDEX, TROPONINI    Lab Results  Component Value Date   CHOL 207* 09/19/2013   CHOL 189 07/07/2012   CHOL 177 03/18/2011   Lab Results  Component Value Date   HDL 38.40* 09/19/2013   HDL 41.10 07/07/2012   HDL 43.80 03/18/2011   Lab Results  Component Value Date   LDLCALC 135* 09/19/2013   LDLCALC 120* 07/07/2012   LDLCALC 98 03/18/2011   Lab Results  Component Value Date   TRIG 170.0* 09/19/2013   TRIG 141.0 07/07/2012   TRIG 174.0* 03/18/2011   Lab Results  Component Value Date   CHOLHDL 5 09/19/2013   CHOLHDL 5 07/07/2012   CHOLHDL 4 03/18/2011   Lab Results  Component Value Date   LDLDIRECT 160.7 09/24/2009   LDLDIRECT 156.7 05/07/2006       EKG:NSR, PRWP  ASSESSMENT AND PLAN:  Preoperative eval/Abnl ECG:  Good exercise capacity wthout sx of ischemia while walking up hills.  Check echo to eval LV function.  Pursue ischemia evaluation if  there is evidence of LV dysfunction.    HTN: Stop verapamil. Start  amlodipne 10 mg dialy.  Cannot raise rate slowing drugs due to low HR.  Could consider spironolactone as well for BP lowering. Signed:  Murmur: Check echo. ? Some degree of aortic stenosis.  Mina Marble, MD, [facility] 12/14/2013, 9:24 AM

## 2013-12-14 NOTE — Progress Notes (Signed)
Patient ID: Debra [name], female   DOB: Mar 01, 1935, 79 y.o.   MRN: [id number] Patient seen in office for Vitamin B12 administration.  Tolerated IM administration well.

## 2013-12-14 NOTE — Patient Instructions (Signed)
Your physician has recommended you make the following change in your medication:   1. Stop Verapamil.  2. Start Amlodipine 10 mg 1 tablet daily.   Your physician has requested that you have an echocardiogram. Echocardiography is a painless test that uses sound waves to create images of your heart. It provides your doctor with information about the size and shape of your heart and how well your heart's chambers and valves are working. This procedure takes approximately one hour. There are no restrictions for this procedure.

## 2013-12-18 ENCOUNTER — Ambulatory Visit (HOSPITAL_COMMUNITY): Payer: Medicare Other | Attending: Cardiology | Admitting: Radiology

## 2013-12-18 DIAGNOSIS — I10 Essential (primary) hypertension: Secondary | ICD-10-CM | POA: Diagnosis not present

## 2013-12-18 DIAGNOSIS — R011 Cardiac murmur, unspecified: Secondary | ICD-10-CM | POA: Diagnosis not present

## 2013-12-18 DIAGNOSIS — E669 Obesity, unspecified: Secondary | ICD-10-CM | POA: Insufficient documentation

## 2013-12-18 DIAGNOSIS — R9431 Abnormal electrocardiogram [ECG] [EKG]: Secondary | ICD-10-CM | POA: Diagnosis not present

## 2013-12-18 DIAGNOSIS — E785 Hyperlipidemia, unspecified: Secondary | ICD-10-CM | POA: Insufficient documentation

## 2013-12-18 NOTE — Progress Notes (Signed)
Echocardiogram performed.  

## 2013-12-27 ENCOUNTER — Ambulatory Visit: Payer: Medicare Other | Admitting: Obstetrics & Gynecology

## 2014-01-01 ENCOUNTER — Encounter: Payer: Self-pay | Admitting: Interventional Cardiology

## 2014-01-04 ENCOUNTER — Ambulatory Visit: Payer: Medicare Other | Admitting: Obstetrics & Gynecology

## 2014-01-08 ENCOUNTER — Institutional Professional Consult (permissible substitution): Payer: Medicare Other | Admitting: Cardiology

## 2014-01-08 ENCOUNTER — Ambulatory Visit: Payer: Medicare Other

## 2014-02-01 ENCOUNTER — Encounter (HOSPITAL_COMMUNITY)
Admission: RE | Admit: 2014-02-01 | Discharge: 2014-02-01 | Disposition: A | Discharge status: Home / Self Care | Payer: Medicare Other | Source: Ambulatory Visit | Attending: Obstetrics & Gynecology | Admitting: Obstetrics & Gynecology

## 2014-02-01 ENCOUNTER — Encounter (HOSPITAL_COMMUNITY): Payer: Self-pay

## 2014-02-01 DIAGNOSIS — N8320 Unspecified ovarian cysts: Secondary | ICD-10-CM | POA: Insufficient documentation

## 2014-02-01 DIAGNOSIS — Z01812 Encounter for preprocedural laboratory examination: Secondary | ICD-10-CM | POA: Diagnosis not present

## 2014-02-01 LAB — CBC
HEMATOCRIT: 41.4 % (ref 36.0–46.0)
HEMOGLOBIN: 13.9 g/dL (ref 12.0–15.0)
MCH: 29.6 pg (ref 26.0–34.0)
MCHC: 33.6 g/dL (ref 30.0–36.0)
MCV: 88.3 fL (ref 78.0–100.0)
Platelets: 169 10*3/uL (ref 150–400)
RBC: 4.69 MIL/uL (ref 3.87–5.11)
RDW: 13.5 % (ref 11.5–15.5)
WBC: 3.7 10*3/uL — ABNORMAL LOW (ref 4.0–10.5)

## 2014-02-01 LAB — BLOOD PRODUCT ORDER (VERBAL) VERIFICATION

## 2014-02-01 LAB — BASIC METABOLIC PANEL
ANION GAP: 10 (ref 5–15)
BUN: 13 mg/dL (ref 6–23)
CHLORIDE: 103 meq/L (ref 96–112)
CO2: 27 mEq/L (ref 19–32)
CREATININE: 0.77 mg/dL (ref 0.50–1.10)
Calcium: 9.4 mg/dL (ref 8.4–10.5)
GFR calc Af Amer: >90 mL/min (ref >90)
GFR, EST NON AFRICAN AMERICAN: 79 mL/min — AB (ref >90)
Glucose, Bld: 92 mg/dL (ref 70–99)
Potassium: 4.4 mEq/L (ref 3.7–5.3)
Sodium: 140 mEq/L (ref 137–147)

## 2014-02-01 NOTE — Patient Instructions (Addendum)
   Your procedure is scheduled on: DEC 8 AT 1230  Enter through the Main Entrance of [facility] at: [place] up the phone at the desk and dial [phone] and inform us of your arrival.  Please call this number if you have any problems the morning of surgery: [phone]  Remember: Do not eat food after midnight:DEC 7 Do not drink clear liquids after: DEC 8 AT 830AM Take these medicines the morning of surgery with a SIP OF WATER: TAKE MEDICINES AS NORMAL  Do not wear jewelry, make-up, or FINGER nail polish No metal in your hair or on your body. Do not wear lotions, powders, perfumes.  You may wear deodorant.  Do not bring valuables to the hospital. Contacts, dentures or bridgework may not be worn into surgery.  Leave suitcase in the car. After Surgery it may be brought to your room. For patients being admitted to the hospital, checkout time is 11:00am the day of discharge.    Patients discharged on the day of surgery will not be allowed to drive home.

## 2014-02-05 ENCOUNTER — Ambulatory Visit: Payer: Medicare Other

## 2014-02-06 ENCOUNTER — Encounter (HOSPITAL_COMMUNITY): Payer: Self-pay | Admitting: *Deleted

## 2014-02-06 ENCOUNTER — Inpatient Hospital Stay (HOSPITAL_COMMUNITY): Payer: Medicare Other | Admitting: Certified Registered Nurse Anesthetist

## 2014-02-06 ENCOUNTER — Inpatient Hospital Stay (HOSPITAL_COMMUNITY)
Admission: RE | Admit: 2014-02-06 | Discharge: 2014-02-08 | DRG: 749 | Disposition: A | Discharge status: Home / Self Care | Payer: Medicare Other | Source: Ambulatory Visit | Attending: Obstetrics & Gynecology | Admitting: Obstetrics & Gynecology

## 2014-02-06 ENCOUNTER — Encounter (HOSPITAL_COMMUNITY)
Admission: RE | Disposition: A | Discharge status: Home / Self Care | Payer: Self-pay | Source: Ambulatory Visit | Attending: Obstetrics & Gynecology

## 2014-02-06 DIAGNOSIS — N393 Stress incontinence (female) (male): Secondary | ICD-10-CM | POA: Diagnosis present

## 2014-02-06 DIAGNOSIS — M5136 Other intervertebral disc degeneration, lumbar region: Secondary | ICD-10-CM | POA: Diagnosis present

## 2014-02-06 DIAGNOSIS — N736 Female pelvic peritoneal adhesions (postinfective): Secondary | ICD-10-CM | POA: Diagnosis present

## 2014-02-06 DIAGNOSIS — I10 Essential (primary) hypertension: Secondary | ICD-10-CM | POA: Diagnosis present

## 2014-02-06 DIAGNOSIS — Z91040 Latex allergy status: Secondary | ICD-10-CM | POA: Diagnosis not present

## 2014-02-06 DIAGNOSIS — H409 Unspecified glaucoma: Secondary | ICD-10-CM | POA: Diagnosis present

## 2014-02-06 DIAGNOSIS — Z9889 Other specified postprocedural states: Secondary | ICD-10-CM

## 2014-02-06 DIAGNOSIS — I471 Supraventricular tachycardia: Secondary | ICD-10-CM | POA: Diagnosis present

## 2014-02-06 DIAGNOSIS — N8320 Unspecified ovarian cysts: Principal | ICD-10-CM | POA: Diagnosis present

## 2014-02-06 DIAGNOSIS — R102 Pelvic and perineal pain: Secondary | ICD-10-CM

## 2014-02-06 DIAGNOSIS — Z885 Allergy status to narcotic agent status: Secondary | ICD-10-CM

## 2014-02-06 DIAGNOSIS — M1990 Unspecified osteoarthritis, unspecified site: Secondary | ICD-10-CM | POA: Diagnosis present

## 2014-02-06 DIAGNOSIS — E780 Pure hypercholesterolemia: Secondary | ICD-10-CM | POA: Diagnosis present

## 2014-02-06 DIAGNOSIS — Z9851 Tubal ligation status: Secondary | ICD-10-CM | POA: Diagnosis not present

## 2014-02-06 DIAGNOSIS — K5731 Diverticulosis of large intestine without perforation or abscess with bleeding: Secondary | ICD-10-CM | POA: Diagnosis present

## 2014-02-06 DIAGNOSIS — Z882 Allergy status to sulfonamides status: Secondary | ICD-10-CM | POA: Diagnosis not present

## 2014-02-06 HISTORY — PX: LAPAROTOMY: SHX154

## 2014-02-06 SURGERY — LAPAROTOMY, EXPLORATORY
Anesthesia: General | Site: Abdomen

## 2014-02-06 MED ORDER — CEFAZOLIN SODIUM-DEXTROSE 2-3 GM-% IV SOLR: 2.0000 g | INTRAVENOUS | Status: DC

## 2014-02-06 MED ORDER — ONDANSETRON HCL 4 MG/2ML IJ SOLN
INTRAMUSCULAR | Status: AC
  Filled 2014-02-06: qty 2

## 2014-02-06 MED ORDER — CEFAZOLIN SODIUM-DEXTROSE 2-3 GM-% IV SOLR
2.0000 g | INTRAVENOUS | Status: AC
  Administered 2014-02-06: 2 g via INTRAVENOUS

## 2014-02-06 MED ORDER — DEXAMETHASONE SODIUM PHOSPHATE 10 MG/ML IJ SOLN
INTRAMUSCULAR | Status: AC
  Filled 2014-02-06: qty 1

## 2014-02-06 MED ORDER — FENTANYL CITRATE 0.05 MG/ML IJ SOLN
INTRAMUSCULAR | Status: AC
  Filled 2014-02-06: qty 5

## 2014-02-06 MED ORDER — NEOSTIGMINE METHYLSULFATE 10 MG/10ML IV SOLN
INTRAVENOUS | Status: AC
  Filled 2014-02-06: qty 1

## 2014-02-06 MED ORDER — IBUPROFEN 800 MG PO TABS
800.0000 mg | ORAL_TABLET | Freq: Three times a day (TID) | ORAL | Status: DC | PRN
  Administered 2014-02-07 – 2014-02-08 (×3): 800 mg via ORAL
  Filled 2014-02-06 (×3): qty 1

## 2014-02-06 MED ORDER — FENTANYL CITRATE 0.05 MG/ML IJ SOLN: 25.0000 ug | INTRAMUSCULAR | Status: DC | PRN

## 2014-02-06 MED ORDER — PROPOFOL 10 MG/ML IV EMUL
INTRAVENOUS | Status: AC
  Filled 2014-02-06: qty 20

## 2014-02-06 MED ORDER — NALOXONE HCL 0.4 MG/ML IJ SOLN: 0.4000 mg | INTRAMUSCULAR | Status: DC | PRN

## 2014-02-06 MED ORDER — ROCURONIUM BROMIDE 100 MG/10ML IV SOLN
INTRAVENOUS | Status: AC
  Filled 2014-02-06: qty 1

## 2014-02-06 MED ORDER — OXYCODONE-ACETAMINOPHEN 5-325 MG PO TABS
1.0000 | ORAL_TABLET | ORAL | Status: DC | PRN
  Administered 2014-02-07: 1 via ORAL
  Filled 2014-02-06: qty 1

## 2014-02-06 MED ORDER — MORPHINE SULFATE (PF) 1 MG/ML IV SOLN
INTRAVENOUS | Status: DC
  Administered 2014-02-06: 11 mL via INTRAVENOUS
  Administered 2014-02-06: 15:00:00 via INTRAVENOUS
  Administered 2014-02-06: 21 mg via INTRAVENOUS
  Filled 2014-02-06: qty 25

## 2014-02-06 MED ORDER — TIMOLOL MALEATE 0.5 % OP SOLN
1.0000 [drp] | Freq: Two times a day (BID) | OPHTHALMIC | Status: DC
  Administered 2014-02-07 – 2014-02-08 (×3): 1 [drp] via OPHTHALMIC
  Filled 2014-02-06: qty 5

## 2014-02-06 MED ORDER — CEFAZOLIN SODIUM-DEXTROSE 2-3 GM-% IV SOLR
INTRAVENOUS | Status: AC
  Filled 2014-02-06: qty 50

## 2014-02-06 MED ORDER — BRIMONIDINE TARTRATE 0.2 % OP SOLN
1.0000 [drp] | Freq: Two times a day (BID) | OPHTHALMIC | Status: DC
  Administered 2014-02-07 – 2014-02-08 (×3): 1 [drp] via OPHTHALMIC

## 2014-02-06 MED ORDER — SODIUM CHLORIDE 0.9 % IJ SOLN: 9.0000 mL | INTRAMUSCULAR | Status: DC | PRN

## 2014-02-06 MED ORDER — DIPHENHYDRAMINE HCL 50 MG/ML IJ SOLN: 12.5000 mg | Freq: Four times a day (QID) | INTRAMUSCULAR | Status: DC | PRN

## 2014-02-06 MED ORDER — LISINOPRIL 20 MG PO TABS
20.0000 mg | ORAL_TABLET | Freq: Two times a day (BID) | ORAL | Status: DC
  Administered 2014-02-07 (×2): 20 mg via ORAL
  Filled 2014-02-06 (×3): qty 1

## 2014-02-06 MED ORDER — ONDANSETRON HCL 4 MG/2ML IJ SOLN
INTRAMUSCULAR | Status: DC | PRN
  Administered 2014-02-06: 4 mg via INTRAVENOUS

## 2014-02-06 MED ORDER — CLONIDINE HCL 0.2 MG PO TABS
0.2000 mg | ORAL_TABLET | Freq: Every day | ORAL | Status: DC
  Administered 2014-02-06 – 2014-02-07 (×2): 0.2 mg via ORAL
  Filled 2014-02-06 (×2): qty 1

## 2014-02-06 MED ORDER — GLYCOPYRROLATE 0.2 MG/ML IJ SOLN
INTRAMUSCULAR | Status: AC
  Filled 2014-02-06: qty 3

## 2014-02-06 MED ORDER — PROPOFOL 10 MG/ML IV BOLUS
INTRAVENOUS | Status: DC | PRN
  Administered 2014-02-06: 150 mg via INTRAVENOUS

## 2014-02-06 MED ORDER — SCOPOLAMINE 1 MG/3DAYS TD PT72: 1.0000 | MEDICATED_PATCH | Freq: Once | TRANSDERMAL | Status: DC

## 2014-02-06 MED ORDER — MEPERIDINE HCL 25 MG/ML IJ SOLN: 6.2500 mg | INTRAMUSCULAR | Status: DC | PRN

## 2014-02-06 MED ORDER — NEOSTIGMINE METHYLSULFATE 10 MG/10ML IV SOLN
INTRAVENOUS | Status: DC | PRN
  Administered 2014-02-06: 3 mg via INTRAVENOUS

## 2014-02-06 MED ORDER — FENTANYL CITRATE 0.05 MG/ML IJ SOLN
INTRAMUSCULAR | Status: DC | PRN
  Administered 2014-02-06 (×5): 50 ug via INTRAVENOUS

## 2014-02-06 MED ORDER — ONDANSETRON HCL 4 MG/2ML IJ SOLN: 4.0000 mg | Freq: Four times a day (QID) | INTRAMUSCULAR | Status: DC | PRN

## 2014-02-06 MED ORDER — LACTATED RINGERS IV SOLN: INTRAVENOUS | Status: DC

## 2014-02-06 MED ORDER — BISACODYL 10 MG RE SUPP: 10.0000 mg | Freq: Every day | RECTAL | Status: DC | PRN

## 2014-02-06 MED ORDER — DIPHENHYDRAMINE HCL 12.5 MG/5ML PO ELIX: 12.5000 mg | ORAL_SOLUTION | Freq: Four times a day (QID) | ORAL | Status: DC | PRN

## 2014-02-06 MED ORDER — GLYCOPYRROLATE 0.2 MG/ML IJ SOLN
INTRAMUSCULAR | Status: DC | PRN
  Administered 2014-02-06: 0.2 mg via INTRAVENOUS
  Administered 2014-02-06: 0.4 mg via INTRAVENOUS

## 2014-02-06 MED ORDER — ROCURONIUM BROMIDE 100 MG/10ML IV SOLN
INTRAVENOUS | Status: DC | PRN
  Administered 2014-02-06: 40 mg via INTRAVENOUS

## 2014-02-06 MED ORDER — BUPIVACAINE HCL (PF) 0.5 % IJ SOLN
INTRAMUSCULAR | Status: AC
  Filled 2014-02-06: qty 30

## 2014-02-06 MED ORDER — LIDOCAINE HCL (CARDIAC) 20 MG/ML IV SOLN
INTRAVENOUS | Status: AC
  Filled 2014-02-06: qty 5

## 2014-02-06 MED ORDER — LIDOCAINE HCL (CARDIAC) 20 MG/ML IV SOLN
INTRAVENOUS | Status: DC | PRN
  Administered 2014-02-06: 40 mg via INTRAVENOUS

## 2014-02-06 MED ORDER — PANTOPRAZOLE SODIUM 40 MG PO TBEC
40.0000 mg | DELAYED_RELEASE_TABLET | Freq: Every day | ORAL | Status: DC
Start: 2014-02-06 — End: 2014-02-08
  Administered 2014-02-07 – 2014-02-08 (×2): 40 mg via ORAL
  Filled 2014-02-06 (×2): qty 1

## 2014-02-06 MED ORDER — AMLODIPINE BESYLATE 10 MG PO TABS
10.0000 mg | ORAL_TABLET | Freq: Every day | ORAL | Status: DC
  Administered 2014-02-07: 10 mg via ORAL
  Filled 2014-02-06 (×2): qty 1

## 2014-02-06 MED ORDER — ONDANSETRON HCL 4 MG/2ML IJ SOLN: 4.0000 mg | Freq: Once | INTRAMUSCULAR | Status: DC | PRN

## 2014-02-06 MED ORDER — SIMETHICONE 80 MG PO CHEW
80.0000 mg | CHEWABLE_TABLET | Freq: Four times a day (QID) | ORAL | Status: DC | PRN
  Administered 2014-02-07: 80 mg via ORAL
  Filled 2014-02-06: qty 1

## 2014-02-06 MED ORDER — DEXTROSE-NACL 5-0.45 % IV SOLN
INTRAVENOUS | Status: DC
  Administered 2014-02-06 – 2014-02-07 (×2): via INTRAVENOUS

## 2014-02-06 MED ORDER — ONDANSETRON HCL 4 MG PO TABS
4.0000 mg | ORAL_TABLET | Freq: Four times a day (QID) | ORAL | Status: DC | PRN
  Administered 2014-02-07: 4 mg via ORAL
  Filled 2014-02-06: qty 1

## 2014-02-06 MED ORDER — LACTATED RINGERS IV SOLN
INTRAVENOUS | Status: DC
  Administered 2014-02-06 (×2): via INTRAVENOUS

## 2014-02-06 MED ORDER — BUPIVACAINE HCL (PF) 0.5 % IJ SOLN
INTRAMUSCULAR | Status: DC | PRN
  Administered 2014-02-06: 30 mL

## 2014-02-06 MED ORDER — ATENOLOL 50 MG PO TABS
50.0000 mg | ORAL_TABLET | Freq: Every day | ORAL | Status: DC
  Administered 2014-02-07: 50 mg via ORAL
  Filled 2014-02-06 (×2): qty 1

## 2014-02-06 MED ORDER — DEXAMETHASONE SODIUM PHOSPHATE 4 MG/ML IJ SOLN
INTRAMUSCULAR | Status: DC | PRN
  Administered 2014-02-06: 4 mg via INTRAVENOUS

## 2014-02-06 MED ORDER — MIDAZOLAM HCL 2 MG/2ML IJ SOLN
INTRAMUSCULAR | Status: AC
  Filled 2014-02-06: qty 2

## 2014-02-06 MED ORDER — PRAVASTATIN SODIUM 80 MG PO TABS
80.0000 mg | ORAL_TABLET | Freq: Every day | ORAL | Status: DC
  Administered 2014-02-07 – 2014-02-08 (×2): 80 mg via ORAL
  Filled 2014-02-06 (×2): qty 1

## 2014-02-06 MED ORDER — LATANOPROST 0.005 % OP SOLN
1.0000 [drp] | Freq: Every day | OPHTHALMIC | Status: DC
  Administered 2014-02-07: 1 [drp] via OPHTHALMIC

## 2014-02-06 MED ORDER — MAGNESIUM CITRATE PO SOLN
1.0000 | Freq: Once | ORAL | Status: AC | PRN
  Filled 2014-02-06: qty 296

## 2014-02-06 SURGICAL SUPPLY — 41 items
APL SKNCLS STERI-STRIP NONHPOA (GAUZE/BANDAGES/DRESSINGS) ×1
BENZOIN TINCTURE PRP APPL 2/3 (GAUZE/BANDAGES/DRESSINGS) ×4 IMPLANT
BINDER ABD UNIV 10 28-50 (GAUZE/BANDAGES/DRESSINGS) ×2 IMPLANT
BINDER ABDOM UNIV 10 (GAUZE/BANDAGES/DRESSINGS) ×4
BLADE SURG 10 STRL SS (BLADE) ×8 IMPLANT
CANISTER SUCT 3000ML (MISCELLANEOUS) ×4 IMPLANT
CHLORAPREP W/TINT 26ML (MISCELLANEOUS) ×4 IMPLANT
CLOSURE WOUND 1/2 X4 (GAUZE/BANDAGES/DRESSINGS) ×1
CLOTH BEACON ORANGE TIMEOUT ST (SAFETY) ×4 IMPLANT
CONT PATH 16OZ SNAP LID 3702 (MISCELLANEOUS) ×4 IMPLANT
DRAPE WARM FLUID 44X44 (DRAPE) ×4 IMPLANT
DRSG TELFA 3X8 NADH (GAUZE/BANDAGES/DRESSINGS) ×4 IMPLANT
GAUZE SPONGE 4X4 12PLY STRL (GAUZE/BANDAGES/DRESSINGS) ×4 IMPLANT
GAUZE SPONGE 4X4 16PLY XRAY LF (GAUZE/BANDAGES/DRESSINGS) ×4 IMPLANT
GLOVE BIO SURGEON STRL SZ7 (GLOVE) ×4 IMPLANT
GLOVE BIOGEL PI IND STRL 7.0 (GLOVE) ×2 IMPLANT
GLOVE BIOGEL PI INDICATOR 7.0 (GLOVE) ×2
GOWN STRL REUS W/TWL LRG LVL3 (GOWN DISPOSABLE) ×8 IMPLANT
NEEDLE HYPO 25X1 1.5 SAFETY (NEEDLE) ×4 IMPLANT
NS IRRIG 1000ML POUR BTL (IV SOLUTION) ×4 IMPLANT
PACK ABDOMINAL GYN (CUSTOM PROCEDURE TRAY) ×4 IMPLANT
PAD ABD 7.5X8 STRL (GAUZE/BANDAGES/DRESSINGS) ×4 IMPLANT
PAD OB MATERNITY 4.3X12.25 (PERSONAL CARE ITEMS) ×4 IMPLANT
PROTECTOR NERVE ULNAR (MISCELLANEOUS) ×4 IMPLANT
SPONGE GAUZE 4X4 12PLY STER LF (GAUZE/BANDAGES/DRESSINGS) ×4 IMPLANT
SPONGE LAP 18X18 X RAY DECT (DISPOSABLE) ×8 IMPLANT
STAPLER VISISTAT 35W (STAPLE) ×4 IMPLANT
STRIP CLOSURE SKIN 1/2X4 (GAUZE/BANDAGES/DRESSINGS) ×3 IMPLANT
SUT VIC AB 0 CT1 18XCR BRD8 (SUTURE) ×2 IMPLANT
SUT VIC AB 0 CT1 27 (SUTURE) ×4
SUT VIC AB 0 CT1 27XBRD ANBCTR (SUTURE) ×4 IMPLANT
SUT VIC AB 0 CT1 8-18 (SUTURE) ×2
SUT VIC AB 3-0 CT1 27 (SUTURE) ×2
SUT VIC AB 3-0 CT1 TAPERPNT 27 (SUTURE) ×2 IMPLANT
SUT VIC AB 4-0 KS 27 (SUTURE) ×4 IMPLANT
SUT VICRYL 0 TIES 12 18 (SUTURE) ×4 IMPLANT
SYR CONTROL 10ML LL (SYRINGE) ×4 IMPLANT
TAPE CLOTH SURG 4X10 WHT LF (GAUZE/BANDAGES/DRESSINGS) ×4 IMPLANT
TOWEL OR 17X24 6PK STRL BLUE (TOWEL DISPOSABLE) ×8 IMPLANT
TRAY FOLEY CATH 14FR (SET/KITS/TRAYS/PACK) ×4 IMPLANT
WATER STERILE IRR 1000ML POUR (IV SOLUTION) ×4 IMPLANT

## 2014-02-06 NOTE — Discharge Instructions (Signed)
Laparotomy °Care After °Refer to this sheet in the next few weeks. These instructions provide you with information on caring for yourself after your procedure. Your caregiver may also give you more specific instructions. Your treatment has been planned according to current medical practices, but problems sometimes occur. Call your caregiver if you have any problems or questions after your procedure. °HOME CARE INSTRUCTIONS °ACTIVITY °· Rest as much as possible the first two weeks at home. °· Avoid strenuous activity such as heavy lifting (more than 10 pounds), pushing, or pulling. Limit stair climbing to once or twice a day for the first week, then slowly increase this activity. °· Take frequent rest periods throughout the day. °· Talk with your caregiver about when you may resume your usual physical activity. °· You need to be out of bed and walking as much as possible. This decreases the chance of: °¨ Blood clots. °¨ Pneumonia. °NUTRITION °· You can resume your normal diet once you regain bowel function. °· Drink plenty of fluids (6-8 glasses a day or as instructed by your caregiver). °· Eat a well-balanced diet. °· Daily portions of food from the meat (protein), milk, vegetable, and bread groups are necessary for your health. °ELIMINATION °It is very important not to strain during bowel movements. If constipation should occur, you may: °· Take a mild laxative. °· Add fruit and bran to your diet. °· Drink more fluids. °HYGIENE °· Take showers, not baths, until 4-6 weeks after surgery. °· If your incision is closed, you may take a shower or tub bath. °FEVER °If you feel feverish or have shaking chills, take your temperature. If it is 102° F (38.9° C), call your caregiver. The fever may mean there is an infection. °PAIN CONTROL °· Mild discomfort may occur. °· Only take over-the-counter or prescription medicines for pain, discomfort, or fever as directed by your caregiver. Take any prescribed medicines exactly as  directed. °INCISION CARE °· Keep your incision site clean with soap and water. °· Do not use a dressing unless your cut (incision) from surgery is draining or irritated. °· If you have small adhesive strips in place and they do not fall off within 10 days, carefully peel them off. °· Check your incision and surrounding area daily for any redness, swelling, discoloration, heavy drainage, or separation of the skin. °SEXUAL INTERCOURSE °Do not have sexual intercourse until after your follow-up appointment, unless your caregiver tells you otherwise. °SEEK MEDICAL CARE IF:  °· You are unable to tolerate food or drinks. °· You are unable to pass gas or have a bowel movement. °· Your pain becomes more severe or is not relieved with medicines. °· You have redness, swelling, discoloration, heavy drainage, or separation of the skin at the incision site. °Document Released: 10/01/2003 Document Revised: 02/03/2012 Document Reviewed: 02/15/2007 °ExitCare® Patient Information ©2015 [employer]. This information is not intended to replace advice given to you by your health care provider. Make sure you discuss any questions you have with your health care provider. ° °

## 2014-02-06 NOTE — Brief Op Note (Signed)
02/06/2014  1:44 PM  PATIENT:  Delman Cheadle Cregger  79 y.o. female  PRE-OPERATIVE DIAGNOSIS:  LEFT OVARIAN CYST  POST-OPERATIVE DIAGNOSIS:  No pelvic mass noted  PROCEDURE:  Procedure(s): EXPLORATORY LAPAROTOMY (N/A)  SURGEON:  Surgeon(s) and Role:    * Lavonia Drafts, MD - Primary    * Woodroe Mode, MD - Assisting  ANESTHESIA:   general  EBL:  Total I/O In: 1200 [I.V.:1200] Out: 225 [Urine:200; Blood:25]  BLOOD ADMINISTERED:none  DRAINS: none   LOCAL MEDICATIONS USED:  MARCAINE     SPECIMEN:  No Specimen  DISPOSITION OF SPECIMEN:  N/A  COUNTS:  YES  TOURNIQUET:  * No tourniquets in log *  DICTATION: .Note written in EPIC  PLAN OF CARE: Admit to inpatient   PATIENT DISPOSITION:  PACU - hemodynamically stable.   Delay start of Pharmacological VTE agent (>24hrs) due to surgical blood loss or risk of bleeding: not applicable

## 2014-02-06 NOTE — Anesthesia Postprocedure Evaluation (Signed)
  Anesthesia Post-op Note  Patient: Debra [name]  Procedure(s) Performed: Procedure(s): EXPLORATORY LAPAROTOMY (N/A)  Patient Location: Womens unit  Anesthesia Type:General  Level of Consciousness: awake, alert  and oriented  Airway and Oxygen Therapy: Patient Spontanous Breathing  Post-op Pain: mild  Post-op Assessment: Post-op Vital signs reviewed, Respiratory Function Stable, Patent Airway and No signs of Nausea or vomiting  Post-op Vital Signs: Reviewed and stable  Last Vitals:  Filed Vitals:   02/06/14 1513  BP: 148/62  Pulse: 67  Temp: 37.5 C  Resp: 18    Complications: No apparent anesthesia complications

## 2014-02-06 NOTE — Op Note (Signed)
02/06/2014  1:44 PM  PATIENT:  Debra [name]  79 y.o. female  PRE-OPERATIVE DIAGNOSIS:  LEFT OVARIAN CYST  POST-OPERATIVE DIAGNOSIS:  No pelvic mass noted  PROCEDURE:  Procedure(s): EXPLORATORY LAPAROTOMY (N/A)  SURGEON:  Surgeon(s) and Role:    * Lavonia Drafts, MD - Primary    * Woodroe Mode, MD - Assisting  ANESTHESIA:   general  EBL:  Total I/O In: 1200 [I.V.:1200] Out: 225 [Urine:200; Blood:25]  BLOOD ADMINISTERED:none  DRAINS: none   LOCAL MEDICATIONS USED:  MARCAINE     SPECIMEN:  No Specimen  DISPOSITION OF SPECIMEN:  N/A  COUNTS:  YES  TOURNIQUET:  * No tourniquets in log *  DICTATION: .Note written in EPIC  PLAN OF CARE: Admit to inpatient   PATIENT DISPOSITION:  PACU - hemodynamically stable.   Delay start of Pharmacological VTE agent (>24hrs) due to surgical blood loss or risk of bleeding: not applicable  Complications: none immediate  INDICATIONS: The patient is a 79 y.o. [id number] with history of pelvic pain and a left adnexal mass on CT who desired definitive surgical management. On the preoperative visit, the risks, benefits, indications, and alternatives of the procedure were reviewed with the patient.  On the day of surgery, the risks of surgery were again discussed with the patient including but not limited to: bleeding which may require transfusion or reoperation; infection which may require antibiotics; injury to bowel, bladder, ureters or other surrounding organs; need for additional procedures; thromboembolic phenomenon, incisional problems and other postoperative/anesthesia complications. Written informed consent was obtained.    OPERATIVE FINDINGS: There were no palpable or visible masses in the pelvis.  The colon appeared full of stool. Last colonoscopy 2006. There was some pelvic adhesions with fluid between the adhesions but, no masses.  The left ovary was not palpable but the sidewall was not dissected out as there was nothing  palpable. No other anomalies noted in pelvis.     DESCRIPTION OF PROCEDURE:  The patient received intravenous antibiotics and had sequential compression devices applied to her lower extremities while in the preoperative area.   She was taken to the operating room and placed under general anesthesia without difficulty.The abdomen and perineum were prepped and draped in a sterile manner, and she was placed in a dorsal supine position.  A Foley catheter was inserted into the bladder and attached to constant drainage. After an adequate timeout was performed, a Pfannensteil skin incision was made. This incision was taken down to the fascia using electrocautery with care given to maintain good hemostasis. The fascia was incised in the midline and the fascial incision was then extended bilaterally using electrocautery without difficulty. Of note there was metal suture or clips noted in the fascia frm prior surgery.  The fascia was then dissected off the underlying rectus muscles using blunt and sharp dissection. The rectus muscles were split bluntly in the midline and the peritoneum entered sharply without complication. This peritoneal incision was then extended superiorly and inferiorly with care given to prevent bowel or bladder injury.  Attention was then turned to the pelvis. The bowel was packed away with moist laparotomy sponges. A survey of th patients pelvis revealed the above findings.  The pelvis was irrigated.  There was excellent hemostasis.  All laparotomy sponges and instruments were removed from the abdomen. The peritoneum was closed with a 0 Vicryl in an interrupted suture. The fascia was closed in a running fashion with 0 vicryl suture. The subcutaneous layer was irrigated and  injected with 30 ml of 0.5% Marcaine. The skin was closed with a 4-0 Vicryl subcuticular stitch. Benzoin and steri strips were applied.  Sponge, lap, needle, and instrument counts were correct times two. The patient was taken to  the recovery area awake, extubated and in stable condition.  Dandra Shambaugh L. Ihor Dow, MD, [place] [facility] [place], [facility]

## 2014-02-06 NOTE — Anesthesia Preprocedure Evaluation (Signed)
Anesthesia Evaluation  Patient identified by MRN, date of birth, ID band Patient awake    Reviewed: Allergy & Precautions, H&P , Patient's Chart, lab work & pertinent test results  Airway Mallampati: II  TM Distance: >3 FB Neck ROM: full    Dental no notable dental hx. (+) Edentulous Upper   Pulmonary  breath sounds clear to auscultation  Pulmonary exam normal       Cardiovascular Exercise Tolerance: Good hypertension (No sx of CAD, EKG okay, upto date with BP meds), On Medications Supra Ventricular Tachycardia Rhythm:regular Rate:Normal     Neuro/Psych    GI/Hepatic   Endo/Other    Renal/GU      Musculoskeletal   Abdominal   Peds  Hematology   Anesthesia Other Findings   Reproductive/Obstetrics                             Anesthesia Physical Anesthesia Plan  ASA: III  Anesthesia Plan: General   Post-op Pain Management:    Induction: Intravenous  Airway Management Planned: Oral ETT  Additional Equipment:   Intra-op Plan:   Post-operative Plan: Extubation in OR  Informed Consent: I have reviewed the patients History and Physical, chart, labs and discussed the procedure including the risks, benefits and alternatives for the proposed anesthesia with the patient or authorized representative who has indicated his/her understanding and acceptance.   Dental Advisory Given and Dental advisory given  Plan Discussed with: CRNA and Surgeon  Anesthesia Plan Comments: (  Discussed general anesthesia, including possible nausea, instrumentation of airway, sore throat,pulmonary aspiration, etc. I asked if the were any outstanding questions, or  concerns before we proceeded. )        Anesthesia Quick Evaluation

## 2014-02-06 NOTE — Transfer of Care (Signed)
Immediate Anesthesia Transfer of Care Note  Patient: Debra [name]  Procedure(s) Performed: Procedure(s): EXPLORATORY LAPAROTOMY (N/A)  Patient Location: PACU  Anesthesia Type:General  Level of Consciousness: awake, alert  and oriented  Airway & Oxygen Therapy: Patient Spontanous Breathing and Patient connected to nasal cannula oxygen  Post-op Assessment: Report given to PACU RN, Post -op Vital signs reviewed and stable and Patient moving all extremities X 4  Post vital signs: Reviewed and stable  Complications: No apparent anesthesia complications

## 2014-02-06 NOTE — Anesthesia Postprocedure Evaluation (Signed)
  Anesthesia Post-op Note  Anesthesia Post Note  Patient: Debra [name]  Procedure(s) Performed: Procedure(s) (LRB): EXPLORATORY LAPAROTOMY (N/A)  Anesthesia type: General  Patient location: PACU  Post pain: Pain level controlled  Post assessment: Post-op Vital signs reviewed  Last Vitals:  Filed Vitals:   02/06/14 1445  BP: 145/63  Pulse: 68  Temp: 37 C  Resp: 15    Post vital signs: Reviewed  Level of consciousness: sedated  Complications: No apparent anesthesia complications

## 2014-02-06 NOTE — H&P (Signed)
SYDNY SCHNITZLER (MR# [id number])      Progress Notes Info    Author Note Status Last Update User Last Update Date/Time   Lavonia Drafts, MD Signed Lavonia Drafts, MD 12/07/2013 4:58 PM    Progress Notes    Expand All Collapse All   Patient ID: Debra [name], female DOB: [birth year], 79 y.o. MRN: [id number] Pt presents with her daughter for discussion of enlarging ovarian cyst. Pt reports that she does occ have pain on her left side. She denies weight loss or increased abdominal girth. No fever or chills and no anorexia.  Past Medical History  Diagnosis Date  . Pure hypercholesterolemia   . Lumbago   . Female stress incontinence   . Paroxysmal supraventricular tachycardia     Hx  . Unspecified glaucoma     bilateral  . Diverticulosis of colon with hemorrhage   . Hypertension   . SVD (spontaneous vaginal delivery)     x 4  . Missed abortion     x 2 - no surgery required  . PONV (postoperative nausea and vomiting)   . Dysrhythmia     Hx irregular heart beat - 15 yrs ago  . Constipation   . Diarrhea   . Headache     otc med prn  . Degeneration of lumbar or lumbosacral intervertebral disc     arthritis in back, hips and hands - otc med prn  . Cancer     hand, face - spots removed  . Brain mass     monitor by Dr Trenton Gammon - right posterior fossa meningioma - thinks it's benign per patient   Past Surgical History  Procedure Laterality Date  . Abdominal hysterectomy    . Appendectomy    . Eye surgery      bilateral cataract eye surgery  . Colonoscopy    . Multiple tooth extractions      upper  . Tubal ligation    . Eye surgery      laser to relieve eye pressure - bilateral  . Right hand surgery      ganglion cyst removed   No current facility-administered medications on file prior to encounter.   Current Outpatient Prescriptions on File Prior to Encounter  Medication Sig Dispense Refill  . amLODipine (NORVASC) 10 MG tablet Take 1  tablet (10 mg total) by mouth daily. 30 tablet 6  . atenolol (TENORMIN) 50 MG tablet TAKE 1 TABLET (50 MG TOTAL) BY MOUTH DAILY. 30 tablet 5  . cloNIDine (CATAPRES) 0.1 MG tablet Take 1 tablet in AM and 2 tablets at bedtime 270 tablet 1  . lisinopril (PRINIVIL,ZESTRIL) 40 MG tablet Take 40 mg by mouth daily. Takes one half in the morning and one half at night    . brimonidine (ALPHAGAN) 0.2 % ophthalmic solution Place 1 drop into both eyes 2 (two) times daily.    . cholecalciferol (VITAMIN D) 400 UNITS TABS tablet Take 400 Units by mouth daily.    . cyanocobalamin (,VITAMIN B-12,) 1000 MCG/ML injection Inject 1,000 mcg into the muscle every 30 (thirty) days.    Marland Kitchen latanoprost (XALATAN) 0.005 % ophthalmic solution Place 1 drop into both eyes at bedtime.     Marland Kitchen loperamide (IMODIUM) 2 MG capsule Take 1 capsule (2 mg total) by mouth as needed for diarrhea or loose stools. 30 capsule 0  . pravastatin (PRAVACHOL) 80 MG tablet TAKE 1 TABLET BY MOUTH ONCE A DAY 90 tablet 2  . timolol (TIMOPTIC) 0.5 %  ophthalmic solution Place 1 drop into both eyes 2 (two) times daily.     Marland Kitchen triamcinolone cream (KENALOG) 0.1 % Apply 1 application topically 2 (two) times daily. 30 g 0   Allergies  Allergen Reactions  . Codeine Phosphate     REACTION: unspecified  . Sulfa Antibiotics     unknown  . Latex Hives, Itching and Rash   History   Social History  . Marital Status: Married    Spouse Name: N/A    Number of Children: N/A  . Years of Education: N/A   Occupational History  . raised tobacco    Social History Main Topics  . Smoking status: Never Smoker   . Smokeless tobacco: Never Used  . Alcohol Use: No  . Drug Use: No  . Sexual Activity: Not Currently    Birth Control/ Protection: None, Post-menopausal, Surgical   Other Topics Concern  . Not on file   Social History Narrative    Has living will.  Daughter is HCPOA, Stephanie Acre.   Full code.  PE.BP 177/72 mmHg  Pulse 68  Temp(Src) 98.1 F  (36.7 C) (Oral)  Resp 18  SpO2 98% Pt in NAD Lungs: CTA CV: RRR Abd: obese, NT, ND.  Well healed vertical incision. Ext: no edema.  CBC    Component Value Date/Time   WBC 3.7* 02/01/2014 1030   WBC 4.4 09/18/2013 1323   WBC 3.5* 06/04/2009 1057   RBC 4.69 02/01/2014 1030   RBC 4.89 09/18/2013 1323   RBC 4.54 06/04/2009 1057   RBC 3.39* 10/31/2008 1500   HGB 13.9 02/01/2014 1030   HGB 14.3 09/18/2013 1323   HGB 13.1 06/04/2009 1057   HCT 41.4 02/01/2014 1030   HCT 43.0 09/18/2013 1323   HCT 37.6 06/04/2009 1057   PLT 169 02/01/2014 1030   PLT 160 09/18/2013 1323   PLT 245 06/04/2009 1057   MCV 88.3 02/01/2014 1030   MCV 87.9 09/18/2013 1323   MCV 83 06/04/2009 1057   MCH 29.6 02/01/2014 1030   MCH 29.2 09/18/2013 1323   MCH 28.7 06/04/2009 1057   MCHC 33.6 02/01/2014 1030   MCHC 33.3 09/18/2013 1323   MCHC 34.7 06/04/2009 1057   RDW 13.5 02/01/2014 1030   RDW 13.8 09/18/2013 1323   RDW 11.6 06/04/2009 1057   LYMPHSABS 1.1 09/18/2013 1323   LYMPHSABS 1.0 08/06/2013 1602   LYMPHSABS 0.9 06/04/2009 1057   MONOABS 0.3 09/18/2013 1323   MONOABS 0.5 08/06/2013 1602   EOSABS 0.1 09/18/2013 1323   EOSABS 0.1 08/06/2013 1602   EOSABS 0.1 06/04/2009 1057   BASOSABS 0.0 09/18/2013 1323   BASOSABS 0.0 08/06/2013 1602   BASOSABS 0.0 06/04/2009 1057        08/24/2013 CA125 7.3 08/24/2013 TECHNIQUE:  Both transabdominal and transvaginal ultrasound examinations of the  pelvis were performed. Transabdominal technique was performed for  global imaging of the pelvis including uterus, ovaries, adnexal  regions, and pelvic cul-de-sac. It was necessary to proceed with  endovaginal exam following the transabdominal exam to visualize the  left adnexa.  COMPARISON: CT 08/06/2013  FINDINGS:  Uterus  Measurements: Not applicable.  Endometrium  Thickness: Not applicable.  Right ovary  Measurements: Not applicable.  Left ovary  Measurements: 6.4 x 3.8 x  3.4 cm cyst is identified within the left  ovary. No complicating features are identified. Specifically no  septation or nodularity identified. No normal ovarian tissue  identified.  Other findings  No free fluid.  IMPRESSION:  1.  Left ovarian cyst. This is almost certainly benign, but follow up  ultrasound is recommended in 1 year according to the Society of  Radiologists in [place] Statement (D  Clovis Riley et al. Management of Asymptomatic Ovarian and Other Adnexal  Cysts Imaged at [place]: Society of Radiologists in [place] Statement 2010. Radiology 256 (Sept 2010): 704-888.).   11/24/2013 TECHNIQUE:  Transvaginal ultrasound examination of the pelvis was performed  including evaluation of the uterus, ovaries, adnexal regions, and  pelvic cul-de-sac.  COMPARISON: 08/24/2013 ultrasound, CT abdomen/pelvis 04/29/2006  FINDINGS:  Uterus  Measurements: Surgically absent. No midline mass.  Endometrium  Thickness: Surgically absent.  Right ovary  Measurements: Surgically absent.  Left ovary  Measurements: Cyst with linear low level internal echoes measuring  6.6 x 5.9 x 3.6 cm. Normal appearance/no adnexal mass.  Other findings: No free fluid  IMPRESSION:  Interval increase in size of left ovarian/adnexal cyst. Given the  interval increase in size, consider surgical resection preceded by  preoperative evaluation with pelvic MRI with contrast for better  definition of the internal contents of this left adnexal/ ovarian  cyst.  These results will be called to the ordering clinician or  representative by the Radiologist Assistant, and communication  documented in the PACS or zVision Dashboard.    A) discussed with pt and her daughter Slight increase in ovarian cyst. Given that pt is post menopausal, I would not anticipate any change in the cyst size. Discussed management options and pt and her daughter  would like to have the cyst and the ovary removed.  Pt had elevated BP's on the first date of surgery and the case was cancelled.  Her BP has been managed by her cardiologist and primary care physician.   P) Patient desires surgical management with left oophorectomy via laparotomy. The risks of surgery were discussed in detail with the patient including but not limited to: bleeding which may require transfusion or reoperation; infection which may require prolonged hospitalization or re-hospitalization and antibiotic therapy; injury to bowel, bladder, ureters and major vessels or other surrounding organs; need for additional procedures including laparotomy; thromboembolic phenomenon, incisional problems and other postoperative or anesthesia complications. Patient was told that the likelihood that her condition and symptoms will be treated effectively with this surgical management was very high; the postoperative expectations were also discussed in detail. The patient also understands the alternative treatment options which were discussed in full. All questions were answered

## 2014-02-07 ENCOUNTER — Encounter: Payer: Self-pay | Admitting: Physician Assistant

## 2014-02-07 ENCOUNTER — Encounter (HOSPITAL_COMMUNITY): Payer: Self-pay | Admitting: Obstetrics & Gynecology

## 2014-02-07 ENCOUNTER — Telehealth: Payer: Self-pay | Admitting: *Deleted

## 2014-02-07 ENCOUNTER — Other Ambulatory Visit: Payer: Self-pay | Admitting: *Deleted

## 2014-02-07 DIAGNOSIS — N393 Stress incontinence (female) (male): Secondary | ICD-10-CM

## 2014-02-07 DIAGNOSIS — K5900 Constipation, unspecified: Secondary | ICD-10-CM

## 2014-02-07 DIAGNOSIS — Z0000 Encounter for general adult medical examination without abnormal findings: Secondary | ICD-10-CM

## 2014-02-07 NOTE — Telephone Encounter (Signed)
Called patients daughter and she stated that they would like her mother to see Dr. Jeffie Pollock for urology and no preference for GI.

## 2014-02-07 NOTE — Telephone Encounter (Signed)
New referral put in with Dr. Eugenie Norrie note. Will followup on this tomorrow.

## 2014-02-07 NOTE — Telephone Encounter (Signed)
-----   Message from Lavonia Drafts, MD sent at 02/07/2014 10:09 AM EST ----- We need to get those records.  She has constipation and had a mass on CT in the adnexa that wa not seen on laparotomy.     Thx, clh-S  ----- Message -----    From: Celene Squibb Rash, LPN    Sent: 40/10/8117   9:54 AM      To: Curly [place], RN, #  Referral made to [facility]  Dr. Jeffie Pollock Appointment is on 04/04/14 at 1:30 pm. They are sending patient information in the mail.   I called [facility] and they stated that patient cannot be referred because her last colonoscopy was in 2006 and she is not yet due, if there is a medical reason that she needs it early patient will need to sign release for [place] to get those records from her last colonoscopy.    Thanks,   Estill Bamberg

## 2014-02-07 NOTE — Plan of Care (Signed)
Problem: Phase I Progression Outcomes Goal: Pain controlled with appropriate interventions Outcome: Completed/Met Date Met:  02/07/14 Goal: Admission history reviewed Outcome: Completed/Met Date Met:  02/07/14 Goal: Dangle/OOB as tolerated per MD order Outcome: Completed/Met Date Met:  02/07/14 Goal: VS, stable, temp < 100.4 degrees F Outcome: Completed/Met Date Met:  02/07/14 Goal: I & O every 4 hrs or as ordered Outcome: Completed/Met Date Met:  02/07/14 Goal: IS, TCDB as ordered Outcome: Completed/Met Date Met:  02/07/14     

## 2014-02-07 NOTE — Telephone Encounter (Signed)
Scheduled with Dr. Jeffie Pollock at [facility]rology on 04/04/14 at 1330. Patient is too early for her colonoscopy, patients daughter informed, she stated that her mother will call back to schedule the colonoscopy at a later date.

## 2014-02-07 NOTE — Progress Notes (Signed)
1 Day Post-Op Procedure(s) (LRB): EXPLORATORY LAPAROTOMY (N/A)  Subjective: Patient reports tolerating PO and no problems voiding.  Pt denies flatus.  Has had burping.  Objective: I have reviewed patient's vital signs, intake and output, medications and labs.  General: alert and no distress GI: soft, non-tender; bowel sounds normal; no masses,  no organomegaly and incision: clean, dry and intact Extremities: extremities normal, atraumatic, no cyanosis or edema  CBC    Component Value Date/Time   WBC 3.7* 02/01/2014 1030   WBC 4.4 09/18/2013 1323   WBC 3.5* 06/04/2009 1057   RBC 4.69 02/01/2014 1030   RBC 4.89 09/18/2013 1323   RBC 4.54 06/04/2009 1057   RBC 3.39* 10/31/2008 1500   HGB 13.9 02/01/2014 1030   HGB 14.3 09/18/2013 1323   HGB 13.1 06/04/2009 1057   HCT 41.4 02/01/2014 1030   HCT 43.0 09/18/2013 1323   HCT 37.6 06/04/2009 1057   PLT 169 02/01/2014 1030   PLT 160 09/18/2013 1323   PLT 245 06/04/2009 1057   MCV 88.3 02/01/2014 1030   MCV 87.9 09/18/2013 1323   MCV 83 06/04/2009 1057   MCH 29.6 02/01/2014 1030   MCH 29.2 09/18/2013 1323   MCH 28.7 06/04/2009 1057   MCHC 33.6 02/01/2014 1030   MCHC 33.3 09/18/2013 1323   MCHC 34.7 06/04/2009 1057   RDW 13.5 02/01/2014 1030   RDW 13.8 09/18/2013 1323   RDW 11.6 06/04/2009 1057   LYMPHSABS 1.1 09/18/2013 1323   LYMPHSABS 1.0 08/06/2013 1602   LYMPHSABS 0.9 06/04/2009 1057   MONOABS 0.3 09/18/2013 1323   MONOABS 0.5 08/06/2013 1602   EOSABS 0.1 09/18/2013 1323   EOSABS 0.1 08/06/2013 1602   EOSABS 0.1 06/04/2009 1057   BASOSABS 0.0 09/18/2013 1323   BASOSABS 0.0 08/06/2013 1602   BASOSABS 0.0 06/04/2009 1057      Assessment: s/p Procedure(s): EXPLORATORY LAPAROTOMY (N/A): stable and progressing well  Plan: Advance diet Encourage ambulation Discharge home in am if meets discharge criteria GI and urology consult as outptatient  LOS: 1 day    HARRAWAY-SMITH, Tyner Codner 02/07/2014, 12:38 PM

## 2014-02-07 NOTE — Progress Notes (Signed)
I offered support to pt and her husband at nurse's referral.  They are disappointed that she will be staying over another day, but they understand that is for pt's benefit.  They reported that they are coping well and that they have good family support.  Please page as needs arise, [place] 4:40 PM

## 2014-02-07 NOTE — Telephone Encounter (Signed)
Patient states that Dr. Henrene Pastor did her last colonoscopy. Will call back to [facility] to find out if they have the records.

## 2014-02-08 LAB — TYPE AND SCREEN
ABO/RH(D): O NEG
ANTIBODY SCREEN: POSITIVE
DAT, IgG: NEGATIVE
UNIT DIVISION: 0
Unit division: 0

## 2014-02-08 MED ORDER — OXYCODONE-ACETAMINOPHEN 5-325 MG PO TABS: 1.0000 | ORAL_TABLET | ORAL | Status: DC | PRN

## 2014-02-08 MED ORDER — IBUPROFEN 800 MG PO TABS: 800.0000 mg | ORAL_TABLET | Freq: Three times a day (TID) | ORAL | Status: DC | PRN

## 2014-02-08 NOTE — Plan of Care (Signed)
Problem: Consults Goal: GYN POST OP OBSV 2 days Patient Education (See Patient Education module for education specifics.)  Outcome: Completed/Met Date Met:  02/08/14

## 2014-02-08 NOTE — Discharge Summary (Signed)
Physician Discharge Summary  Patient ID: Debra [name] MRN: [id number] DOB/AGE: Jul 02, 1934 79 y.o.  Admit date: 02/06/2014 Discharge date: 02/08/2014  Admission Diagnoses: left adnexal mass   Discharge Diagnoses: no mass in pelvis noted; pelvic adhesions Active Problems:   Pelvic pain in female   Post-operative state   Discharged Condition: good  Hospital Course: Pt was admitted for exploratory laparotomy.  She underwent surgery and on POD #0 she had a morphine PCA and a foley catheter draining her bladder.    She had no complications overnight.  Her foley catheter was removed POD#1.  She was able to void without difficulty.  Her PCA was stopped on POD#1.  She had good pain control on Percocet and Motrin.  She was able to  tolerate a regular diet but, she had no flatus.   She was encouraged to ambulate.  By POD# 2, she was tolerating a regular diet.  She had flatus and, had no n/v with 2 days of a regular diet.  She was deemed stable to be discharged to home. All questions were answered to her and her family's satisfaction.    Consults: None  Significant Diagnostic Studies: labs: CBC  Treatments: surgery: exploratory laparotomy  Discharge Exam: Blood pressure 153/56, pulse 57, temperature 98.9 F (37.2 C), temperature source Oral, resp. rate 16, height 5\' 3"  (1.6 m), weight 183 lb (83.008 kg), SpO2 94 %. General appearance: alert and no distress GI: soft, non-tender; bowel sounds normal; no masses,  no organomegaly Incision/Wound:clean, dry and intact.   Disposition: 01-Home or Self Care  Discharge Instructions    Call MD for:  difficulty breathing, headache or visual disturbances    Complete by:  As directed      Call MD for:  extreme fatigue    Complete by:  As directed      Call MD for:  hives    Complete by:  As directed      Call MD for:  persistant dizziness or light-headedness    Complete by:  As directed      Call MD for:  persistant nausea and vomiting     Complete by:  As directed      Call MD for:  redness, tenderness, or signs of infection (pain, swelling, redness, odor or green/yellow discharge around incision site)    Complete by:  As directed      Call MD for:  severe uncontrolled pain    Complete by:  As directed      Call MD for:  temperature >100.4    Complete by:  As directed      Diet - low sodium heart healthy    Complete by:  As directed      Discharge wound care:    Complete by:  As directed   Keep incision clean and dry     Driving Restrictions    Complete by:  As directed   No driving for 4 weeks     Increase activity slowly    Complete by:  As directed      Lifting restrictions    Complete by:  As directed   No heavy lifting for 6 weeks     Sexual Activity Restrictions    Complete by:  As directed   No sexual activity for 6 weeks            Medication List    TAKE these medications        amLODipine 10 MG tablet  Commonly  known as:  NORVASC  Take 1 tablet (10 mg total) by mouth daily.     atenolol 50 MG tablet  Commonly known as:  TENORMIN  TAKE 1 TABLET (50 MG TOTAL) BY MOUTH DAILY.     brimonidine 0.2 % ophthalmic solution  Commonly known as:  ALPHAGAN  Place 1 drop into both eyes 2 (two) times daily.     cholecalciferol 400 UNITS Tabs tablet  Commonly known as:  VITAMIN D  Take 400 Units by mouth daily.     cloNIDine 0.1 MG tablet  Commonly known as:  CATAPRES  Take 1 tablet in AM and 2 tablets at bedtime     cyanocobalamin 1000 MCG/ML injection  Commonly known as:  (VITAMIN B-12)  Inject 1,000 mcg into the muscle every 30 (thirty) days.     ibuprofen 800 MG tablet  Commonly known as:  ADVIL,MOTRIN  Take 1 tablet (800 mg total) by mouth every 8 (eight) hours as needed (mild pain).     latanoprost 0.005 % ophthalmic solution  Commonly known as:  XALATAN  Place 1 drop into both eyes at bedtime.     lisinopril 40 MG tablet  Commonly known as:  PRINIVIL,ZESTRIL  Take 40 mg by mouth  daily. Takes one half in the morning and one half at night     loperamide 2 MG capsule  Commonly known as:  IMODIUM  Take 1 capsule (2 mg total) by mouth as needed for diarrhea or loose stools.     oxyCODONE-acetaminophen 5-325 MG per tablet  Commonly known as:  PERCOCET/ROXICET  Take 1-2 tablets by mouth every 4 (four) hours as needed (moderate to severe pain (when tolerating fluids)).     pravastatin 80 MG tablet  Commonly known as:  PRAVACHOL  TAKE 1 TABLET BY MOUTH ONCE A DAY     timolol 0.5 % ophthalmic solution  Commonly known as:  TIMOPTIC  Place 1 drop into both eyes 2 (two) times daily.     triamcinolone cream 0.1 %  Commonly known as:  KENALOG  Apply 1 application topically 2 (two) times daily.           Follow-up Information    Follow up with Lavonia Drafts, MD In 2 weeks.   Specialty:  Obstetrics and Gynecology   Contact information:   [place] [address] [phone]       Signed: Lavonia Drafts 02/08/2014, 5:07 PM

## 2014-02-08 NOTE — Plan of Care (Signed)
Problem: Phase I Progression Outcomes Goal: Other Phase I Outcomes/Goals Outcome: Completed/Met Date Met:  02/08/14  Problem: Phase II Progression Outcomes Goal: Pain controlled on oral analgesia Outcome: Completed/Met Date Met:  02/08/14 Goal: Afebrile, VS remain stable Outcome: Completed/Met Date Met:  02/08/14 Goal: Foley discontinued Outcome: Completed/Met Date Met:  02/08/14 Goal: Incision/dressings dry and intact Outcome: Completed/Met Date Met:  02/08/14  Problem: Discharge Progression Outcomes Goal: Hemodynamically stable Outcome: Completed/Met Date Met:  81/85/63 Goal: Complications resolved/controlled Outcome: Completed/Met Date Met:  02/08/14 Goal: Tolerating diet Outcome: Completed/Met Date Met:  02/08/14

## 2014-02-08 NOTE — Progress Notes (Signed)
Pt discharged home with husband and daughter... Discharge instructions reviewed with pt and she verbalized understanding... Condition stable... No equipment... Ambulated to car with Astrid Divine, NT.

## 2014-02-13 ENCOUNTER — Encounter: Payer: Self-pay | Admitting: Family Medicine

## 2014-02-13 ENCOUNTER — Ambulatory Visit (INDEPENDENT_AMBULATORY_CARE_PROVIDER_SITE_OTHER): Payer: Medicare Other | Admitting: Family Medicine

## 2014-02-13 VITALS — BP 150/78 | HR 76 | Temp 98.7°F | Resp 14 | Ht 63.0 in | Wt 185.0 lb

## 2014-02-13 DIAGNOSIS — I10 Essential (primary) hypertension: Secondary | ICD-10-CM | POA: Diagnosis not present

## 2014-02-13 DIAGNOSIS — K5731 Diverticulosis of large intestine without perforation or abscess with bleeding: Secondary | ICD-10-CM | POA: Diagnosis not present

## 2014-02-13 DIAGNOSIS — E538 Deficiency of other specified B group vitamins: Secondary | ICD-10-CM

## 2014-02-13 DIAGNOSIS — R102 Pelvic and perineal pain: Secondary | ICD-10-CM

## 2014-02-13 MED ORDER — ONDANSETRON HCL 4 MG PO TABS: 4.0000 mg | ORAL_TABLET | Freq: Three times a day (TID) | ORAL | Status: DC | PRN

## 2014-02-13 MED ORDER — CYANOCOBALAMIN 1000 MCG/ML IJ SOLN
1000.0000 ug | Freq: Once | INTRAMUSCULAR | Status: AC
  Administered 2014-02-13: 1000 ug via INTRAMUSCULAR

## 2014-02-13 NOTE — Progress Notes (Signed)
Patient ID: Debra [name], female   DOB: [birth year], 79 y.o.   MRN: [id number]   Subjective:    Patient ID: Debra [name], female    DOB: April 21, 1934, 79 y.o.   MRN: [id number]  Patient presents for 3 month F/U and Injection patient here for follow-up chronic medical problems. Since her last visit she was admitted secondary to an ovarian mass she had exploratory laparotomy however there was no cyst or mass found. She is doing fairly well status post surgery she has been at home for the past 5 days. She's using ibuprofen for pain control she is not required to Percocet but she is a little nauseous. Her bowels are moving fairly well she had loose bowels the day but has been using near last acute for bowel soft. She states that she has been referred to GI as well as urology by her GYN she is not quite sure why.  She was noted to have gallstones without cholecystitis on CT scan back in June as well as diverticulosis    Review Of Systems:  GEN- denies fatigue, fever, weight loss,weakness, recent illness HEENT- denies eye drainage, change in vision, nasal discharge, CVS- denies chest pain, palpitations RESP- denies SOB, cough, wheeze ABD- denies N/V, change in stools, +abd pain - spreness at incision GU- denies dysuria, hematuria, dribbling, incontinence MSK- denies joint pain, muscle aches, injury Neuro- denies headache, dizziness, syncope, seizure activity       Objective:    BP 150/78 mmHg  Pulse 76  Temp(Src) 98.7 F (37.1 C) (Oral)  Resp 14  Ht 5\' 3"  (1.6 m)  Wt 185 lb (83.915 kg)  BMI 32.78 kg/m2 GEN- NAD, alert and oriented x3 HEENT- PERRL, EOMI, non injected sclera, pink conjunctiva, MMM, oropharynx clear CVS- RRR, no murmur RESP-CTAB ABD-NABS,soft,NT,ND, sutures in place with steri strips, incision d/c/i EXT- No edema Pulses- Radial 2+        Assessment & Plan:      Problem List Items Addressed This Visit      Unprioritized   Vitamin B12 deficiency -  Primary   ESSENTIAL HYPERTENSION, BENIGN      Note: This dictation was prepared with Dragon dictation along with smaller phrase technology. Any transcriptional errors that result from this process are unintentional.    .

## 2014-02-13 NOTE — Assessment & Plan Note (Signed)
BP at 053 systolic is actually good for her, she is getting some edema with norvasc more than typical but does not want to d/c medication Reviewed cardiology note aldactone would be next step

## 2014-02-13 NOTE — Assessment & Plan Note (Signed)
Due for colonoscopy in 2016 based on history No current bleeding or flare of diverticulitis

## 2014-02-13 NOTE — Patient Instructions (Addendum)
B12 shot given I will call and talk to Dr. Tamala Julian  Continue current medications Hold the miralax if you have loose bowels  Nausea pill given F/U 3 months

## 2014-02-13 NOTE — Assessment & Plan Note (Signed)
I reviewed the hospital discharge I will also discuss with her GYN,  To get a just on the other referrals she mentioned today and what the next step is Ibuprofen doing well for her pain

## 2014-02-13 NOTE — Assessment & Plan Note (Signed)
B12 shot given today  

## 2014-02-16 ENCOUNTER — Telehealth: Payer: Self-pay | Admitting: Family Medicine

## 2014-02-16 MED ORDER — PROCHLORPERAZINE MALEATE 10 MG PO TABS: 10.0000 mg | ORAL_TABLET | Freq: Three times a day (TID) | ORAL | Status: DC | PRN

## 2014-02-16 NOTE — Telephone Encounter (Signed)
Unable to PA zofran, due to time Compazine sent in for nausea, covered by insurance #30 tabs Pt notified

## 2014-02-16 NOTE — Telephone Encounter (Signed)
I spoke to pt, gave her updates on why she needed to see the urologist and the GI specialist. She also was unable to get the zofran, we will call pharmacy and see why not and substitute and anti-emetic that is covered

## 2014-02-16 NOTE — Telephone Encounter (Signed)
-----   Message from Lavonia Drafts, MD sent at 02/14/2014  2:05 PM EST ----- Regarding: RE: Recent surgery/Referrals This pt had a mass noted on CT and sono but, the intra op findings were neagtive (but could have just been adhesions with trapped fluid that would appear to be a mass on imaging with no true mass).   Her bowel was firm on palpation and she has a h/o diverticulosis and constipation.  She also had sx of urinary pressure and frequency so I wanted to make sure that nothing was missed that would have otherwise caused this 'mass' on the image.  I hope this helps.   clh-S       ----- Message -----    From: Alycia Rossetti, MD    Sent: 02/14/2014   1:43 PM      To: Lavonia Drafts, MD Subject: Recent surgery/Referrals                         Hello,     I am the PCP for Mrs. Fusaro, she came in for a recheck this week. I am aware she had exploratory Lap but no cyst/mass was found. She then stated that she was being referred to GI and urology but was not sure why??   I could not discern from the hospital discharge why GI or urology were needed, can you fill me in?    - [facility]

## 2014-02-21 ENCOUNTER — Telehealth: Payer: Self-pay | Admitting: Family Medicine

## 2014-02-21 MED ORDER — LOPERAMIDE HCL 2 MG PO CAPS: 4.0000 mg | ORAL_CAPSULE | Freq: Two times a day (BID) | ORAL | Status: DC | PRN

## 2014-02-21 NOTE — Telephone Encounter (Signed)
Called and spoke with pt, she his still having some loose bowels, she took miralax 1 week ago after her surgery, has appt with GI on Monday  Has immodium at home which was prescribed will have her take 4mg  BID,  She does not have any abdominal pain, fever, nausea, states she feels well but has to wear depends because of her bowels

## 2014-02-26 ENCOUNTER — Ambulatory Visit (INDEPENDENT_AMBULATORY_CARE_PROVIDER_SITE_OTHER): Payer: Medicare Other | Admitting: Physician Assistant

## 2014-02-26 ENCOUNTER — Ambulatory Visit: Payer: Medicare Other | Admitting: Obstetrics & Gynecology

## 2014-02-26 ENCOUNTER — Encounter: Payer: Self-pay | Admitting: Physician Assistant

## 2014-02-26 VITALS — BP 150/72 | HR 76 | Ht 63.0 in | Wt 181.6 lb

## 2014-02-26 DIAGNOSIS — Z9889 Other specified postprocedural states: Secondary | ICD-10-CM

## 2014-02-26 DIAGNOSIS — R935 Abnormal findings on diagnostic imaging of other abdominal regions, including retroperitoneum: Secondary | ICD-10-CM | POA: Diagnosis not present

## 2014-02-26 DIAGNOSIS — R9389 Abnormal findings on diagnostic imaging of other specified body structures: Secondary | ICD-10-CM

## 2014-02-26 DIAGNOSIS — R938 Abnormal findings on diagnostic imaging of other specified body structures: Secondary | ICD-10-CM

## 2014-02-26 NOTE — Progress Notes (Signed)
Patient ID: Debra [name], female   DOB: May 31, 1934, 79 y.o.   MRN: [id number]   Subjective:    Patient ID: Debra [name], female    DOB: 10-28-1934, 79 y.o.   MRN: [id number]  HPI Debra [name] is a pleasant 79 year old white female referred to GI today by her gynecologist Dr. Purvis [name]. Patient is known remotely to Dr. Henrene [name] from colonoscopy done in 2006. That was done for routine screening and she was found to have one 4 mm pedunculated polyp which was adenomatous and also had diverticulosis from the ascending colon to the sigmoid colon. Patient had undergone a laparotomy on 02/06/2014 per Dr. Purvis [name] for excision of a left adnexal/ovarian cystic lesion which had been seen on both CT scan and pelvic ultrasound over the past several months. However at the time of laparotomy no cyst or adnexal mass was found. She is now being referred to urology and GI for further evaluation and to rule out neoplasm. Last CT scan of the abdomen and pelvis was done on 08/06/2013 with finding of a large gallstone a Bartholin's cyst and a 6.7 x 4.2 cm left adnexal cystic lesion versus absent and noted colonic diverticulosis. She had pelvic ultrasound done 11/24/2013 showing slight increase in size in the left ovarian lesion now measuring 6.6 x 5.9 x 3.6 cm. Sign Patient states that she's been referred to Dr. Roni [name] for urology and has an appointment in early February. She had been having some mild left-sided abdominal discomfort but says she wasn't having any ongoing left lower quadrant pain. She does have some intermittent left upper quadrant discomfort under her left rib cage. Bowel movements are finally back to normal postoperatively, she says she had gone for multiple days without a bowel movement after surgery. She has not been seeing any blood. Line She is distressed about having to go through further evaluation so soon after surgery and feels that she is still recuperating.   Review of Systems Pertinent  positive and negative review of systems were noted in the above HPI section.  All other review of systems was otherwise negative.  Outpatient Encounter Prescriptions as of 02/26/2014  Medication Sig  . amLODipine (NORVASC) 10 MG tablet Take 1 tablet (10 mg total) by mouth daily.  Marland Kitchen atenolol (TENORMIN) 50 MG tablet TAKE 1 TABLET (50 MG TOTAL) BY MOUTH DAILY.  . brimonidine (ALPHAGAN) 0.2 % ophthalmic solution Place 1 drop into both eyes 2 (two) times daily.  . cholecalciferol (VITAMIN D) 400 UNITS TABS tablet Take 400 Units by mouth daily.  . cloNIDine (CATAPRES) 0.1 MG tablet Take 1 tablet in AM and 2 tablets at bedtime  . cyanocobalamin (,VITAMIN B-12,) 1000 MCG/ML injection Inject 1,000 mcg into the muscle every 30 (thirty) days.  Marland Kitchen ibuprofen (ADVIL,MOTRIN) 800 MG tablet Take 1 tablet (800 mg total) by mouth every 8 (eight) hours as needed (mild pain).  Marland Kitchen latanoprost (XALATAN) 0.005 % ophthalmic solution Place 1 drop into both eyes at bedtime.   Marland Kitchen lisinopril (PRINIVIL,ZESTRIL) 40 MG tablet Take 40 mg by mouth daily. Takes one half in the morning and one half at night  . loperamide (IMODIUM) 2 MG capsule Take 2 capsules (4 mg total) by mouth 2 (two) times daily as needed for diarrhea or loose stools.  . ondansetron (ZOFRAN) 4 MG tablet Take 1 tablet (4 mg total) by mouth every 8 (eight) hours as needed for nausea or vomiting.  Marland Kitchen oxyCODONE-acetaminophen (PERCOCET/ROXICET) 5-325 MG per tablet Take 1-2 tablets by mouth every 4 (  four) hours as needed (moderate to severe pain (when tolerating fluids)).  . pravastatin (PRAVACHOL) 80 MG tablet TAKE 1 TABLET BY MOUTH ONCE A DAY  . prochlorperazine (COMPAZINE) 10 MG tablet Take 1 tablet (10 mg total) by mouth every 8 (eight) hours as needed for nausea or vomiting.  . timolol (TIMOPTIC) 0.5 % ophthalmic solution Place 1 drop into both eyes 2 (two) times daily.   Marland Kitchen triamcinolone cream (KENALOG) 0.1 % Apply 1 application topically 2 (two) times daily.    Allergies  Allergen Reactions  . Codeine Phosphate Nausea And Vomiting    REACTION: unspecified  . Sulfa Antibiotics     unknown  . Latex Hives, Itching and Rash   Patient Active Problem List   Diagnosis Date Noted  . Pelvic pain in female 02/06/2014  . Post-operative state 02/06/2014  . Meningioma 04/29/2012  . Vestibular schwannoma 03/26/2012  . Vertigo 03/24/2012  . UNSPECIFIED VITAMIN D DEFICIENCY 09/24/2009  . Vitamin B12 deficiency 07/05/2009  . LEUKOPENIA, MILD 10/26/2008  . OSTEOPENIA 10/16/2008  . ESSENTIAL HYPERTENSION, BENIGN 09/05/2008  . MELANOMA, HX OF 09/05/2008  . ISCHEMIC COLITIS, HX OF 05/26/2007  . HYPERCHOLESTEROLEMIA, PURE 08/20/2006  . GLAUCOMA NOS 04/21/2006  . PSVT 04/21/2006  . FEMALE STRESS INCONTINENCE 04/21/2006  . DEGENERATIVE DISC DISEASE, LUMBAR SPINE 04/21/2006  . [name] BACK PAIN, CHRONIC 04/21/2006  . PUD, HX OF 04/21/2006  . DIVERTICULOSIS, COLON W/HEM 12/01/2005   History   Social History  . Marital Status: Married    Spouse Name: N/A    Number of Children: N/A  . Years of Education: N/A   Occupational History  . raised tobacco    Social History Main Topics  . Smoking status: Never Smoker   . Smokeless tobacco: Never Used  . Alcohol Use: No  . Drug Use: No  . Sexual Activity: Not Currently    Birth Control/ Protection: None, Post-menopausal, Surgical   Other Topics Concern  . Not on file   Social History Narrative    Has living will.  Daughter is HCPOA, Debra [name].   Full code.    Debra [name]'s family history includes Alzheimer's disease in her mother; Aneurysm in her mother; Arthritis in her sister; Cancer in her father and sister; Glaucoma in her brother; Heart disease in her brother; Hypertension in her brother.      Objective:    Filed Vitals:   02/26/14 0938  BP: 150/72  Pulse: 76    Physical Exam  well-developed elderly white female in no acute distress, accompanied by her daughter vitals as above  height 5 foot 3 weight 181. HEENT ;nontraumatic, normocephalic EOMI PERRLA sclera anicteric, Supple; no JVD, Cardiovascular; regular rate and rhythm with S1-S2 no murmur or gallop, Pulmonary; clear bilaterally  Abdomen ;soft nondistended bowel sounds are active basically nontender she has a healing transverse incisional scar, Rectal; exam not done, Extremities; no clubbing cyanosis or edema skin warm and dry, Psych; mood and affect appropriate     Assessment & Plan:   #1  79 yo white female status post laparotomy on 02/06/2014 for a left ovarian/adnexal cystic lesion  With no lesion found at the time of laparotomy and therefore being referred to GI and urology for further evaluation. Patient still having incisional soreness but no other ongoing abdominal pain at present. #2 history of adenomatous colon polyp colonoscopy 2006 #3 diffuse diverticulosis #4 hypertension #5 hyperlipidemia #6 history of PSVT  Plan ; Long discussion with patient and her daughter. Will schedule for  CT scan of the abdomen and pelvis first with contrast to reassess for presence of any adnexal abnormality She will keep her urology appointment with Dr. Jeffie Pollock on February 4 We will decide about colonoscopy with Dr. Henrene [name] based on CT and urology findings.   Ilisha Blust Genia Harold PA-C 02/26/2014

## 2014-02-26 NOTE — Patient Instructions (Signed)
You have been scheduled for a CT scan of the abdomen and pelvis at [facility] ([address]---this is in the same building as Press [facility]).   You are scheduled on 03-01-2014 at 10:30 AM. You should arrive 15 minutes prior to your appointment time for registration. Please follow the written instructions below on the day of your exam:  WARNING: IF YOU ARE ALLERGIC TO IODINE/X-RAY DYE, PLEASE NOTIFY RADIOLOGY IMMEDIATELY AT [phone]! YOU WILL BE GIVEN A 13 HOUR PREMEDICATION PREP.  1) Do not eat or drink anything after 6:30 AM (4 hours prior to your test) 2) You have been given 2 bottles of oral contrast to drink. The solution may taste better if refrigerated, but do NOT add ice or any other liquid to this solution. Shake well before drinking.    Drink 1 bottle of contrast @ 8:30 AM (2 hours prior to your exam)  Drink 1 bottle of contrast @ 9:30 AM (1 hour prior to your exam)  You may take any medications as prescribed with a small amount of water except for the following: Metformin, Glucophage, Glucovance, Avandamet, Riomet, Fortamet, Actoplus Met, Janumet, Glumetza or Metaglip. The above medications must be held the day of the exam AND 48 hours after the exam.  The purpose of you drinking the oral contrast is to aid in the visualization of your intestinal tract. The contrast solution may cause some diarrhea. Before your exam is started, you will be given a small amount of fluid to drink. Depending on your individual set of symptoms, you may also receive an intravenous injection of x-ray contrast/dye. Plan on being at [facility] for 30 minutes or long, depending on the type of exam you are having performed.  This test typically takes 30-45 minutes to complete.  If you have any questions regarding your exam or if you need to reschedule, you may call the CT department at [phone] between the hours of 8:00 am and 5:00 pm,  Monday-Friday.  ________________________________________________________________________

## 2014-02-27 ENCOUNTER — Other Ambulatory Visit: Payer: Self-pay | Admitting: Family Medicine

## 2014-02-27 NOTE — Telephone Encounter (Signed)
Refill appropriate and filled per protocol. 

## 2014-02-27 NOTE — Progress Notes (Signed)
Agree with assessment and plans as outlined 

## 2014-03-01 ENCOUNTER — Ambulatory Visit (INDEPENDENT_AMBULATORY_CARE_PROVIDER_SITE_OTHER)
Admission: RE | Admit: 2014-03-01 | Discharge: 2014-03-01 | Disposition: A | Discharge status: Home / Self Care | Payer: Medicare Other | Source: Ambulatory Visit | Attending: Physician Assistant | Admitting: Physician Assistant

## 2014-03-01 DIAGNOSIS — R9389 Abnormal findings on diagnostic imaging of other specified body structures: Secondary | ICD-10-CM

## 2014-03-01 DIAGNOSIS — R938 Abnormal findings on diagnostic imaging of other specified body structures: Secondary | ICD-10-CM

## 2014-03-01 DIAGNOSIS — R935 Abnormal findings on diagnostic imaging of other abdominal regions, including retroperitoneum: Secondary | ICD-10-CM | POA: Diagnosis not present

## 2014-03-01 DIAGNOSIS — Z9889 Other specified postprocedural states: Secondary | ICD-10-CM

## 2014-03-01 DIAGNOSIS — K8020 Calculus of gallbladder without cholecystitis without obstruction: Secondary | ICD-10-CM | POA: Diagnosis not present

## 2014-03-01 DIAGNOSIS — K449 Diaphragmatic hernia without obstruction or gangrene: Secondary | ICD-10-CM | POA: Diagnosis not present

## 2014-03-01 MED ORDER — IOHEXOL 300 MG/ML  SOLN
100.0000 mL | Freq: Once | INTRAMUSCULAR | Status: AC | PRN
  Administered 2014-03-01: 100 mL via INTRAVENOUS

## 2014-03-05 ENCOUNTER — Ambulatory Visit: Payer: Medicare Other

## 2014-03-08 ENCOUNTER — Telehealth: Payer: Self-pay | Admitting: Obstetrics & Gynecology

## 2014-03-08 ENCOUNTER — Encounter: Payer: Self-pay | Admitting: Obstetrics & Gynecology

## 2014-03-08 ENCOUNTER — Ambulatory Visit: Payer: Medicare Other | Admitting: Obstetrics & Gynecology

## 2014-03-08 VITALS — BP 155/69 | HR 67 | Temp 98.3°F | Wt 178.6 lb

## 2014-03-08 DIAGNOSIS — Z9889 Other specified postprocedural states: Secondary | ICD-10-CM

## 2014-03-08 NOTE — Progress Notes (Signed)
Subjective:     Patient ID: Debra [name], female   DOB: 1935/02/13, 80 y.o.   MRN: [id number]  HPI Pt reports that she is feeling much better this week.  She is tolerating a regular diet.  She is not having significant pain.   She is back to cleaning the house without difficulty.  She was seen by her GI physician who reports that she does not need a colonoscopy at this time.  They ordered a repeat CT which 'showed that the pelvic mass was still there'.    Review of Systems     Objective:   Physical Exam BP 155/69 mmHg  Pulse 67  Temp(Src) 98.3 F (36.8 C) (Oral)  Wt 178 lb 9.6 oz (81.012 kg) Pt in NAD Abd; obese, NT, ND.  Incision: clean, dry and intact   03/01/2014 CLINICAL DATA: Followup adnexal mass.  EXAM: CT ABDOMEN AND PELVIS WITH CONTRAST  TECHNIQUE: Multidetector CT imaging of the abdomen and pelvis was performed using the standard protocol following bolus administration of intravenous contrast.  CONTRAST: 13mL OMNIPAQUE IOHEXOL 300 MG/ML SOLN  COMPARISON: 08/06/2013  FINDINGS: Lower chest: Scarring identified in the left lung base.  Hepatobiliary: Stable low-attenuation foci within the liver, likely cysts. Large stone within the gallbladder measures 2.5 cm. No biliary dilatation.  Pancreas: The pancreas appears normal.  Spleen: The spleen measures 11 cm in length.  Adrenals/Urinary Tract: Normal appearance of the adrenal glands. The right kidney is normal. The left kidney is also normal. The urinary bladder appears normal for degree of distention.  Stomach/Bowel: Small hiatal hernia noted. The small bowel loops are normal in course and caliber. The proximal colon is unremarkable. Numerous distal colonic diverticula identified without acute inflammation.  Vascular/Lymphatic: Calcified atherosclerotic disease involves the abdominal aorta. No aneurysm. No retroperitoneal or mesenteric adenopathy. There is no pelvic or inguinal  adenopathy.  Reproductive: Previous hysterectomy. The right ovary appears normal for patient's age. Again noted is a large cystic mass in the left adnexa. This measures 6.7 x 4.5 cm, image 65/series 2. Previously 6.7 x 4.3 cm. No mural nodularity or internal septation is identified in this structure which measures fluid attenuation.  Other: There is no ascites or focal fluid collections within the abdomen or pelvis. No peritoneal nodularity or mass identified.  Musculoskeletal: Review of the visualized bony structures is negative for aggressive lytic or sclerotic bone lesion. There is an anterolisthesis of L4 on L5 measuring 8 mm. L4-5 degenerative disc disease noted.  IMPRESSION: 1. No significant change in left adnexal cyst. This is almost certainly benign, but follow up ultrasound is recommended in 1 year according to the Society of Radiologists in [place] Statement (D Clovis Riley et al. Management of Asymptomatic Ovarian and Other Adnexal Cysts Imaged at [place]: Society of Radiologists in [place] Statement 2010. Radiology 256 (Sept 2010): 943-954.). 2. Small hiatal hernia. 3. Atherosclerotic disease. 4. Gallstone. 5. Lumbar spondylosis.    Assessment:     6 week post op check- doing well  CT shows that there is still a process in the LLQ although the laparotomy findings were completely negative. Will need ot review CT with a radiologist to eval discrepancy     Plan:    gradual increase in activity F/u in 3 months or sooner prn Will call pts daughter via telephone to review CT results after discussion with radiologist.

## 2014-03-08 NOTE — Patient Instructions (Signed)
Laparotomy °Care After °Refer to this sheet in the next few weeks. These instructions provide you with information on caring for yourself after your procedure. Your caregiver may also give you more specific instructions. Your treatment has been planned according to current medical practices, but problems sometimes occur. Call your caregiver if you have any problems or questions after your procedure. °HOME CARE INSTRUCTIONS °ACTIVITY °· Rest as much as possible the first two weeks at home. °· Avoid strenuous activity such as heavy lifting (more than 10 pounds), pushing, or pulling. Limit stair climbing to once or twice a day for the first week, then slowly increase this activity. °· Take frequent rest periods throughout the day. °· Talk with your caregiver about when you may resume your usual physical activity. °· You need to be out of bed and walking as much as possible. This decreases the chance of: °¨ Blood clots. °¨ Pneumonia. °NUTRITION °· You can resume your normal diet once you regain bowel function. °· Drink plenty of fluids (6-8 glasses a day or as instructed by your caregiver). °· Eat a well-balanced diet. °· Daily portions of food from the meat (protein), milk, vegetable, and bread groups are necessary for your health. °ELIMINATION °It is very important not to strain during bowel movements. If constipation should occur, you may: °· Take a mild laxative. °· Add fruit and bran to your diet. °· Drink more fluids. °HYGIENE °· Take showers, not baths, until 4-6 weeks after surgery. °· If your incision is closed, you may take a shower or tub bath. °FEVER °If you feel feverish or have shaking chills, take your temperature. If it is 102° F (38.9° C), call your caregiver. The fever may mean there is an infection. °PAIN CONTROL °· Mild discomfort may occur. °· Only take over-the-counter or prescription medicines for pain, discomfort, or fever as directed by your caregiver. Take any prescribed medicines exactly as  directed. °INCISION CARE °· Keep your incision site clean with soap and water. °· Do not use a dressing unless your cut (incision) from surgery is draining or irritated. °· If you have small adhesive strips in place and they do not fall off within 10 days, carefully peel them off. °· Check your incision and surrounding area daily for any redness, swelling, discoloration, heavy drainage, or separation of the skin. °SEXUAL INTERCOURSE °Do not have sexual intercourse until after your follow-up appointment, unless your caregiver tells you otherwise. °SEEK MEDICAL CARE IF:  °· You are unable to tolerate food or drinks. °· You are unable to pass gas or have a bowel movement. °· Your pain becomes more severe or is not relieved with medicines. °· You have redness, swelling, discoloration, heavy drainage, or separation of the skin at the incision site. °Document Released: 10/01/2003 Document Revised: 02/03/2012 Document Reviewed: 02/15/2007 °ExitCare® Patient Information ©2015 [employer]. This information is not intended to replace advice given to you by your health care provider. Make sure you discuss any questions you have with your health care provider. ° °

## 2014-03-08 NOTE — Telephone Encounter (Signed)
Erroneous encounter

## 2014-03-12 ENCOUNTER — Other Ambulatory Visit (HOSPITAL_BASED_OUTPATIENT_CLINIC_OR_DEPARTMENT_OTHER): Payer: Medicare Other

## 2014-03-12 DIAGNOSIS — E538 Deficiency of other specified B group vitamins: Secondary | ICD-10-CM

## 2014-03-12 LAB — CBC WITH DIFFERENTIAL/PLATELET
BASO%: 1.2 % (ref 0.0–2.0)
BASOS ABS: 0.1 10*3/uL (ref 0.0–0.1)
EOS%: 2.2 % (ref 0.0–7.0)
Eosinophils Absolute: 0.1 10*3/uL (ref 0.0–0.5)
HCT: 39.5 % (ref 34.8–46.6)
HEMOGLOBIN: 12.8 g/dL (ref 11.6–15.9)
LYMPH#: 1.3 10*3/uL (ref 0.9–3.3)
LYMPH%: 25.7 % (ref 14.0–49.7)
MCH: 27.9 pg (ref 25.1–34.0)
MCHC: 32.3 g/dL (ref 31.5–36.0)
MCV: 86.4 fL (ref 79.5–101.0)
MONO#: 0.3 10*3/uL (ref 0.1–0.9)
MONO%: 6.4 % (ref 0.0–14.0)
NEUT#: 3.2 10*3/uL (ref 1.5–6.5)
NEUT%: 64.5 % (ref 38.4–76.8)
PLATELETS: 292 10*3/uL (ref 145–400)
RBC: 4.58 10*6/uL (ref 3.70–5.45)
RDW: 13.5 % (ref 11.2–14.5)
WBC: 4.9 10*3/uL (ref 3.9–10.3)

## 2014-03-12 LAB — COMPREHENSIVE METABOLIC PANEL (CC13)
ALBUMIN: 3.9 g/dL (ref 3.5–5.0)
ALK PHOS: 117 U/L (ref 40–150)
ALT: 34 U/L (ref 0–55)
ANION GAP: 9 meq/L (ref 3–11)
AST: 18 U/L (ref 5–34)
BUN: 13.6 mg/dL (ref 7.0–26.0)
CO2: 27 mEq/L (ref 22–29)
CREATININE: 0.9 mg/dL (ref 0.6–1.1)
Calcium: 9.3 mg/dL (ref 8.4–10.4)
Chloride: 106 mEq/L (ref 98–109)
EGFR: 62 mL/min/{1.73_m2} — AB (ref >90)
GLUCOSE: 92 mg/dL (ref 70–140)
POTASSIUM: 4 meq/L (ref 3.5–5.1)
SODIUM: 142 meq/L (ref 136–145)
TOTAL PROTEIN: 7.3 g/dL (ref 6.4–8.3)
Total Bilirubin: 0.38 mg/dL (ref 0.20–1.20)

## 2014-03-12 LAB — VITAMIN B12: Vitamin B-12: 585 pg/mL (ref 211–911)

## 2014-03-14 ENCOUNTER — Encounter: Payer: Self-pay | Admitting: Physician Assistant

## 2014-03-15 ENCOUNTER — Ambulatory Visit (INDEPENDENT_AMBULATORY_CARE_PROVIDER_SITE_OTHER): Payer: Medicare Other | Admitting: Family Medicine

## 2014-03-15 DIAGNOSIS — E538 Deficiency of other specified B group vitamins: Secondary | ICD-10-CM | POA: Diagnosis not present

## 2014-03-15 MED ORDER — CYANOCOBALAMIN 1000 MCG/ML IJ SOLN
1000.0000 ug | Freq: Once | INTRAMUSCULAR | Status: AC
  Administered 2014-03-15: 1000 ug via INTRAMUSCULAR

## 2014-03-19 ENCOUNTER — Other Ambulatory Visit: Payer: Medicare Other

## 2014-03-19 ENCOUNTER — Ambulatory Visit (HOSPITAL_BASED_OUTPATIENT_CLINIC_OR_DEPARTMENT_OTHER): Payer: Medicare Other | Admitting: Hematology and Oncology

## 2014-03-19 DIAGNOSIS — E538 Deficiency of other specified B group vitamins: Secondary | ICD-10-CM | POA: Diagnosis not present

## 2014-03-19 DIAGNOSIS — D72819 Decreased white blood cell count, unspecified: Secondary | ICD-10-CM | POA: Diagnosis not present

## 2014-03-20 ENCOUNTER — Telehealth: Payer: Self-pay | Admitting: Hematology and Oncology

## 2014-03-20 NOTE — Telephone Encounter (Signed)
s.w. pt and advised on all appts...pt advised that she is recieving her B12 at her pcp because it is closer. I advised her on her Jan 2017 appt.Marland KitchenMarland KitchenMarland KitchenMarland Kitchenpt ok and aware

## 2014-03-20 NOTE — Assessment & Plan Note (Signed)
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 Chancy Milroy and being treated with once a month vitamin B 12 1000 g injections at least since April 2012. I reviewed recent blood counts and they appeared to be normal. Hemoglobin level and MCV are also normal. Hence at this point we decided to continue on with the same treatment and we will see her annually for follow-up.

## 2014-03-20 NOTE — Progress Notes (Signed)
Patient Care Team: Alycia Rossetti, MD as PCP - General (Family Medicine)  DIAGNOSIS: Leukopenia related to B-12 deficiency. Current treatment: Monthly B-12 injections since April 2012  CHIEF COMPLIANT: Follow-up for leukopenia  INTERVAL HISTORY: Debra [name] is a 80 year old lady with above-mentioned history of leukopenia diagnosed as related to B-12 deficiency by Dr. Humphrey Rolls and is currently being treated on monthly B-12 injections. She is doing remarkably well from the B-12 replacement standpoint. Denies any new problems or concerns.  REVIEW OF SYSTEMS:   Constitutional: Denies fevers, chills or abnormal weight loss Eyes: Denies blurriness of vision Ears, nose, mouth, throat, and face: Denies mucositis or sore throat Respiratory: Denies cough, dyspnea or wheezes Cardiovascular: Denies palpitation, chest discomfort or lower extremity swelling Gastrointestinal:  Denies nausea, heartburn or change in bowel habits Skin: Denies abnormal skin rashes Lymphatics: Denies new lymphadenopathy or easy bruising Neurological:Denies numbness, tingling or new weaknesses Behavioral/Psych: Mood is stable, no new changes  All other systems were reviewed with the patient and are negative.  I have reviewed the past medical history, past surgical history, social history and family history with the patient and they are unchanged from previous note.  ALLERGIES:  is allergic to codeine phosphate; sulfa antibiotics; and latex.  MEDICATIONS:  Current Outpatient Prescriptions  Medication Sig Dispense Refill  . amLODipine (NORVASC) 10 MG tablet Take 1 tablet (10 mg total) by mouth daily. 30 tablet 6  . atenolol (TENORMIN) 50 MG tablet TAKE 1 TABLET (50 MG TOTAL) BY MOUTH DAILY. 30 tablet 5  . brimonidine (ALPHAGAN) 0.2 % ophthalmic solution Place 1 drop into both eyes 2 (two) times daily.    . cholecalciferol (VITAMIN D) 400 UNITS TABS tablet Take 400 Units by mouth daily.    . cloNIDine (CATAPRES) 0.1  MG tablet Take 1 tablet in AM and 2 tablets at bedtime 270 tablet 1  . cyanocobalamin (,VITAMIN B-12,) 1000 MCG/ML injection Inject 1,000 mcg into the muscle every 30 (thirty) days.    Marland Kitchen ibuprofen (ADVIL,MOTRIN) 800 MG tablet Take 1 tablet (800 mg total) by mouth every 8 (eight) hours as needed (mild pain). 30 tablet 0  . latanoprost (XALATAN) 0.005 % ophthalmic solution Place 1 drop into both eyes at bedtime.     Marland Kitchen lisinopril (PRINIVIL,ZESTRIL) 40 MG tablet TAKE 1 TABLET BY MOUTH DAILY 90 tablet 1  . loperamide (IMODIUM) 2 MG capsule Take 2 capsules (4 mg total) by mouth 2 (two) times daily as needed for diarrhea or loose stools. 30 capsule 1  . ondansetron (ZOFRAN) 4 MG tablet Take 1 tablet (4 mg total) by mouth every 8 (eight) hours as needed for nausea or vomiting. 20 tablet 0  . oxyCODONE-acetaminophen (PERCOCET/ROXICET) 5-325 MG per tablet Take 1-2 tablets by mouth every 4 (four) hours as needed (moderate to severe pain (when tolerating fluids)). 30 tablet 0  . pravastatin (PRAVACHOL) 80 MG tablet TAKE 1 TABLET BY MOUTH ONCE A DAY 90 tablet 2  . prochlorperazine (COMPAZINE) 10 MG tablet Take 1 tablet (10 mg total) by mouth every 8 (eight) hours as needed for nausea or vomiting. 30 tablet 0  . timolol (TIMOPTIC) 0.5 % ophthalmic solution Place 1 drop into both eyes 2 (two) times daily.     Marland Kitchen triamcinolone cream (KENALOG) 0.1 % Apply 1 application topically 2 (two) times daily. 30 g 0   No current facility-administered medications for this visit.    PHYSICAL EXAMINATION: ECOG PERFORMANCE STATUS: 0 - Asymptomatic  Filed Vitals:   03/19/14 1611  BP: 164/65  Pulse: 68  Temp: 98.3 F (36.8 C)  Resp: 20   Filed Weights   03/19/14 1611  Weight: 180 lb 6.4 oz (81.829 kg)    GENERAL:alert, no distress and comfortable SKIN: skin color, texture, turgor are normal, no rashes or significant lesions EYES: normal, Conjunctiva are pink and non-injected, sclera clear OROPHARYNX:no exudate, no  erythema and lips, buccal mucosa, and tongue normal  NECK: supple, thyroid normal size, non-tender, without nodularity LYMPH:  no palpable lymphadenopathy in the cervical, axillary or inguinal LUNGS: clear to auscultation and percussion with normal breathing effort HEART: regular rate & rhythm and no murmurs and no lower extremity edema ABDOMEN:abdomen soft, non-tender and normal bowel sounds Musculoskeletal:no cyanosis of digits and no clubbing  NEURO: alert & oriented x 3 with fluent speech, no focal motor/sensory deficits  LABORATORY DATA:  I have reviewed the data as listed   Chemistry      Component Value Date/Time   NA 142 03/12/2014 1604   NA 140 02/01/2014 1030   NA 140 06/04/2009 1057   K 4.0 03/12/2014 1604   K 4.4 02/01/2014 1030   K 4.2 06/04/2009 1057   CL 103 02/01/2014 1030   CL 103 06/04/2009 1057   CO2 27 03/12/2014 1604   CO2 27 02/01/2014 1030   CO2 29 06/04/2009 1057   BUN 13.6 03/12/2014 1604   BUN 13 02/01/2014 1030   BUN 17 06/04/2009 1057   CREATININE 0.9 03/12/2014 1604   CREATININE 0.77 02/01/2014 1030   CREATININE 0.6 06/04/2009 1057      Component Value Date/Time   CALCIUM 9.3 03/12/2014 1604   CALCIUM 9.4 02/01/2014 1030   CALCIUM 9.7 06/04/2009 1057   ALKPHOS 117 03/12/2014 1604   ALKPHOS 60 07/07/2012 1027   ALKPHOS 101* 06/04/2009 1057   AST 18 03/12/2014 1604   AST 20 07/07/2012 1027   AST 36 06/04/2009 1057   ALT 34 03/12/2014 1604   ALT 13 07/07/2012 1027   ALT 54* 06/04/2009 1057   BILITOT 0.38 03/12/2014 1604   BILITOT 0.7 07/07/2012 1027   BILITOT 0.50 06/04/2009 1057       Lab Results  Component Value Date   WBC 4.9 03/12/2014   HGB 12.8 03/12/2014   HCT 39.5 03/12/2014   MCV 86.4 03/12/2014   PLT 292 03/12/2014   NEUTROABS 3.2 03/12/2014   ASSESSMENT & PLAN:  Vitamin B12 deficiency Leukopenia associated with B-12 deficiency diagnosed by Dr. Chancy Milroy and being treated with once a month vitamin B 12 1000 g injections at  least since April 2012. I reviewed recent blood counts and they appeared to be normal. Hemoglobin level and MCV are also normal. Hence at this point we decided to continue on with the same treatment and we will see her annually for follow-up.    Orders Placed This Encounter  Procedures  . CBC with Differential    Standing Status: Future     Number of Occurrences:      Standing Expiration Date: 03/19/2015  . Comprehensive metabolic panel (Cmet) - [facility]    Standing Status: Future     Number of Occurrences:      Standing Expiration Date: 03/19/2015   The patient has a good understanding of the overall plan. she agrees with it. She will call with any problems that may develop before her next visit here.   Rulon Eisenmenger, MD

## 2014-03-30 ENCOUNTER — Other Ambulatory Visit: Payer: Self-pay | Admitting: Family Medicine

## 2014-03-30 NOTE — Telephone Encounter (Signed)
Medication refilled per protocol. 

## 2014-04-02 DIAGNOSIS — H4011X2 Primary open-angle glaucoma, moderate stage: Secondary | ICD-10-CM | POA: Diagnosis not present

## 2014-04-04 DIAGNOSIS — R312 Other microscopic hematuria: Secondary | ICD-10-CM | POA: Diagnosis not present

## 2014-04-04 DIAGNOSIS — R350 Frequency of micturition: Secondary | ICD-10-CM | POA: Diagnosis not present

## 2014-04-04 DIAGNOSIS — N3946 Mixed incontinence: Secondary | ICD-10-CM | POA: Diagnosis not present

## 2014-04-12 ENCOUNTER — Ambulatory Visit: Payer: Medicare Other

## 2014-04-17 ENCOUNTER — Ambulatory Visit (INDEPENDENT_AMBULATORY_CARE_PROVIDER_SITE_OTHER): Payer: Medicare Other | Admitting: Family Medicine

## 2014-04-17 DIAGNOSIS — E538 Deficiency of other specified B group vitamins: Secondary | ICD-10-CM

## 2014-04-17 MED ORDER — CYANOCOBALAMIN 1000 MCG/ML IJ SOLN
1000.0000 ug | Freq: Once | INTRAMUSCULAR | Status: AC
  Administered 2014-04-17: 1000 ug via INTRAMUSCULAR

## 2014-05-03 ENCOUNTER — Ambulatory Visit: Payer: Medicare Other

## 2014-05-03 DIAGNOSIS — R350 Frequency of micturition: Secondary | ICD-10-CM | POA: Diagnosis not present

## 2014-05-03 DIAGNOSIS — N3946 Mixed incontinence: Secondary | ICD-10-CM | POA: Diagnosis not present

## 2014-05-03 DIAGNOSIS — B9620 Unspecified Escherichia coli [E. coli] as the cause of diseases classified elsewhere: Secondary | ICD-10-CM | POA: Diagnosis not present

## 2014-05-03 DIAGNOSIS — N390 Urinary tract infection, site not specified: Secondary | ICD-10-CM | POA: Diagnosis not present

## 2014-05-16 ENCOUNTER — Ambulatory Visit (INDEPENDENT_AMBULATORY_CARE_PROVIDER_SITE_OTHER): Payer: Medicare Other | Admitting: Family Medicine

## 2014-05-16 DIAGNOSIS — E538 Deficiency of other specified B group vitamins: Secondary | ICD-10-CM | POA: Diagnosis not present

## 2014-05-16 MED ORDER — CYANOCOBALAMIN 1000 MCG/ML IJ SOLN
1000.0000 ug | Freq: Once | INTRAMUSCULAR | Status: AC
  Administered 2014-05-16: 1000 ug via INTRAMUSCULAR

## 2014-05-22 ENCOUNTER — Ambulatory Visit (INDEPENDENT_AMBULATORY_CARE_PROVIDER_SITE_OTHER): Payer: Medicare Other | Admitting: Family Medicine

## 2014-05-22 ENCOUNTER — Encounter: Payer: Self-pay | Admitting: Family Medicine

## 2014-05-22 VITALS — BP 168/80 | HR 84 | Temp 98.0°F | Resp 18 | Ht 63.0 in | Wt 180.0 lb

## 2014-05-22 DIAGNOSIS — E538 Deficiency of other specified B group vitamins: Secondary | ICD-10-CM

## 2014-05-22 DIAGNOSIS — E7800 Pure hypercholesterolemia, unspecified: Secondary | ICD-10-CM

## 2014-05-22 DIAGNOSIS — I10 Essential (primary) hypertension: Secondary | ICD-10-CM

## 2014-05-22 DIAGNOSIS — R609 Edema, unspecified: Secondary | ICD-10-CM | POA: Diagnosis not present

## 2014-05-22 DIAGNOSIS — R600 Localized edema: Secondary | ICD-10-CM

## 2014-05-22 MED ORDER — FUROSEMIDE 20 MG PO TABS: 20.0000 mg | ORAL_TABLET | Freq: Every day | ORAL | Status: DC | PRN

## 2014-05-22 NOTE — Progress Notes (Signed)
Patient ID: Debra [name], female   DOB: May 10, 1934, 80 y.o.   MRN: [id number]   Subjective:    Patient ID: Debra [name], female    DOB: 09-Nov-1934, 80 y.o.   MRN: [id number]  Patient presents for 3 month F/U  patient here to follow chronic medical problems. We discussed her recent visit with her urologist she was started on thoracic care for overactive bladder he also recommended surveillance for the benign cyst on her left ovary.  She has had increased swelling in her feet over the past month or so she states that they go down of her feet are elevated on first thing in the morning but throughout the day they continued to swell more and they're getting to the point where they're painful to put her shoes on. She denies any chest pain or shortness of breath associated. She has been taking all of her medications as prescribed for her blood pressure but is still been running quite high.    Review Of Systems:  GEN- denies fatigue, fever, weight loss,weakness, recent illness HEENT- denies eye drainage, change in vision, nasal discharge, CVS- denies chest pain, palpitations RESP- denies SOB, cough, wheeze ABD- denies N/V, change in stools, abd pain GU- denies dysuria, hematuria, dribbling, incontinence MSK- + joint pain, muscle aches, injury Neuro- denies headache, dizziness, syncope, seizure activity       Objective:    BP 168/80 mmHg  Pulse 84  Temp(Src) 98 F (36.7 C) (Oral)  Resp 18  Ht 5\' 3"  (1.6 m)  Wt 180 lb (81.647 kg)  BMI 31.89 kg/m2 GEN- NAD, alert and oriented x3 HEENT- PERRL, EOMI, non injected sclera, pink conjunctiva, MMM, oropharynx clear Neck- Supple, no JVD CVS- RRR, no murmur RESP-CTAB ABD-NABS,soft,NT,ND EXT- 1+ edema to mid shins Pulses- Radial 2+ DP- 1+        Assessment & Plan:      Problem List Items Addressed This Visit      Unprioritized   Peripheral edema   HYPERCHOLESTEROLEMIA, PURE   Relevant Medications   furosemide (LASIX)  tablet   ESSENTIAL HYPERTENSION, BENIGN - Primary   Relevant Medications   furosemide (LASIX) tablet      Note: This dictation was prepared with Dragon dictation along with smaller phrase technology. Any transcriptional errors that result from this process are unintentional.

## 2014-05-22 NOTE — Assessment & Plan Note (Signed)
Her blood pressure is all it been very difficult to control. She also now has some fluid retention. I would try her on clonidine 0.2 mg twice a day she is currently on point 1 in the morning and 0.2 mg at bedtime she will continue her other medications. I'm also going to treat her for a few days with a diuretic to see how she does with this

## 2014-05-22 NOTE — Assessment & Plan Note (Signed)
Currently on pravastatin 80 mg she will return for fasting labs

## 2014-05-22 NOTE — Patient Instructions (Addendum)
Take the lasix 20mg  once a day in the morning for 5 days, for Fluid  Stop the vesicare right now  Take 2 clonidine in the morning and 2 in the evening for blood pressure Keep all other medications the same Come in for fasting labs on Thursday morning  Use Compression socks you ordered F/U  On Monday at  2:30 pm  for leg swelling

## 2014-05-22 NOTE — Assessment & Plan Note (Signed)
Peripheral edema noted with treat her with Lasix 20 mg once a day as well as elevating her feet and she has some compression hose at home. She will take the Lasix for the next 5 days I will recheck her on Monday there is no sign of any cardiorespiratory distress She has already had a normal echocardiogram done in October 2015 before she had her surgery

## 2014-05-22 NOTE — Assessment & Plan Note (Signed)
Continue injections monthly, recent labs normal

## 2014-05-23 ENCOUNTER — Encounter: Payer: Self-pay | Admitting: *Deleted

## 2014-05-24 ENCOUNTER — Other Ambulatory Visit: Payer: Medicare Other

## 2014-05-24 DIAGNOSIS — E7800 Pure hypercholesterolemia, unspecified: Secondary | ICD-10-CM

## 2014-05-24 DIAGNOSIS — I10 Essential (primary) hypertension: Secondary | ICD-10-CM

## 2014-05-24 LAB — COMPREHENSIVE METABOLIC PANEL
ALT: 13 U/L (ref 0–35)
AST: 16 U/L (ref 0–37)
Albumin: 4.3 g/dL (ref 3.5–5.2)
Alkaline Phosphatase: 63 U/L (ref 39–117)
BUN: 14 mg/dL (ref 6–23)
CALCIUM: 9.1 mg/dL (ref 8.4–10.5)
CHLORIDE: 105 meq/L (ref 96–112)
CO2: 24 mEq/L (ref 19–32)
Creat: 0.81 mg/dL (ref 0.50–1.10)
GLUCOSE: 89 mg/dL (ref 70–99)
POTASSIUM: 4 meq/L (ref 3.5–5.3)
Sodium: 141 mEq/L (ref 135–145)
Total Bilirubin: 0.6 mg/dL (ref 0.2–1.2)
Total Protein: 6.6 g/dL (ref 6.0–8.3)

## 2014-05-24 LAB — LIPID PANEL
CHOL/HDL RATIO: 4.9 ratio
Cholesterol: 196 mg/dL (ref 0–200)
HDL: 40 mg/dL — AB (ref >=46)
LDL Cholesterol: 130 mg/dL — ABNORMAL HIGH (ref 0–99)
Triglycerides: 130 mg/dL (ref <150)
VLDL: 26 mg/dL (ref 0–40)

## 2014-05-24 LAB — CBC WITH DIFFERENTIAL/PLATELET
BASOS ABS: 0 10*3/uL (ref 0.0–0.1)
BASOS PCT: 1 % (ref 0–1)
EOS ABS: 0.1 10*3/uL (ref 0.0–0.7)
Eosinophils Relative: 4 % (ref 0–5)
HEMATOCRIT: 42.2 % (ref 36.0–46.0)
Hemoglobin: 14.2 g/dL (ref 12.0–15.0)
Lymphocytes Relative: 29 % (ref 12–46)
Lymphs Abs: 0.9 10*3/uL (ref 0.7–4.0)
MCH: 28.6 pg (ref 26.0–34.0)
MCHC: 33.6 g/dL (ref 30.0–36.0)
MCV: 85.1 fL (ref 78.0–100.0)
MONOS PCT: 6 % (ref 3–12)
MPV: 9 fL (ref 8.6–12.4)
Monocytes Absolute: 0.2 10*3/uL (ref 0.1–1.0)
NEUTROS ABS: 1.9 10*3/uL (ref 1.7–7.7)
Neutrophils Relative %: 60 % (ref 43–77)
PLATELETS: 148 10*3/uL — AB (ref 150–400)
RBC: 4.96 MIL/uL (ref 3.87–5.11)
RDW: 14.6 % (ref 11.5–15.5)
WBC: 3.1 10*3/uL — AB (ref 4.0–10.5)

## 2014-05-28 ENCOUNTER — Encounter: Payer: Self-pay | Admitting: Family Medicine

## 2014-05-28 ENCOUNTER — Ambulatory Visit (INDEPENDENT_AMBULATORY_CARE_PROVIDER_SITE_OTHER): Payer: Medicare Other | Admitting: Family Medicine

## 2014-05-28 VITALS — BP 150/84 | HR 76 | Temp 98.5°F | Resp 18 | Ht 63.0 in | Wt 178.0 lb

## 2014-05-28 DIAGNOSIS — R609 Edema, unspecified: Secondary | ICD-10-CM | POA: Diagnosis not present

## 2014-05-28 DIAGNOSIS — E7800 Pure hypercholesterolemia, unspecified: Secondary | ICD-10-CM

## 2014-05-28 DIAGNOSIS — I951 Orthostatic hypotension: Secondary | ICD-10-CM

## 2014-05-28 DIAGNOSIS — R600 Localized edema: Secondary | ICD-10-CM

## 2014-05-28 NOTE — Assessment & Plan Note (Signed)
Leg swelling has improved though still present. I will have her continue with the Lasix along with the compression hose. She will keep her regular how often she is needing this if I find that she is using on a regular basis we will also add potassium to this. I am rechecking her renal function and potassium level today

## 2014-05-28 NOTE — Patient Instructions (Signed)
Go back to regular dose of clonidine 1 in the morning and 2 in the evening Lasix 1 tablet as needed once a day  Use the compression hose once a day  Continue to sip water Biotin for dry mouth

## 2014-05-28 NOTE — Assessment & Plan Note (Signed)
Her cholesterol is much improved. She's not tolerate things such as Lipitor and Crestor therefore she will continue the pravastatin 80 mg

## 2014-05-28 NOTE — Progress Notes (Signed)
Patient ID: Debra [name], female   DOB: 1935/02/16, 80 y.o.   MRN: [id number]   Subjective:    Patient ID: Debra [name], female    DOB: 1934/11/21, 80 y.o.   MRN: [id number]  Patient presents for 1 week F/U and Dizziness  patient here for interim follow-up. Her leg swelling has gone down however she was very dizzy throughout the weekend. At her last visit I increased her clonidine to 0.2 mg twice a day and she was also given Lasix 20 mg for 5 days. She states that she was drinking and trying to keep up with her fluids, but she felt a little weak. She has also been using compression hose which has helped    Review Of Systems:  GEN- denies fatigue, fever, weight loss,weakness, recent illness HEENT- denies eye drainage, change in vision, nasal discharge, CVS- denies chest pain, palpitations RESP- denies SOB, cough, wheeze MSK- denies joint pain, muscle aches, injury Neuro- denies headache, +dizziness, syncope, seizure activity       Objective:    BP 150/84 mmHg  Pulse 76  Temp(Src) 98.5 F (36.9 C) (Oral)  Resp 18  Ht 5\' 3"  (1.6 m)  Wt 178 lb (80.74 kg)  BMI 31.54 kg/m2 GEN- NAD, alert and oriented x3 HEENT- PERRL, EOMI, non injected sclera, pink conjunctiva, MMM, oropharynx clear CVS- RRR, no murmur RESP-CTAB Neuro- CNII-XII intact, gait at baseline, no focal deficits EXT- + edema to mid shins ( improved, mild erythema bilat, no warmth) Pulses- Radial 2+ DP- 1+   Lying 152/78,  shit 140/ 68,  standing138/ 66     Assessment & Plan:      Problem List Items Addressed This Visit      Unprioritized   Orthostatic hypotension - Primary   Relevant Orders   Basic metabolic panel      Note: This dictation was prepared with Dragon dictation along with smaller phrase technology. Any transcriptional errors that result from this process are unintentional.

## 2014-05-28 NOTE — Assessment & Plan Note (Signed)
Although the blood pressure readings that we're getting today are much improved and more the normal range when she stands up she does have about a 10 point drop from lying different thing she was getting some orthostasis from the change in the blood pressure medication when I increased her clonidine up to 0.2 mg the morning and I also gave her low-dose diuretic. I will take her clonidine back to her regular dose of point 1 in the morning and 0.2 at bedtime and she tolerated this without difficulty. She will need to use the diuretic for the next few days as well as on an as-needed basis therefore will just let her blood pressure run higher.

## 2014-05-29 LAB — BASIC METABOLIC PANEL
BUN: 15 mg/dL (ref 6–23)
CALCIUM: 9.7 mg/dL (ref 8.4–10.5)
CO2: 26 mEq/L (ref 19–32)
CREATININE: 0.9 mg/dL (ref 0.50–1.10)
Chloride: 103 mEq/L (ref 96–112)
Glucose, Bld: 98 mg/dL (ref 70–99)
Potassium: 3.9 mEq/L (ref 3.5–5.3)
Sodium: 140 mEq/L (ref 135–145)

## 2014-05-31 ENCOUNTER — Ambulatory Visit: Payer: Medicare Other

## 2014-06-18 ENCOUNTER — Ambulatory Visit (INDEPENDENT_AMBULATORY_CARE_PROVIDER_SITE_OTHER): Payer: Medicare Other | Admitting: *Deleted

## 2014-06-18 DIAGNOSIS — E538 Deficiency of other specified B group vitamins: Secondary | ICD-10-CM | POA: Diagnosis not present

## 2014-06-18 MED ORDER — CYANOCOBALAMIN 1000 MCG/ML IJ SOLN
1000.0000 ug | Freq: Once | INTRAMUSCULAR | Status: AC
  Administered 2014-06-18: 1000 ug via INTRAMUSCULAR

## 2014-06-28 ENCOUNTER — Ambulatory Visit: Payer: Medicare Other

## 2014-07-03 ENCOUNTER — Other Ambulatory Visit: Payer: Self-pay | Admitting: Interventional Cardiology

## 2014-07-19 ENCOUNTER — Ambulatory Visit (INDEPENDENT_AMBULATORY_CARE_PROVIDER_SITE_OTHER): Payer: Medicare Other | Admitting: *Deleted

## 2014-07-19 DIAGNOSIS — E538 Deficiency of other specified B group vitamins: Secondary | ICD-10-CM | POA: Diagnosis not present

## 2014-07-19 MED ORDER — CYANOCOBALAMIN 1000 MCG/ML IJ SOLN
1000.0000 ug | Freq: Once | INTRAMUSCULAR | Status: AC
  Administered 2014-07-19: 1000 ug via INTRAMUSCULAR

## 2014-07-19 NOTE — Progress Notes (Signed)
Patient ID: Debra [name], female   DOB: [birth year], 80 y.o.   MRN: [id number] Patient seen in office for Vitamin B12 injection.   Tolerated IM administration well.

## 2014-07-19 NOTE — Patient Instructions (Signed)
Return in 30 days for the next Vitamin B 12 injection.

## 2014-07-26 ENCOUNTER — Ambulatory Visit: Payer: Medicare Other

## 2014-07-27 ENCOUNTER — Other Ambulatory Visit: Payer: Medicare Other

## 2014-08-20 ENCOUNTER — Ambulatory Visit (INDEPENDENT_AMBULATORY_CARE_PROVIDER_SITE_OTHER): Payer: Medicare Other | Admitting: *Deleted

## 2014-08-20 DIAGNOSIS — E539 Vitamin B deficiency, unspecified: Secondary | ICD-10-CM

## 2014-08-20 MED ORDER — CYANOCOBALAMIN 1000 MCG/ML IJ SOLN
1000.0000 ug | Freq: Once | INTRAMUSCULAR | Status: AC
  Administered 2014-08-20: 1000 ug via INTRAMUSCULAR

## 2014-08-23 ENCOUNTER — Other Ambulatory Visit: Payer: Self-pay | Admitting: Family Medicine

## 2014-08-23 ENCOUNTER — Ambulatory Visit: Payer: Medicare Other

## 2014-08-23 NOTE — Telephone Encounter (Signed)
Medication refilled per protocol. 

## 2014-09-04 DIAGNOSIS — H4011X2 Primary open-angle glaucoma, moderate stage: Secondary | ICD-10-CM | POA: Diagnosis not present

## 2014-09-19 ENCOUNTER — Ambulatory Visit (INDEPENDENT_AMBULATORY_CARE_PROVIDER_SITE_OTHER): Payer: Medicare Other | Admitting: Family Medicine

## 2014-09-19 DIAGNOSIS — E538 Deficiency of other specified B group vitamins: Secondary | ICD-10-CM | POA: Diagnosis not present

## 2014-09-19 MED ORDER — CYANOCOBALAMIN 1000 MCG/ML IJ SOLN
1000.0000 ug | Freq: Once | INTRAMUSCULAR | Status: AC
  Administered 2014-09-19: 1000 ug via INTRAMUSCULAR

## 2014-09-20 ENCOUNTER — Ambulatory Visit: Payer: Medicare Other

## 2014-10-05 ENCOUNTER — Encounter: Payer: Self-pay | Admitting: Family Medicine

## 2014-10-05 ENCOUNTER — Ambulatory Visit (INDEPENDENT_AMBULATORY_CARE_PROVIDER_SITE_OTHER): Payer: Medicare Other | Admitting: Family Medicine

## 2014-10-05 VITALS — BP 140/88 | HR 82 | Temp 98.1°F | Resp 14 | Ht 63.0 in | Wt 182.0 lb

## 2014-10-05 DIAGNOSIS — E538 Deficiency of other specified B group vitamins: Secondary | ICD-10-CM | POA: Diagnosis not present

## 2014-10-05 DIAGNOSIS — E7800 Pure hypercholesterolemia, unspecified: Secondary | ICD-10-CM

## 2014-10-05 DIAGNOSIS — R609 Edema, unspecified: Secondary | ICD-10-CM

## 2014-10-05 DIAGNOSIS — I10 Essential (primary) hypertension: Secondary | ICD-10-CM | POA: Diagnosis not present

## 2014-10-05 LAB — CBC WITH DIFFERENTIAL/PLATELET
Basophils Absolute: 0 10*3/uL (ref 0.0–0.1)
Basophils Relative: 1 % (ref 0–1)
EOS ABS: 0.1 10*3/uL (ref 0.0–0.7)
EOS PCT: 4 % (ref 0–5)
HCT: 39.9 % (ref 36.0–46.0)
HEMOGLOBIN: 13.8 g/dL (ref 12.0–15.0)
Lymphocytes Relative: 29 % (ref 12–46)
Lymphs Abs: 1 10*3/uL (ref 0.7–4.0)
MCH: 29.1 pg (ref 26.0–34.0)
MCHC: 34.6 g/dL (ref 30.0–36.0)
MCV: 84 fL (ref 78.0–100.0)
MPV: 9.1 fL (ref 8.6–12.4)
Monocytes Absolute: 0.3 10*3/uL (ref 0.1–1.0)
Monocytes Relative: 8 % (ref 3–12)
Neutro Abs: 2 10*3/uL (ref 1.7–7.7)
Neutrophils Relative %: 58 % (ref 43–77)
Platelets: 158 10*3/uL (ref 150–400)
RBC: 4.75 MIL/uL (ref 3.87–5.11)
RDW: 14.2 % (ref 11.5–15.5)
WBC: 3.5 10*3/uL — ABNORMAL LOW (ref 4.0–10.5)

## 2014-10-05 LAB — VITAMIN B12: Vitamin B-12: 384 pg/mL (ref 211–911)

## 2014-10-05 LAB — BASIC METABOLIC PANEL
BUN: 13 mg/dL (ref 7–25)
CALCIUM: 9.2 mg/dL (ref 8.6–10.4)
CHLORIDE: 107 mmol/L (ref 98–110)
CO2: 26 mmol/L (ref 20–31)
Creat: 0.78 mg/dL (ref 0.60–0.93)
GLUCOSE: 83 mg/dL (ref 70–99)
POTASSIUM: 4.1 mmol/L (ref 3.5–5.3)
SODIUM: 141 mmol/L (ref 135–146)

## 2014-10-05 MED ORDER — ATENOLOL 50 MG PO TABS: 50.0000 mg | ORAL_TABLET | Freq: Every day | ORAL | Status: DC

## 2014-10-05 MED ORDER — POTASSIUM CHLORIDE CRYS ER 20 MEQ PO TBCR: EXTENDED_RELEASE_TABLET | ORAL | Status: DC

## 2014-10-05 MED ORDER — FUROSEMIDE 40 MG PO TABS: 40.0000 mg | ORAL_TABLET | Freq: Every day | ORAL | Status: DC | PRN

## 2014-10-05 NOTE — Progress Notes (Signed)
Patient ID: Debra [name], female   DOB: 04-03-1934, 80 y.o.   MRN: [id number]   Subjective:    Patient ID: Debra [name], female    DOB: [birth year], 80 y.o.   MRN: [id number]  Patient presents for 3 month F/U and BLE Edema  patient to follow-up chronic medical problems. Her only concern is bilateral leg swelling which has been persistent. He has done in the morning but as the day progresses she has more swelling. She has been taking Lasix 20 mg but this only helps minimally. She is also using compression hose however when it is very hot she does not wear them. She denies any chest pain or shortness of breath. She has been up on her feet more as they were doing some renovations to the home and she had actually been helping to pain. The pain didn't result in some lower back pain and left hip pain that resolved with the use of Tylenol and rest. She was seen by her ophthalmologist recently there was no changes to her glaucoma medications. She is overdue to see her neurosurgeon with regards to the meningioma last MRI was done 2 years ago  Review Of Systems:  GEN- denies fatigue, fever, weight loss,weakness, recent illness HEENT- denies eye drainage, change in vision, nasal discharge, CVS- denies chest pain, palpitations RESP- denies SOB, cough, wheeze ABD- denies N/V, change in stools, abd pain GU- denies dysuria, hematuria, dribbling, incontinence MSK- +joint pain, muscle aches, injury Neuro- denies headache, dizziness, syncope, seizure activity       Objective:    BP 140/88 mmHg  Pulse 82  Temp(Src) 98.1 F (36.7 C) (Oral)  Resp 14  Ht 5\' 3"  (1.6 m)  Wt 182 lb (82.555 kg)  BMI 32.25 kg/m2 GEN- NAD, alert and oriented x3 HEENT- PERRL, EOMI, non injected sclera, pink conjunctiva, MMM, oropharynx clear Neck- Supple, no JVD CVS- RRR, no murmur RESP-CTAB ABD-NABS,soft,NT,ND EXT-1+ pitting edema bilat Pulses- Radial - 2+, DP 1+        Assessment & Plan:      Problem  List Items Addressed This Visit    Vitamin B12 deficiency - Primary   Relevant Orders   Vitamin B12   HYPERCHOLESTEROLEMIA, PURE   ESSENTIAL HYPERTENSION, BENIGN   Relevant Orders   Basic metabolic panel   CBC with Differential/Platelet      Note: This dictation was prepared with Dragon dictation along with smaller phrase technology. Any transcriptional errors that result from this process are unintentional.

## 2014-10-05 NOTE — Assessment & Plan Note (Signed)
RecheckB12 level currently on monthly injections and doing well

## 2014-10-05 NOTE — Patient Instructions (Addendum)
We will get follow-up Dr. Trenton Gammon for Menigioma Lasix increase to 40mg  ( take 2 of your 20mg  tablets first), take with potassium to prevent leg cramps Blood pressure looks good Wear your compression hose  F/U 4 months

## 2014-10-05 NOTE — Assessment & Plan Note (Addendum)
persistant edema, at age ,will try more symptomatic treatment, increase lasix to 40mg  take with potassium to avoid cramping Continue compression hose as tolerated No significant Heart failure on Echo from 2015

## 2014-10-05 NOTE — Assessment & Plan Note (Signed)
Blood pressure looks good for her, continue current medications

## 2014-10-18 ENCOUNTER — Ambulatory Visit: Payer: Medicare Other

## 2014-10-22 ENCOUNTER — Ambulatory Visit (INDEPENDENT_AMBULATORY_CARE_PROVIDER_SITE_OTHER): Payer: Medicare Other | Admitting: *Deleted

## 2014-10-22 DIAGNOSIS — E538 Deficiency of other specified B group vitamins: Secondary | ICD-10-CM | POA: Diagnosis not present

## 2014-10-22 MED ORDER — CYANOCOBALAMIN 1000 MCG/ML IJ SOLN
1000.0000 ug | Freq: Once | INTRAMUSCULAR | Status: AC
  Administered 2014-10-22: 1000 ug via INTRAMUSCULAR

## 2014-11-15 ENCOUNTER — Ambulatory Visit: Payer: Medicare Other

## 2014-11-19 ENCOUNTER — Other Ambulatory Visit: Payer: Self-pay | Admitting: Family Medicine

## 2014-11-19 NOTE — Telephone Encounter (Signed)
Refill appropriate and filled per protocol. 

## 2014-11-23 ENCOUNTER — Ambulatory Visit (INDEPENDENT_AMBULATORY_CARE_PROVIDER_SITE_OTHER): Payer: Medicare Other | Admitting: *Deleted

## 2014-11-23 DIAGNOSIS — Z23 Encounter for immunization: Secondary | ICD-10-CM

## 2014-11-23 DIAGNOSIS — E538 Deficiency of other specified B group vitamins: Secondary | ICD-10-CM | POA: Diagnosis not present

## 2014-11-23 MED ORDER — CYANOCOBALAMIN 1000 MCG/ML IJ SOLN
1000.0000 ug | Freq: Once | INTRAMUSCULAR | Status: AC
  Administered 2014-11-23: 1000 ug via INTRAMUSCULAR

## 2014-11-23 NOTE — Progress Notes (Signed)
Patient ID: Debra [name], female   DOB: 12-Nov-1934, 80 y.o.   MRN: [id number]  Patient seen in office for Influenza Vaccination and administration of Vitamin B12.  Tolerated IM administration well.   Immunization history updated.

## 2014-12-13 ENCOUNTER — Ambulatory Visit: Payer: Medicare Other

## 2014-12-21 ENCOUNTER — Ambulatory Visit (INDEPENDENT_AMBULATORY_CARE_PROVIDER_SITE_OTHER): Payer: Medicare Other | Admitting: *Deleted

## 2014-12-21 DIAGNOSIS — E538 Deficiency of other specified B group vitamins: Secondary | ICD-10-CM | POA: Diagnosis not present

## 2014-12-21 MED ORDER — CYANOCOBALAMIN 1000 MCG/ML IJ SOLN
1000.0000 ug | Freq: Once | INTRAMUSCULAR | Status: AC
  Administered 2014-12-21: 1000 ug via INTRAMUSCULAR

## 2014-12-24 ENCOUNTER — Other Ambulatory Visit: Payer: Self-pay | Admitting: Interventional Cardiology

## 2015-01-10 ENCOUNTER — Ambulatory Visit: Payer: Medicare Other

## 2015-01-21 ENCOUNTER — Ambulatory Visit (INDEPENDENT_AMBULATORY_CARE_PROVIDER_SITE_OTHER): Payer: Medicare Other | Admitting: Family Medicine

## 2015-01-21 DIAGNOSIS — E538 Deficiency of other specified B group vitamins: Secondary | ICD-10-CM | POA: Diagnosis not present

## 2015-01-21 MED ORDER — CYANOCOBALAMIN 1000 MCG/ML IJ SOLN
1000.0000 ug | Freq: Once | INTRAMUSCULAR | Status: AC
  Administered 2015-01-21: 1000 ug via INTRAMUSCULAR

## 2015-02-04 ENCOUNTER — Encounter: Payer: Self-pay | Admitting: Family Medicine

## 2015-02-04 ENCOUNTER — Ambulatory Visit (INDEPENDENT_AMBULATORY_CARE_PROVIDER_SITE_OTHER): Payer: Medicare Other | Admitting: Family Medicine

## 2015-02-04 VITALS — BP 130/74 | HR 68 | Temp 98.8°F | Resp 16 | Ht 63.0 in | Wt 177.0 lb

## 2015-02-04 DIAGNOSIS — M1711 Unilateral primary osteoarthritis, right knee: Secondary | ICD-10-CM | POA: Diagnosis not present

## 2015-02-04 DIAGNOSIS — M5137 Other intervertebral disc degeneration, lumbosacral region: Secondary | ICD-10-CM | POA: Diagnosis not present

## 2015-02-04 DIAGNOSIS — E559 Vitamin D deficiency, unspecified: Secondary | ICD-10-CM | POA: Diagnosis not present

## 2015-02-04 DIAGNOSIS — E538 Deficiency of other specified B group vitamins: Secondary | ICD-10-CM | POA: Diagnosis not present

## 2015-02-04 DIAGNOSIS — E7800 Pure hypercholesterolemia, unspecified: Secondary | ICD-10-CM | POA: Diagnosis not present

## 2015-02-04 DIAGNOSIS — I10 Essential (primary) hypertension: Secondary | ICD-10-CM

## 2015-02-04 DIAGNOSIS — A084 Viral intestinal infection, unspecified: Secondary | ICD-10-CM | POA: Diagnosis not present

## 2015-02-04 LAB — CBC WITH DIFFERENTIAL/PLATELET
Basophils Absolute: 0 10*3/uL (ref 0.0–0.1)
Basophils Relative: 1 % (ref 0–1)
EOS PCT: 4 % (ref 0–5)
Eosinophils Absolute: 0.2 10*3/uL (ref 0.0–0.7)
HEMATOCRIT: 40.3 % (ref 36.0–46.0)
Hemoglobin: 13.7 g/dL (ref 12.0–15.0)
LYMPHS PCT: 26 % (ref 12–46)
Lymphs Abs: 1 10*3/uL (ref 0.7–4.0)
MCH: 28.7 pg (ref 26.0–34.0)
MCHC: 34 g/dL (ref 30.0–36.0)
MCV: 84.5 fL (ref 78.0–100.0)
MPV: 9.2 fL (ref 8.6–12.4)
Monocytes Absolute: 0.3 10*3/uL (ref 0.1–1.0)
Monocytes Relative: 7 % (ref 3–12)
NEUTROS ABS: 2.4 10*3/uL (ref 1.7–7.7)
Neutrophils Relative %: 62 % (ref 43–77)
Platelets: 198 10*3/uL (ref 150–400)
RBC: 4.77 MIL/uL (ref 3.87–5.11)
RDW: 14.4 % (ref 11.5–15.5)
WBC: 3.8 10*3/uL — AB (ref 4.0–10.5)

## 2015-02-04 LAB — COMPREHENSIVE METABOLIC PANEL
ALK PHOS: 63 U/L (ref 33–130)
ALT: 14 U/L (ref 6–29)
AST: 17 U/L (ref 10–35)
Albumin: 4 g/dL (ref 3.6–5.1)
BILIRUBIN TOTAL: 0.8 mg/dL (ref 0.2–1.2)
BUN: 16 mg/dL (ref 7–25)
CO2: 30 mmol/L (ref 20–31)
CREATININE: 0.85 mg/dL (ref 0.60–0.93)
Calcium: 8.9 mg/dL (ref 8.6–10.4)
Chloride: 103 mmol/L (ref 98–110)
GLUCOSE: 81 mg/dL (ref 70–99)
Potassium: 4.4 mmol/L (ref 3.5–5.3)
SODIUM: 140 mmol/L (ref 135–146)
Total Protein: 6.3 g/dL (ref 6.1–8.1)

## 2015-02-04 LAB — LIPID PANEL
Cholesterol: 280 mg/dL — ABNORMAL HIGH (ref 125–200)
HDL: 48 mg/dL (ref >=46)
LDL CALC: 204 mg/dL — AB (ref <130)
Total CHOL/HDL Ratio: 5.8 Ratio — ABNORMAL HIGH (ref <=5.0)
Triglycerides: 142 mg/dL (ref <150)
VLDL: 28 mg/dL (ref <30)

## 2015-02-04 LAB — VITAMIN B12: VITAMIN B 12: 533 pg/mL (ref 211–911)

## 2015-02-04 MED ORDER — TRAMADOL HCL 50 MG PO TABS: 50.0000 mg | ORAL_TABLET | Freq: Two times a day (BID) | ORAL | Status: DC | PRN

## 2015-02-04 NOTE — Assessment & Plan Note (Signed)
Recheck cholesterol panel. She is on pravastatin 80 mg

## 2015-02-04 NOTE — Patient Instructions (Signed)
We will call with lab results Ultram for arthritis pain Referral to [place] with fluids for the stomach flu F/U 4 months

## 2015-02-04 NOTE — Assessment & Plan Note (Signed)
Blood pressure is well-controlled and change medication

## 2015-02-04 NOTE — Progress Notes (Signed)
Patient ID: Debra [name], female   DOB: 1934/11/24, 80 y.o.   MRN: [id number]   Subjective:    Patient ID: Debra [name], female    DOB: Jun 30, 1934, 80 y.o.   MRN: [id number]  Patient presents for 4 month F/U and Illness     issue here to follow-up chronic medical problems. For the past 3 days she has had a stomach bug. She's had diarrhea multiple times started on Friday night it is now eased up she's not had any diarrhea last night or this morning. She's also had decreased appetite. Her weight is down 5 pounds. She denies any vomiting. She denies any fever associated. No known sick contacts. She took multiple antidiarrhea medications including Pepto-Bismol and Imodium  She continues to have difficulty with her joints. She's had increased right hip pain and right knee pain. Sometimes it swells. It is quite stiff when she tries to move around. When she is off of her knees her pain is much improved. She's been taking Tylenol which is not helping very much.  Due for repeat B12 on injections    Review Of Systems:  GEN- denies fatigue, fever, weight loss,weakness, recent illness HEENT- denies eye drainage, change in vision, nasal discharge, CVS- denies chest pain, palpitations RESP- denies SOB, cough, wheeze ABD- denies N/V, change in stools, abd pain GU- denies dysuria, hematuria, dribbling, incontinence MSK- + joint pain, muscle aches, injury Neuro- denies headache, dizziness, syncope, seizure activity       Objective:    BP 130/74 mmHg  Pulse 68  Temp(Src) 98.8 F (37.1 C) (Oral)  Resp 16  Ht 5\' 3"  (1.6 m)  Wt 177 lb (80.287 kg)  BMI 31.36 kg/m2 GEN- NAD, alert and oriented x3 HEENT- PERRL, EOMI, non injected sclera, pink conjunctiva, MMM, oropharynx clear CVS- RRR, no murmur RESP-CTAB ABD-NABS,soft,mild TTP diffiusely, no rebound, no guarding,,ND MSK- Decreased ROM spine, bilat Knees/hip, mild crepitus, no effusion  EXT-trace ankle edema Pulses- Radial - 2+, DP  1+       Assessment & Plan:      Problem List Items Addressed This Visit    Vitamin D deficiency   Relevant Orders   Vitamin D, 25-hydroxy   Vitamin B12 deficiency - Primary   Relevant Orders   Vitamin B12   HYPERCHOLESTEROLEMIA, PURE    Recheck cholesterol panel. She is on pravastatin 80 mg      Relevant Orders   Lipid panel   ESSENTIAL HYPERTENSION, BENIGN    Blood pressure is well-controlled and change medication      Relevant Orders   CBC with Differential/Platelet   Comprehensive metabolic panel   DEGENERATIVE DISC DISEASE, LUMBAR SPINE    She has known degenerative disc disease of her lumbar spine which could also be contributing to some of the hip pain in the knee pain. She would like to see orthopedics as the knee continues to get worse. I will set up a referral for this. The meantime I've given her tramadol to use as needed      Relevant Medications   traMADol (ULTRAM) 50 MG tablet    Other Visit Diagnoses    Viral gastroenteritis        Significant gastroenteritis especially in elderly female. We're going to try to push the fluids and use a bland diet. Her diarrhea has let up. I expect that she regain weight lost    Primary osteoarthritis of right knee        Relevant Medications  traMADol (ULTRAM) 50 MG tablet    Other Relevant Orders    Ambulatory referral to Orthopedic Surgery       Note: This dictation was prepared with Dragon dictation along with smaller phrase technology. Any transcriptional errors that result from this process are unintentional.

## 2015-02-04 NOTE — Assessment & Plan Note (Signed)
She has known degenerative disc disease of her lumbar spine which could also be contributing to some of the hip pain in the knee pain. She would like to see orthopedics as the knee continues to get worse. I will set up a referral for this. The meantime I've given her tramadol to use as needed

## 2015-02-05 ENCOUNTER — Other Ambulatory Visit: Payer: Self-pay | Admitting: *Deleted

## 2015-02-05 ENCOUNTER — Encounter: Payer: Self-pay | Admitting: *Deleted

## 2015-02-05 LAB — VITAMIN D 25 HYDROXY (VIT D DEFICIENCY, FRACTURES): VIT D 25 HYDROXY: 19 ng/mL — AB (ref 30–100)

## 2015-02-05 MED ORDER — VITAMIN D (ERGOCALCIFEROL) 1.25 MG (50000 UNIT) PO CAPS: 50000.0000 [IU] | ORAL_CAPSULE | ORAL | Status: DC

## 2015-02-07 ENCOUNTER — Ambulatory Visit: Payer: Medicare Other

## 2015-02-09 ENCOUNTER — Other Ambulatory Visit: Payer: Self-pay | Admitting: Family Medicine

## 2015-02-11 NOTE — Telephone Encounter (Signed)
Refill appropriate and filled per protocol. 

## 2015-02-13 DIAGNOSIS — H401133 Primary open-angle glaucoma, bilateral, severe stage: Secondary | ICD-10-CM | POA: Diagnosis not present

## 2015-02-18 ENCOUNTER — Other Ambulatory Visit: Payer: Self-pay | Admitting: Interventional Cardiology

## 2015-02-18 DIAGNOSIS — M25561 Pain in right knee: Secondary | ICD-10-CM | POA: Diagnosis not present

## 2015-02-18 DIAGNOSIS — M539 Dorsopathy, unspecified: Secondary | ICD-10-CM | POA: Diagnosis not present

## 2015-02-20 ENCOUNTER — Ambulatory Visit (INDEPENDENT_AMBULATORY_CARE_PROVIDER_SITE_OTHER): Payer: Medicare Other | Admitting: Family Medicine

## 2015-02-20 DIAGNOSIS — E538 Deficiency of other specified B group vitamins: Secondary | ICD-10-CM

## 2015-02-20 MED ORDER — CYANOCOBALAMIN 1000 MCG/ML IJ SOLN
1000.0000 ug | Freq: Once | INTRAMUSCULAR | Status: AC
  Administered 2015-02-20: 1000 ug via INTRAMUSCULAR

## 2015-02-21 ENCOUNTER — Other Ambulatory Visit: Payer: Self-pay | Admitting: Family Medicine

## 2015-02-21 NOTE — Telephone Encounter (Signed)
Refill appropriate and filled per protocol. 

## 2015-03-06 DIAGNOSIS — M545 Low back pain: Secondary | ICD-10-CM | POA: Diagnosis not present

## 2015-03-06 DIAGNOSIS — M25561 Pain in right knee: Secondary | ICD-10-CM | POA: Diagnosis not present

## 2015-03-06 DIAGNOSIS — M539 Dorsopathy, unspecified: Secondary | ICD-10-CM | POA: Diagnosis not present

## 2015-03-07 ENCOUNTER — Encounter: Payer: Self-pay | Admitting: Hematology and Oncology

## 2015-03-07 ENCOUNTER — Other Ambulatory Visit (HOSPITAL_BASED_OUTPATIENT_CLINIC_OR_DEPARTMENT_OTHER): Payer: Medicare Other

## 2015-03-07 ENCOUNTER — Telehealth: Payer: Self-pay | Admitting: Hematology and Oncology

## 2015-03-07 ENCOUNTER — Ambulatory Visit (HOSPITAL_BASED_OUTPATIENT_CLINIC_OR_DEPARTMENT_OTHER): Payer: Medicare Other | Admitting: Hematology and Oncology

## 2015-03-07 ENCOUNTER — Ambulatory Visit: Payer: Medicare Other

## 2015-03-07 VITALS — BP 178/78 | HR 72 | Temp 98.0°F | Resp 18 | Ht 63.0 in | Wt 176.6 lb

## 2015-03-07 DIAGNOSIS — D72818 Other decreased white blood cell count: Secondary | ICD-10-CM

## 2015-03-07 DIAGNOSIS — E538 Deficiency of other specified B group vitamins: Secondary | ICD-10-CM | POA: Diagnosis not present

## 2015-03-07 DIAGNOSIS — D72819 Decreased white blood cell count, unspecified: Secondary | ICD-10-CM

## 2015-03-07 LAB — CBC WITH DIFFERENTIAL/PLATELET
BASO%: 1.3 % (ref 0.0–2.0)
BASOS ABS: 0.1 10*3/uL (ref 0.0–0.1)
EOS%: 3.3 % (ref 0.0–7.0)
Eosinophils Absolute: 0.2 10*3/uL (ref 0.0–0.5)
HEMATOCRIT: 44.1 % (ref 34.8–46.6)
HGB: 14.5 g/dL (ref 11.6–15.9)
LYMPH%: 24.6 % (ref 14.0–49.7)
MCH: 28.3 pg (ref 25.1–34.0)
MCHC: 32.8 g/dL (ref 31.5–36.0)
MCV: 86.5 fL (ref 79.5–101.0)
MONO#: 0.3 10*3/uL (ref 0.1–0.9)
MONO%: 5.7 % (ref 0.0–14.0)
NEUT#: 3 10*3/uL (ref 1.5–6.5)
NEUT%: 65.1 % (ref 38.4–76.8)
Platelets: 171 10*3/uL (ref 145–400)
RBC: 5.1 10*6/uL (ref 3.70–5.45)
RDW: 14.3 % (ref 11.2–14.5)
WBC: 4.5 10*3/uL (ref 3.9–10.3)
lymph#: 1.1 10*3/uL (ref 0.9–3.3)

## 2015-03-07 LAB — COMPREHENSIVE METABOLIC PANEL
ALT: 12 U/L (ref 0–55)
AST: 14 U/L (ref 5–34)
Albumin: 3.9 g/dL (ref 3.5–5.0)
Alkaline Phosphatase: 73 U/L (ref 40–150)
Anion Gap: 9 mEq/L (ref 3–11)
BUN: 17.1 mg/dL (ref 7.0–26.0)
CALCIUM: 9.4 mg/dL (ref 8.4–10.4)
CHLORIDE: 106 meq/L (ref 98–109)
CO2: 24 meq/L (ref 22–29)
Creatinine: 1 mg/dL (ref 0.6–1.1)
EGFR: 54 mL/min/{1.73_m2} — ABNORMAL LOW (ref >90)
Glucose: 108 mg/dl (ref 70–140)
POTASSIUM: 4.1 meq/L (ref 3.5–5.1)
Sodium: 140 mEq/L (ref 136–145)
Total Bilirubin: 0.53 mg/dL (ref 0.20–1.20)
Total Protein: 7 g/dL (ref 6.4–8.3)

## 2015-03-07 NOTE — Addendum Note (Signed)
Addended by: Prentiss Bells on: 03/07/2015 05:10 PM   Modules accepted: Medications

## 2015-03-07 NOTE — Assessment & Plan Note (Signed)
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 Kahn and being treated with once a month vitamin B 12 1000 g injections at least since April 2012. I reviewed recent blood counts and they appeared to be normal. Hemoglobin level and MCV are also normal. Hence at this point we decided to continue on with the same treatment and we will see her annually for follow-up. 

## 2015-03-07 NOTE — Addendum Note (Signed)
Addended by: Prentiss Bells on: 03/07/2015 06:01 PM   Modules accepted: Orders, Medications

## 2015-03-07 NOTE — Telephone Encounter (Signed)
Appointments made and avs pritned for patient °

## 2015-03-07 NOTE — Progress Notes (Signed)
Patient Care Team: Alycia Rossetti, MD as PCP - General (Family Medicine)  DIAGNOSIS: leukopenia due to B-12 deficiency  CHIEF COMPLIANT:  Chronic back problems  INTERVAL HISTORY: Debra [name] is a  81 year old with above-mentioned history of leukopenia who is here for annual follow-up. She has been getting B-12 injections through her primary care physician once a month. It appears to be working out the best for her. She denies any problems or concerns with regards to B-12 injection. Chest chronic low back problems  For which , she plans to get a back MRI  REVIEW OF SYSTEMS:   Constitutional: Denies fevers, chills or abnormal weight loss Eyes: Denies blurriness of vision Ears, nose, mouth, throat, and face: Denies mucositis or sore throat Respiratory: Denies cough, dyspnea or wheezes Cardiovascular: Denies palpitation, chest discomfort Gastrointestinal:  Denies nausea, heartburn or change in bowel habits Skin: Denies abnormal skin rashes Lymphatics: Denies new lymphadenopathy or easy bruising Neurological:Denies numbness, tingling or new weaknesses Behavioral/Psych: Mood is stable, no new changes  Extremities: No lower extremity edema  All other systems were reviewed with the patient and are negative.  I have reviewed the past medical history, past surgical history, social history and family history with the patient and they are unchanged from previous note.  ALLERGIES:  is allergic to codeine phosphate; sulfa antibiotics; and latex.  MEDICATIONS:  Current Outpatient Prescriptions  Medication Sig Dispense Refill  . amLODipine (NORVASC) 10 MG tablet TAKE 1 TABLET BY MOUTH EVERY DAY 14 tablet 0  . atenolol (TENORMIN) 50 MG tablet Take 1 tablet (50 mg total) by mouth daily. 30 tablet 11  . brimonidine (ALPHAGAN) 0.2 % ophthalmic solution Place 1 drop into both eyes 2 (two) times daily.    . cholecalciferol (VITAMIN D) 400 UNITS TABS tablet Take 400 Units by mouth daily.      . cloNIDine (CATAPRES) 0.1 MG tablet TAKE 1 TABLET IN AM AND 2 TABLETS AT BEDTIME 270 tablet 1  . cyanocobalamin (,VITAMIN B-12,) 1000 MCG/ML injection Inject 1,000 mcg into the muscle every 30 (thirty) days.    . furosemide (LASIX) 40 MG tablet Take 1 tablet (40 mg total) by mouth daily as needed. 30 tablet 6  . ibuprofen (ADVIL,MOTRIN) 800 MG tablet Take 1 tablet (800 mg total) by mouth every 8 (eight) hours as needed (mild pain). 30 tablet 0  . latanoprost (XALATAN) 0.005 % ophthalmic solution Place 1 drop into both eyes at bedtime.     Marland Kitchen lisinopril (PRINIVIL,ZESTRIL) 40 MG tablet TAKE 1 TABLET BY MOUTH DAILY 90 tablet 1  . loperamide (IMODIUM) 2 MG capsule Take 2 capsules (4 mg total) by mouth 2 (two) times daily as needed for diarrhea or loose stools. 30 capsule 1  . ondansetron (ZOFRAN) 4 MG tablet Take 1 tablet (4 mg total) by mouth every 8 (eight) hours as needed for nausea or vomiting. 20 tablet 0  . potassium chloride SA (K-DUR,KLOR-CON) 20 MEQ tablet Take with lasix 30 tablet 6  . pravastatin (PRAVACHOL) 80 MG tablet TAKE 1 TABLET BY MOUTH ONCE A DAY 90 tablet 2  . prochlorperazine (COMPAZINE) 10 MG tablet Take 1 tablet (10 mg total) by mouth every 8 (eight) hours as needed for nausea or vomiting. 30 tablet 0  . timolol (TIMOPTIC) 0.5 % ophthalmic solution Place 1 drop into both eyes 2 (two) times daily.     . traMADol (ULTRAM) 50 MG tablet Take 1 tablet (50 mg total) by mouth 2 (two) times daily as needed.  30 tablet 1  . triamcinolone cream (KENALOG) 0.1 % Apply 1 application topically 2 (two) times daily. 30 g 0  . Vitamin D, Ergocalciferol, (DRISDOL) 50000 UNITS CAPS capsule Take 1 capsule (50,000 Units total) by mouth every 7 (seven) days. 12 capsule 0   No current facility-administered medications for this visit.    PHYSICAL EXAMINATION: ECOG PERFORMANCE STATUS: 0 - Asymptomatic  Filed Vitals:   03/07/15 1526  BP: 178/78  Pulse: 72  Temp: 98 F (36.7 C)  Resp: 18    Filed Weights   03/07/15 1526  Weight: 176 lb 9.6 oz (80.105 kg)    GENERAL:alert, no distress and comfortable SKIN: skin color, texture, turgor are normal, no rashes or significant lesions EYES: normal, Conjunctiva are pink and non-injected, sclera clear OROPHARYNX:no exudate, no erythema and lips, buccal mucosa, and tongue normal  NECK: supple, thyroid normal size, non-tender, without nodularity LYMPH:  no palpable lymphadenopathy in the cervical, axillary or inguinal LUNGS: clear to auscultation and percussion with normal breathing effort HEART: regular rate & rhythm and no murmurs and no lower extremity edema ABDOMEN:abdomen soft, non-tender and normal bowel sounds MUSCULOSKELETAL: back pain NEURO: alert & oriented x 3 with fluent speech, no focal motor/sensory deficits EXTREMITIES: No lower extremity edema   LABORATORY DATA:  I have reviewed the data as listed   Chemistry      Component Value Date/Time   NA 140 02/04/2015 1136   NA 142 03/12/2014 1604   NA 140 06/04/2009 1057   K 4.4 02/04/2015 1136   K 4.0 03/12/2014 1604   K 4.2 06/04/2009 1057   CL 103 02/04/2015 1136   CL 103 06/04/2009 1057   CO2 30 02/04/2015 1136   CO2 27 03/12/2014 1604   CO2 29 06/04/2009 1057   BUN 16 02/04/2015 1136   BUN 13.6 03/12/2014 1604   BUN 17 06/04/2009 1057   CREATININE 0.85 02/04/2015 1136   CREATININE 0.9 03/12/2014 1604   CREATININE 0.77 02/01/2014 1030      Component Value Date/Time   CALCIUM 8.9 02/04/2015 1136   CALCIUM 9.3 03/12/2014 1604   CALCIUM 9.7 06/04/2009 1057   ALKPHOS 63 02/04/2015 1136   ALKPHOS 117 03/12/2014 1604   ALKPHOS 101* 06/04/2009 1057   AST 17 02/04/2015 1136   AST 18 03/12/2014 1604   AST 36 06/04/2009 1057   ALT 14 02/04/2015 1136   ALT 34 03/12/2014 1604   ALT 54* 06/04/2009 1057   BILITOT 0.8 02/04/2015 1136   BILITOT 0.38 03/12/2014 1604   BILITOT 0.50 06/04/2009 1057       Lab Results  Component Value Date   WBC 4.5  03/07/2015   HGB 14.5 03/07/2015   HCT 44.1 03/07/2015   MCV 86.5 03/07/2015   PLT 171 03/07/2015   NEUTROABS 3.0 03/07/2015     ASSESSMENT & PLAN:  Leukocytopenia 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 Chancy Milroy and being treated with once a month vitamin B 12 1000 g injections at least since April 2012. I reviewed recent blood counts and they appeared to be normal. Hemoglobin level and MCV are also normal. Hence at this point we decided to continue on with the same treatment and we will see her annually for follow-up.   No orders of the defined types were placed in this encounter.   The patient has a good understanding of the overall plan. she agrees with it. she will call with any problems that may develop before the next visit here.  Rulon Eisenmenger, MD 03/07/2015

## 2015-03-08 ENCOUNTER — Other Ambulatory Visit: Payer: Self-pay | Admitting: Family Medicine

## 2015-03-08 MED ORDER — AMLODIPINE BESYLATE 10 MG PO TABS: 10.0000 mg | ORAL_TABLET | Freq: Every day | ORAL | Status: DC

## 2015-03-08 NOTE — Telephone Encounter (Signed)
Medication refilled per protocol. 

## 2015-03-15 DIAGNOSIS — M4316 Spondylolisthesis, lumbar region: Secondary | ICD-10-CM | POA: Diagnosis not present

## 2015-03-20 DIAGNOSIS — M545 Low back pain: Secondary | ICD-10-CM | POA: Diagnosis not present

## 2015-03-27 ENCOUNTER — Ambulatory Visit (INDEPENDENT_AMBULATORY_CARE_PROVIDER_SITE_OTHER): Payer: Medicare Other | Admitting: *Deleted

## 2015-03-27 DIAGNOSIS — E538 Deficiency of other specified B group vitamins: Secondary | ICD-10-CM

## 2015-03-27 MED ORDER — CYANOCOBALAMIN 1000 MCG/ML IJ SOLN
1000.0000 ug | Freq: Once | INTRAMUSCULAR | Status: AC
  Administered 2015-03-27: 1000 ug via INTRAMUSCULAR

## 2015-04-01 DIAGNOSIS — M47816 Spondylosis without myelopathy or radiculopathy, lumbar region: Secondary | ICD-10-CM | POA: Diagnosis not present

## 2015-04-26 ENCOUNTER — Ambulatory Visit (INDEPENDENT_AMBULATORY_CARE_PROVIDER_SITE_OTHER): Payer: Medicare Other | Admitting: Family Medicine

## 2015-04-26 DIAGNOSIS — E538 Deficiency of other specified B group vitamins: Secondary | ICD-10-CM

## 2015-04-26 MED ORDER — CYANOCOBALAMIN 1000 MCG/ML IJ SOLN
1000.0000 ug | INTRAMUSCULAR | Status: DC
  Administered 2015-04-26 – 2015-05-27 (×2): 1000 ug via INTRAMUSCULAR

## 2015-05-01 DIAGNOSIS — M545 Low back pain: Secondary | ICD-10-CM | POA: Diagnosis not present

## 2015-05-01 DIAGNOSIS — M47816 Spondylosis without myelopathy or radiculopathy, lumbar region: Secondary | ICD-10-CM | POA: Diagnosis not present

## 2015-05-27 ENCOUNTER — Ambulatory Visit (INDEPENDENT_AMBULATORY_CARE_PROVIDER_SITE_OTHER): Payer: Medicare Other | Admitting: Family Medicine

## 2015-05-27 DIAGNOSIS — E538 Deficiency of other specified B group vitamins: Secondary | ICD-10-CM

## 2015-06-04 DIAGNOSIS — D049 Carcinoma in situ of skin, unspecified: Secondary | ICD-10-CM | POA: Diagnosis not present

## 2015-06-04 DIAGNOSIS — D489 Neoplasm of uncertain behavior, unspecified: Secondary | ICD-10-CM | POA: Diagnosis not present

## 2015-06-05 ENCOUNTER — Encounter: Payer: Self-pay | Admitting: Family Medicine

## 2015-06-05 ENCOUNTER — Ambulatory Visit (INDEPENDENT_AMBULATORY_CARE_PROVIDER_SITE_OTHER): Payer: Medicare Other | Admitting: Family Medicine

## 2015-06-05 VITALS — BP 142/80 | HR 78 | Temp 98.6°F | Resp 18 | Ht 63.0 in | Wt 179.0 lb

## 2015-06-05 DIAGNOSIS — I10 Essential (primary) hypertension: Secondary | ICD-10-CM | POA: Diagnosis not present

## 2015-06-05 DIAGNOSIS — E7800 Pure hypercholesterolemia, unspecified: Secondary | ICD-10-CM | POA: Diagnosis not present

## 2015-06-05 DIAGNOSIS — R609 Edema, unspecified: Secondary | ICD-10-CM | POA: Diagnosis not present

## 2015-06-05 DIAGNOSIS — R238 Other skin changes: Secondary | ICD-10-CM | POA: Diagnosis not present

## 2015-06-05 DIAGNOSIS — D489 Neoplasm of uncertain behavior, unspecified: Secondary | ICD-10-CM | POA: Diagnosis not present

## 2015-06-05 DIAGNOSIS — R233 Spontaneous ecchymoses: Secondary | ICD-10-CM

## 2015-06-05 LAB — COMPREHENSIVE METABOLIC PANEL
ALK PHOS: 66 U/L (ref 33–130)
ALT: 15 U/L (ref 6–29)
AST: 17 U/L (ref 10–35)
Albumin: 4.3 g/dL (ref 3.6–5.1)
BUN: 15 mg/dL (ref 7–25)
CO2: 26 mmol/L (ref 20–31)
CREATININE: 0.76 mg/dL (ref 0.60–0.93)
Calcium: 9.5 mg/dL (ref 8.6–10.4)
Chloride: 104 mmol/L (ref 98–110)
GLUCOSE: 88 mg/dL (ref 70–99)
Potassium: 4.4 mmol/L (ref 3.5–5.3)
SODIUM: 140 mmol/L (ref 135–146)
TOTAL PROTEIN: 6.8 g/dL (ref 6.1–8.1)
Total Bilirubin: 0.7 mg/dL (ref 0.2–1.2)

## 2015-06-05 LAB — CBC WITH DIFFERENTIAL/PLATELET
Basophils Absolute: 38 cells/uL (ref 0–200)
Basophils Relative: 1 %
Eosinophils Absolute: 152 cells/uL (ref 15–500)
Eosinophils Relative: 4 %
HCT: 41.3 % (ref 35.0–45.0)
Hemoglobin: 13.9 g/dL (ref 12.0–15.0)
Lymphocytes Relative: 29 %
Lymphs Abs: 1102 cells/uL (ref 850–3900)
MCH: 29.8 pg (ref 27.0–33.0)
MCHC: 33.7 g/dL (ref 32.0–36.0)
MCV: 88.6 fL (ref 80.0–100.0)
MPV: 9.2 fL (ref 7.5–12.5)
Monocytes Absolute: 228 cells/uL (ref 200–950)
Monocytes Relative: 6 %
Neutro Abs: 2280 cells/uL (ref 1500–7800)
Neutrophils Relative %: 60 %
Platelets: 163 10*3/uL (ref 140–400)
RBC: 4.66 MIL/uL (ref 3.80–5.10)
RDW: 14.1 % (ref 11.0–15.0)
WBC: 3.8 10*3/uL (ref 3.8–10.8)

## 2015-06-05 LAB — LIPID PANEL
Cholesterol: 203 mg/dL — ABNORMAL HIGH (ref 125–200)
HDL: 46 mg/dL (ref >=46)
LDL Cholesterol: 127 mg/dL (ref <130)
Total CHOL/HDL Ratio: 4.4 Ratio (ref <=5.0)
Triglycerides: 150 mg/dL — ABNORMAL HIGH (ref <150)
VLDL: 30 mg/dL (ref <30)

## 2015-06-05 LAB — PROTIME-INR
INR: 0.92 (ref <1.50)
Prothrombin Time: 12.5 seconds (ref 11.6–15.2)

## 2015-06-05 NOTE — Assessment & Plan Note (Signed)
Blood pressure looks good today no change in medication Resting labs are drawn for her cholesterol and her renal function, he is taking pravastatin on a daily basis now. Regards to the easy bruising I think this is just thin skin with aging she is not having any abnormal bleeding. She is not on any blood thinner. I will check a PT/INR with her labs. Lesion on her arm looks like an irritated seborrheic keratosis however she has had some scaly lesions before that been basal cell carcinomas therefore 2 mm punch biopsy performed today on the lesion

## 2015-06-05 NOTE — Progress Notes (Signed)
Patient ID: Debra [name], female   DOB: 1934/09/28, 81 y.o.   MRN: [id number]    Subjective:    Patient ID: Debra [name], female    DOB: Oct 29, 1934, 81 y.o.   MRN: [id number]  Patient presents for 4 month F/u Patient to follow chronic medical process. Her only concern is that she's had some increased leg swelling mostly in her ankles for the past week. Her weight is fairly stable. She has been up on her feet more doing more activities the past week. She denies chest pain or shortness of breath.  She is also concerned about a red spot on her left arm she states it'll occasionally peel off but it comes back and will not heal. She's concerned as she has had skin cancer in the past.  He is taking all her medications as prescribed Did have epidural injection performed by Dr. Rush Farmer about 8 weeks ago  I reviewed her last oncology note    Review Of Systems:  GEN- denies fatigue, fever, weight loss,weakness, recent illness HEENT- denies eye drainage, change in vision, nasal discharge, CVS- denies chest pain, palpitations RESP- denies SOB, cough, wheeze ABD- denies N/V, change in stools, abd pain GU- denies dysuria, hematuria, dribbling, incontinence MSK- +joint pain, muscle aches, injury Neuro- denies headache, dizziness, syncope, seizure activity       Objective:    BP 142/80 mmHg  Pulse 78  Temp(Src) 98.6 F (37 C) (Oral)  Resp 18  Ht 5\' 3"  (1.6 m)  Wt 179 lb (81.194 kg)  BMI 31.72 kg/m2 GEN- NAD, alert and oriented x3,well appearing HEENT- PERRL, EOMI, non injected sclera, pink conjunctiva, MMM, oropharynx clear Neck- Supple, no JVD  CVS- RRR, no murmur RESP-CTAB ABD-NABS,soft,NT,ND Skin- Left upper arm- dime size irregular raised erythematous scaly lesion EXT- 1+ pedal edema  Pulses- Radial, DP- 2+  Procedure- Punch Biopsy  Procedure explained to patient questions answered benefits and risks discussed verbal consent obtained. Antiseptic-Betadine   Anesthesia-lidocaine 1% 1mm Punch  Minimal blood loss, Pressure bandage applied due to peristant oozing  Patient tolerated procedure well Bandage applied         Assessment & Plan:      Problem List Items Addressed This Visit    Peripheral edema    Will increase her Lasix to 40 mg twice a day for the next 3 days supply the rest of flu she will elevate her feet and wear compression hose      HYPERCHOLESTEROLEMIA, PURE   Relevant Orders   Lipid panel   ESSENTIAL HYPERTENSION, BENIGN - Primary    Blood pressure looks good today no change in medication Resting labs are drawn for her cholesterol and her renal function, he is taking pravastatin on a daily basis now. Regards to the easy bruising I think this is just thin skin with aging she is not having any abnormal bleeding. She is not on any blood thinner. I will check a PT/INR with her labs. Lesion on her arm looks like an irritated seborrheic keratosis however she has had some scaly lesions before that been basal cell carcinomas therefore 2 mm punch biopsy performed today on the lesion      Relevant Orders   CBC with Differential/Platelet   Comprehensive metabolic panel    Other Visit Diagnoses    Easy bruising        Relevant Orders    Protime-INR    Neoplasm of uncertain behavior        Relevant Orders  Pathology Report       Note: This dictation was prepared with Dragon dictation along with smaller phrase technology. Any transcriptional errors that result from this process are unintentional.

## 2015-06-05 NOTE — Assessment & Plan Note (Signed)
Will increase her Lasix to 40 mg twice a day for the next 3 days supply the rest of flu she will elevate her feet and wear compression hose

## 2015-06-05 NOTE — Patient Instructions (Signed)
We will call with lab results Take lasix 40mg  twice a day for 3 days Continue all other medications  F/U 4 months

## 2015-06-06 LAB — PATHOLOGY

## 2015-06-07 ENCOUNTER — Other Ambulatory Visit: Payer: Self-pay | Admitting: Family Medicine

## 2015-06-07 DIAGNOSIS — C44629 Squamous cell carcinoma of skin of left upper limb, including shoulder: Secondary | ICD-10-CM

## 2015-06-07 DIAGNOSIS — C4492 Squamous cell carcinoma of skin, unspecified: Secondary | ICD-10-CM

## 2015-06-18 DIAGNOSIS — H401230 Low-tension glaucoma, bilateral, stage unspecified: Secondary | ICD-10-CM | POA: Diagnosis not present

## 2015-07-02 ENCOUNTER — Ambulatory Visit (INDEPENDENT_AMBULATORY_CARE_PROVIDER_SITE_OTHER): Payer: Medicare Other | Admitting: Family Medicine

## 2015-07-02 DIAGNOSIS — E538 Deficiency of other specified B group vitamins: Secondary | ICD-10-CM

## 2015-07-02 DIAGNOSIS — H903 Sensorineural hearing loss, bilateral: Secondary | ICD-10-CM | POA: Diagnosis not present

## 2015-07-02 MED ORDER — CYANOCOBALAMIN 1000 MCG/ML IJ SOLN
1000.0000 ug | INTRAMUSCULAR | Status: DC
  Administered 2015-07-02 – 2019-05-03 (×37): 1000 ug via INTRAMUSCULAR

## 2015-08-05 ENCOUNTER — Ambulatory Visit (INDEPENDENT_AMBULATORY_CARE_PROVIDER_SITE_OTHER): Payer: Medicare Other | Admitting: *Deleted

## 2015-08-05 DIAGNOSIS — E538 Deficiency of other specified B group vitamins: Secondary | ICD-10-CM | POA: Diagnosis not present

## 2015-08-07 ENCOUNTER — Other Ambulatory Visit: Payer: Self-pay

## 2015-08-07 MED ORDER — CLONIDINE HCL 0.1 MG PO TABS: ORAL_TABLET | ORAL | Status: DC

## 2015-08-14 DIAGNOSIS — C44629 Squamous cell carcinoma of skin of left upper limb, including shoulder: Secondary | ICD-10-CM | POA: Diagnosis not present

## 2015-08-14 DIAGNOSIS — D0462 Carcinoma in situ of skin of left upper limb, including shoulder: Secondary | ICD-10-CM | POA: Diagnosis not present

## 2015-09-06 ENCOUNTER — Ambulatory Visit (INDEPENDENT_AMBULATORY_CARE_PROVIDER_SITE_OTHER): Payer: Medicare Other | Admitting: *Deleted

## 2015-09-06 DIAGNOSIS — E538 Deficiency of other specified B group vitamins: Secondary | ICD-10-CM | POA: Diagnosis not present

## 2015-09-06 NOTE — Progress Notes (Signed)
Patient ID: Debra [name], female   DOB: 1934/08/29, 81 y.o.   MRN: [id number]  Patient seen in office for Vitamin B 12 injection.   Tolerated IM administration well.

## 2015-10-08 ENCOUNTER — Ambulatory Visit (INDEPENDENT_AMBULATORY_CARE_PROVIDER_SITE_OTHER): Payer: Medicare Other | Admitting: Family Medicine

## 2015-10-08 ENCOUNTER — Telehealth: Payer: Self-pay | Admitting: *Deleted

## 2015-10-08 ENCOUNTER — Other Ambulatory Visit: Payer: Self-pay | Admitting: Family Medicine

## 2015-10-08 DIAGNOSIS — E538 Deficiency of other specified B group vitamins: Secondary | ICD-10-CM | POA: Diagnosis not present

## 2015-10-08 NOTE — Telephone Encounter (Signed)
Received call from Gerald Stabs, pharmacist with [facility].   Reports that Atenolol 50mg  is on back order. Requested new order for Atenolol 100mg  (1/2) tab PO QD.   MD please advise.

## 2015-10-08 NOTE — Telephone Encounter (Signed)
Refill appropriate and filled per protocol. 

## 2015-10-08 NOTE — Telephone Encounter (Signed)
Okay to change? 

## 2015-10-09 MED ORDER — ATENOLOL 100 MG PO TABS: 50.0000 mg | ORAL_TABLET | Freq: Every day | ORAL | 3 refills | Status: DC

## 2015-10-09 NOTE — Telephone Encounter (Signed)
Prescription sent to pharmacy. .   Call placed to patient and patient made aware.  

## 2015-10-14 DIAGNOSIS — H40153 Residual stage of open-angle glaucoma, bilateral: Secondary | ICD-10-CM | POA: Diagnosis not present

## 2015-11-08 ENCOUNTER — Ambulatory Visit (INDEPENDENT_AMBULATORY_CARE_PROVIDER_SITE_OTHER): Payer: Medicare Other | Admitting: Family Medicine

## 2015-11-08 DIAGNOSIS — E538 Deficiency of other specified B group vitamins: Secondary | ICD-10-CM

## 2015-11-08 DIAGNOSIS — Z23 Encounter for immunization: Secondary | ICD-10-CM | POA: Diagnosis not present

## 2015-11-25 ENCOUNTER — Other Ambulatory Visit: Payer: Self-pay | Admitting: Family Medicine

## 2015-12-09 ENCOUNTER — Other Ambulatory Visit: Payer: Self-pay | Admitting: Family Medicine

## 2015-12-10 NOTE — Telephone Encounter (Signed)
Medication refill for one time only.  Patient needs to be seen.  Letter sent for patient to call and schedule 

## 2015-12-11 ENCOUNTER — Ambulatory Visit (INDEPENDENT_AMBULATORY_CARE_PROVIDER_SITE_OTHER): Payer: Medicare Other | Admitting: Family Medicine

## 2015-12-11 DIAGNOSIS — E538 Deficiency of other specified B group vitamins: Secondary | ICD-10-CM

## 2015-12-28 ENCOUNTER — Encounter (HOSPITAL_COMMUNITY): Payer: Self-pay | Admitting: Emergency Medicine

## 2015-12-28 ENCOUNTER — Ambulatory Visit (HOSPITAL_COMMUNITY)
Admission: EM | Admit: 2015-12-28 | Discharge: 2015-12-28 | Disposition: A | Discharge status: Home / Self Care | Payer: Medicare Other | Attending: Family Medicine | Admitting: Family Medicine

## 2015-12-28 DIAGNOSIS — M25562 Pain in left knee: Secondary | ICD-10-CM | POA: Diagnosis not present

## 2015-12-28 MED ORDER — BUPIVACAINE HCL (PF) 0.5 % IJ SOLN
INTRAMUSCULAR | Status: AC
  Filled 2015-12-28: qty 10

## 2015-12-28 MED ORDER — METHYLPREDNISOLONE ACETATE 80 MG/ML IJ SUSP
INTRAMUSCULAR | Status: AC
  Filled 2015-12-28: qty 1

## 2015-12-28 NOTE — ED Triage Notes (Signed)
The patient presented to the [facility] with a complaint of left knee pain x 5 days. The patient denied any known injury but did report increased amounts of walking due to a family member being in the hospital. The patient reported a hx of arthritis.

## 2015-12-28 NOTE — ED Provider Notes (Signed)
Debra [name]    CSN: [id number] Arrival date & time: 12/28/15  1416     History   Chief Complaint Chief Complaint  Patient presents with  . Knee Pain    HPI Debra [name] is a 81 y.o. female.   Is a 81 year old woman who recently was doing significantly greater amount of walking because her husband has been hospitalized. She has developed left knee pain.  She has a history of osteoarthritis. She's had a shot in the other knee and in her back in the past without problem. She has seen Dr. Joni Fears the past.  Patient's had no trauma. Her knee is sore on the inside and anterior aspect of the left knee.        Past Medical History:  Diagnosis Date  . Brain mass    monitor by Dr Trenton Gammon - right posterior fossa meningioma - thinks it's benign per patient  . Cancer (HCC)    hand, face - spots removed  . Constipation   . Degeneration of lumbar or lumbosacral intervertebral disc    arthritis in back, hips and hands - otc med prn  . Diarrhea   . Diverticulosis of colon with hemorrhage   . Dysrhythmia    Hx irregular heart beat - 15 yrs ago  . Female stress incontinence   . Headache    otc med prn  . Hypertension   . Lumbago   . Missed abortion    x 2 - no surgery required  . Paroxysmal supraventricular tachycardia (HCC)    Hx  . PONV (postoperative nausea and vomiting)   . Pure hypercholesterolemia   . SVD (spontaneous vaginal delivery)    x 4  . Unspecified glaucoma(365.9)    bilateral    Patient Active Problem List   Diagnosis Date Noted  . Peripheral edema 05/22/2014  . Pelvic pain in female 02/06/2014  . Meningioma ([place]) 04/29/2012  . Vestibular schwannoma ([place]) 03/26/2012  . Vertigo 03/24/2012  . Vitamin D deficiency 09/24/2009  . Vitamin B12 deficiency 07/05/2009  . Leukocytopenia 10/26/2008  . OSTEOPENIA 10/16/2008  . ESSENTIAL HYPERTENSION, BENIGN 09/05/2008  . MELANOMA, HX OF 09/05/2008  . ISCHEMIC COLITIS, HX OF 05/26/2007    . HYPERCHOLESTEROLEMIA, PURE 08/20/2006  . GLAUCOMA NOS 04/21/2006  . PSVT 04/21/2006  . FEMALE STRESS INCONTINENCE 04/21/2006  . DEGENERATIVE DISC DISEASE, LUMBAR SPINE 04/21/2006  . LOW BACK PAIN, CHRONIC 04/21/2006  . PUD, HX OF 04/21/2006  . DIVERTICULOSIS, COLON W/HEM 12/01/2005    Past Surgical History:  Procedure Laterality Date  . ABDOMINAL HYSTERECTOMY    . APPENDECTOMY    . COLONOSCOPY    . EYE SURGERY     bilateral cataract eye surgery  . EYE SURGERY     laser to relieve eye pressure - bilateral  . LAPAROTOMY N/A 02/06/2014   Procedure: EXPLORATORY LAPAROTOMY;  Surgeon: Lavonia Drafts, MD;  Location: [facility];  Service: Gynecology;  Laterality: N/A;  . MULTIPLE TOOTH EXTRACTIONS     upper  . right hand surgery     ganglion cyst removed  . TUBAL LIGATION      OB History    Gravida Para Term Preterm AB Living   6 4 0 0 2 4   SAB TAB Ectopic Multiple Live Births   2 0 0 0         Home Medications    Prior to Admission medications   Medication Sig Start Date End Date Taking? Authorizing Provider  amLODipine (  NORVASC) 10 MG tablet TAKE 1 TABLET BY MOUTH ONCE A DAY 12/10/15  Yes Alycia Rossetti, MD  atenolol (TENORMIN) 100 MG tablet Take 0.5 tablets (50 mg total) by mouth daily. 10/09/15  Yes Alycia Rossetti, MD  cloNIDine (CATAPRES) 0.1 MG tablet TAKE 1 TABLET IN AM AND 2 TABLETS AT BEDTIME 08/07/15  Yes Alycia Rossetti, MD  furosemide (LASIX) 40 MG tablet Take 1 tablet (40 mg total) by mouth daily as needed. 10/05/14  Yes Alycia Rossetti, MD  latanoprost (XALATAN) 0.005 % ophthalmic solution Place 1 drop into both eyes at bedtime.  02/17/11  Yes Historical Provider, MD  lisinopril (PRINIVIL,ZESTRIL) 40 MG tablet TAKE 1 TABLET BY MOUTH ONCE A DAY 11/25/15  Yes Alycia Rossetti, MD  potassium chloride SA (K-DUR,KLOR-CON) 20 MEQ tablet Take with lasix 10/05/14  Yes Alycia Rossetti, MD  pravastatin (PRAVACHOL) 80 MG tablet TAKE 1 TABLET BY MOUTH ONCE A DAY  11/19/14  Yes Alycia Rossetti, MD  timolol (TIMOPTIC) 0.5 % ophthalmic solution Place 1 drop into both eyes 2 (two) times daily.  06/30/12  Yes Historical Provider, MD  brimonidine (ALPHAGAN) 0.2 % ophthalmic solution Place 1 drop into both eyes 2 (two) times daily. 02/17/11   Historical Provider, MD  cholecalciferol (VITAMIN D) 400 UNITS TABS tablet Take 400 Units by mouth daily. Reported on 06/05/2015    Historical Provider, MD  loperamide (IMODIUM) 2 MG capsule Take 2 capsules (4 mg total) by mouth 2 (two) times daily as needed for diarrhea or loose stools. 02/21/14   Alycia Rossetti, MD  prochlorperazine (COMPAZINE) 10 MG tablet Take 1 tablet (10 mg total) by mouth every 8 (eight) hours as needed for nausea or vomiting. 02/16/14   Alycia Rossetti, MD  traMADol (ULTRAM) 50 MG tablet Take 1 tablet (50 mg total) by mouth 2 (two) times daily as needed. 02/04/15   Alycia Rossetti, MD  triamcinolone cream (KENALOG) 0.1 % Apply 1 application topically 2 (two) times daily. 11/14/13   Alycia Rossetti, MD    Family History Family History  Problem Relation Age of Onset  . Aneurysm Mother   . Alzheimer's disease Mother   . Cancer Father     prostate  . Cancer Sister   . Arthritis Sister   . Heart disease Brother   . Hypertension Brother   . Glaucoma Brother     Social History Social History  Substance Use Topics  . Smoking status: Never Smoker  . Smokeless tobacco: Never Used  . Alcohol use No     Allergies   Codeine phosphate; Sulfa antibiotics; and Latex   Review of Systems Review of Systems  Constitutional: Negative.   HENT: Negative.   Respiratory: Negative.   Cardiovascular: Negative.      Physical Exam Triage Vital Signs ED Triage Vitals  Enc Vitals Group     BP 12/28/15 1456 168/80     Pulse Rate 12/28/15 1456 84     Resp 12/28/15 1456 18     Temp 12/28/15 1456 98.4 F (36.9 C)     Temp Source 12/28/15 1456 Oral     SpO2 12/28/15 1456 96 %     Weight --       Height --      Head Circumference --      Peak Flow --      Pain Score 12/28/15 1500 8     Pain Loc --      Pain Edu? --  Excl. in GC? --    No data found.   Updated Vital Signs BP 168/80 (BP Location: Right Arm)   Pulse 84   Temp 98.4 F (36.9 C) (Oral)   Resp 18   SpO2 96%   Physical Exam  Constitutional: She is oriented to person, place, and time. She appears well-developed and well-nourished.  HENT:  Head: Normocephalic.  Right Ear: External ear normal.  Left Ear: External ear normal.  Mouth/Throat: Oropharynx is clear and moist.  Eyes: Conjunctivae and EOM are normal. Pupils are equal, round, and reactive to light.  Neck: Normal range of motion. Neck supple.  Musculoskeletal: Normal range of motion.  Patient has mild tenderness to the medial joint line of her left knee. She has a small effusion. There is no overlying erythema or skin break.  She has mild crepitus with extension and flexion of the left knee. Her range of motion is relatively normal for a person of 82 years  Neurological: She is alert and oriented to person, place, and time.  Skin: Skin is warm and dry.  Psychiatric: She has a normal mood and affect. Her behavior is normal.  Nursing note and vitals reviewed.    UC Treatments / Results  Labs (all labs ordered are listed, but only abnormal results are displayed) Labs Reviewed - No data to display  EKG  EKG Interpretation None       Radiology No results found.  Procedures .Joint Aspiration/Arthrocentesis Date/Time: 12/28/2015 3:25 PM Performed by: Robyn Haber Authorized by: Robyn Haber   Consent:    Consent obtained:  Verbal   Consent given by:  Patient   Risks discussed:  Pain   Alternatives discussed:  No treatment and alternative treatment Location:    Location:  Knee   Knee:  L knee Anesthesia (see MAR for exact dosages):    Anesthesia method:  None Procedure details:    Needle gauge:  22 G   Ultrasound  guidance: no     Approach:  Anterior   Steroid injected: yes     Specimen collected: no   Post-procedure details:    Dressing:  Adhesive bandage   Patient tolerance of procedure:  Tolerated well, no immediate complications   (including critical care time)  Medications Ordered in UC Medications - No data to display   Initial Impression / Assessment and Plan / UC Course  I have reviewed the triage vital signs and the nursing notes.  Pertinent labs & imaging results that were available during my care of the patient were reviewed by me and considered in my medical decision making (see chart for details).  Clinical Course    Final Clinical Impressions(s) / UC Diagnoses   Final diagnoses:  Acute pain of left knee    New Prescriptions New Prescriptions   No medications on file  Knee injection as noted above   Robyn Haber, MD 12/28/15 1527

## 2016-01-03 ENCOUNTER — Telehealth (INDEPENDENT_AMBULATORY_CARE_PROVIDER_SITE_OTHER): Payer: Self-pay | Admitting: Physical Medicine and Rehabilitation

## 2016-01-03 NOTE — Telephone Encounter (Signed)
Best may be to follow with Ninfa Linden first if knee complaint is main complaint, otherwise me

## 2016-01-03 NOTE — Telephone Encounter (Signed)
Patient's daughter wanted an OV with Dr. Ernestina Patches as her last back injection helped her knee pain. I scheduled this for her.

## 2016-01-08 ENCOUNTER — Ambulatory Visit (INDEPENDENT_AMBULATORY_CARE_PROVIDER_SITE_OTHER): Payer: Medicare Other

## 2016-01-08 ENCOUNTER — Encounter (INDEPENDENT_AMBULATORY_CARE_PROVIDER_SITE_OTHER): Payer: Self-pay | Admitting: Physical Medicine and Rehabilitation

## 2016-01-08 ENCOUNTER — Ambulatory Visit (INDEPENDENT_AMBULATORY_CARE_PROVIDER_SITE_OTHER): Payer: Medicare Other | Admitting: Physical Medicine and Rehabilitation

## 2016-01-08 VITALS — BP 172/86 | HR 60

## 2016-01-08 DIAGNOSIS — M25562 Pain in left knee: Secondary | ICD-10-CM | POA: Diagnosis not present

## 2016-01-08 DIAGNOSIS — G8929 Other chronic pain: Secondary | ICD-10-CM | POA: Diagnosis not present

## 2016-01-08 NOTE — Progress Notes (Signed)
Debra [name] - 81 y.o. female MRN [id number]  Date of birth: 10-19-1934  Office Visit Note: Visit Date: 01/08/2016 PCP: Vic Blackbird, MD Referred by: Alycia Rossetti, MD  Subjective: Chief Complaint  Patient presents with  . Left Knee - Pain   HPI: Debra [name] is a 81 year old female that I saw last year and completed facet joint injections at L4-5 with a known listhesis of L4 on L5 that at that time seemed to help her low back and her knee painat the time. She comes in today with a several week history of worsening left knee pain and it was worse with weightbearing. Her husband was in the hospital and she was walking the floors a lot and had increased pain since that time. She said it was very severe at one point and she did take a few pain pills and has been taking some anti-inflammatory. It has gotten a little bit better but is still problematic. She states it was worse when she rotated her hip in and out. She saw Dr. Ninfa Linden in our office back in March and he evaluated her and felt like the knees were not really an issue and that it was more of the lumbar spine. She was directed this time to go back to see him but she wanted to see [place] for an injection in the back because it seemed to help. She did go to urgent care in the interim and they did complete an intra-articular injection of the left knee without any help at all.    Left knee pain. Had knee injections a few weeks ago with no relief. Per patient's daughter bilateral L4- 5 facet injection 04/01/15 relieved similar symptoms. Per patient  the pain is more in the knee now. No numbness or tingling, no back pain. Difficulty bearing weight. Review of Systems  Constitutional: Negative for chills, fever, malaise/fatigue and weight loss.  HENT: Negative for hearing loss and sinus pain.   Eyes: Negative for blurred vision, double vision and photophobia.  Respiratory: Negative for cough and shortness of breath.   Cardiovascular:  Negative for chest pain, palpitations and leg swelling.  Gastrointestinal: Negative for abdominal pain, nausea and vomiting.  Genitourinary: Negative for flank pain, frequency and urgency.  Musculoskeletal: Negative for myalgias.  Skin: Negative for itching and rash.  Neurological: Negative for tremors, focal weakness and weakness.  Endo/Heme/Allergies: Negative.   Psychiatric/Behavioral: Negative for depression and suicidal ideas.  All other systems reviewed and are negative.  Otherwise per HPI.  Assessment & Plan: Visit Diagnoses:  1. Chronic pain of left knee     Plan: Findings:  Chronic worsening severe left knee pain which seems to be more medial joint line and possibly L4 radicular pain. She doesve any pain going down the leg and we asked that on several occasions. It is worse with weightbearing. Knee exam today is really nonfocal. Knee injection in urgent care was not very beneficial. Prior injection which was a facet joint blocthesis of L4 on L5 seem to help quite a bit back in March. She feels like the pain is similar. We did look at hip xcause I did get some pain with rotation of her hip but her hip x-rays appear fairly normal with some mild arthritis. I think at this point the best approach is to go ahead and repeat an injection of the L4-5 facet joint on the left. If that is not beneficial I'll would look at an L4 transforaminal injection. If neither one  of those are helpful but I think returning to see Dr. Ninfa Linden is the best option to review why her knee would be bothering her.    Meds & Orders: No orders of the defined types were placed in this encounter.   Orders Placed This Encounter  Procedures  . XR HIP UNILAT W OR W/O PELVIS 1V LEFT    Follow-up: No Follow-up on file.   Procedures: No procedures performed  No notes on file   Clinical History: No specialty comments available.  She reports that she has never smoked. She has never used smokeless tobacco. No results  for input(s): HGBA1C, LABURIC in the last 8760 hours.  Objective:  VS:  HT:    WT:   BMI:     BP:(!) 172/86  HR:60bpm  TEMP: ( )  RESP:  Physical Exam  Constitutional: She is oriented to person, place, and time. She appears well-developed and well-nourished.  Eyes: Conjunctivae and EOM are normal. Pupils are equal, round, and reactive to light.  Cardiovascular: Normal rate and intact distal pulses.   Pulmonary/Chest: Effort normal.  Musculoskeletal:  Examination of the left knee shows no swelling no joint line tenderness with good varus and valgus stability. Negative anterior drawer. She has some crepitus with movement. Hip joint does show reduced range of motion in internal and external. Gets some pain in the knee with rotation of the hip but no groin pain. She has good distal strength.  Neurological: She is alert and oriented to person, place, and time.  Skin: Skin is warm and dry. No erythema.  Psychiatric: She has a normal mood and affect. Her behavior is normal.  Nursing note and vitals reviewed.   Ortho Exam Imaging: Xr Hip Unilat W Or W/o Pelvis 1v Left  Result Date: 01/08/2016 Pelvis and one view of the left hip shows mild symmetric arthritis with some superior spurring but very mild. There is no fractures no dislocations.   Past Medical/Family/Surgical/Social History: Medications & Allergies reviewed per EMR Patient Active Problem List   Diagnosis Date Noted  . Peripheral edema 05/22/2014  . Pelvic pain in female 02/06/2014  . Meningioma ([place]) 04/29/2012  . Vestibular schwannoma ([place]) 03/26/2012  . Vertigo 03/24/2012  . Vitamin D deficiency 09/24/2009  . Vitamin B12 deficiency 07/05/2009  . Leukocytopenia 10/26/2008  . OSTEOPENIA 10/16/2008  . ESSENTIAL HYPERTENSION, BENIGN 09/05/2008  . MELANOMA, HX OF 09/05/2008  . ISCHEMIC COLITIS, HX OF 05/26/2007  . HYPERCHOLESTEROLEMIA, PURE 08/20/2006  . GLAUCOMA NOS 04/21/2006  . PSVT 04/21/2006  . FEMALE STRESS  INCONTINENCE 04/21/2006  . DEGENERATIVE DISC DISEASE, LUMBAR SPINE 04/21/2006  . LOW BACK PAIN, CHRONIC 04/21/2006  . PUD, HX OF 04/21/2006  . DIVERTICULOSIS, COLON W/HEM 12/01/2005   Past Medical History:  Diagnosis Date  . Brain mass    monitor by Dr Trenton Gammon - right posterior fossa meningioma - thinks it's benign per patient  . Cancer (HCC)    hand, face - spots removed  . Constipation   . Degeneration of lumbar or lumbosacral intervertebral disc    arthritis in back, hips and hands - otc med prn  . Diarrhea   . Diverticulosis of colon with hemorrhage   . Dysrhythmia    Hx irregular heart beat - 15 yrs ago  . Female stress incontinence   . Headache    otc med prn  . Hypertension   . Lumbago   . Missed abortion    x 2 - no surgery required  . Paroxysmal supraventricular  tachycardia ([place])    Hx  . PONV (postoperative nausea and vomiting)   . Pure hypercholesterolemia   . SVD (spontaneous vaginal delivery)    x 4  . Unspecified glaucoma(365.9)    bilateral   Family History  Problem Relation Age of Onset  . Aneurysm Mother   . Alzheimer's disease Mother   . Cancer Father     prostate  . Cancer Sister   . Arthritis Sister   . Heart disease Brother   . Hypertension Brother   . Glaucoma Brother    Past Surgical History:  Procedure Laterality Date  . ABDOMINAL HYSTERECTOMY    . APPENDECTOMY    . COLONOSCOPY    . EYE SURGERY     bilateral cataract eye surgery  . EYE SURGERY     laser to relieve eye pressure - bilateral  . LAPAROTOMY N/A 02/06/2014   Procedure: EXPLORATORY LAPAROTOMY;  Surgeon: Lavonia Drafts, MD;  Location: [facility];  Service: Gynecology;  Laterality: N/A;  . MULTIPLE TOOTH EXTRACTIONS     upper  . right hand surgery     ganglion cyst removed  . TUBAL LIGATION     Social History   Occupational History  . raised tobacco Retired   Social History Main Topics  . Smoking status: Never Smoker  . Smokeless tobacco: Never Used  . Alcohol  use No  . Drug use: No  . Sexual activity: Not Currently    Birth control/ protection: None, Post-menopausal, Surgical

## 2016-01-09 ENCOUNTER — Encounter (INDEPENDENT_AMBULATORY_CARE_PROVIDER_SITE_OTHER): Payer: Self-pay | Admitting: Physical Medicine and Rehabilitation

## 2016-01-09 ENCOUNTER — Ambulatory Visit (INDEPENDENT_AMBULATORY_CARE_PROVIDER_SITE_OTHER): Payer: Medicare Other | Admitting: Physical Medicine and Rehabilitation

## 2016-01-09 VITALS — BP 146/70 | HR 62 | Temp 98.3°F

## 2016-01-09 DIAGNOSIS — M47816 Spondylosis without myelopathy or radiculopathy, lumbar region: Secondary | ICD-10-CM

## 2016-01-09 MED ORDER — METHYLPREDNISOLONE ACETATE 80 MG/ML IJ SUSP
80.0000 mg | Freq: Once | INTRAMUSCULAR | Status: AC
  Administered 2016-01-09: 80 mg

## 2016-01-09 MED ORDER — LIDOCAINE HCL (PF) 1 % IJ SOLN: 0.3300 mL | Freq: Once | INTRAMUSCULAR | Status: DC

## 2016-01-09 MED ORDER — BUPIVACAINE HCL 0.5 % IJ SOLN
50.0000 mL | Freq: Once | INTRAMUSCULAR | Status: AC
  Administered 2016-01-09: 50 mL

## 2016-01-09 NOTE — Progress Notes (Signed)
Debra [name] - 81 y.o. female MRN [id number]  Date of birth: 1934-10-23  Office Visit Note: Visit Date: 01/09/2016 PCP: Vic Blackbird, MD Referred by: Alycia Rossetti, MD  Subjective: Chief Complaint  Patient presents with  . Lower Back - Pain   HPI: Debra [name] is a 81 year old female who is is here today for planned facet injection. No change in symptoms. Please see our prior evaluation and management note for further details and justification.  Has driver-daughter    ROS Otherwise per HPI.  Assessment & Plan: Visit Diagnoses:  1. Spondylosis without myelopathy or radiculopathy, lumbar region     Plan: Findings:  Plan as above in history of present illness    Meds & Orders:  Meds ordered this encounter  Medications  . lidocaine (PF) (XYLOCAINE) 1 % injection 0.3 mL  . bupivacaine (MARCAINE) 0.5 % (with pres) injection 50 mL  . methylPREDNISolone acetate (DEPO-MEDROL) injection 80 mg    Orders Placed This Encounter  Procedures  . Nerve Block    Follow-up: Return if symptoms worsen or fail to improve.   Procedures: No procedures performed  Lumbar Facet Joint Intra-Articular Injection(s) with Fluoroscopic Guidance  Patient: Debra [name]      Date of Birth: Apr 22, 1934 MRN: [id number] PCP: Vic Blackbird, MD      Visit Date: 01/09/2016   Universal Protocol:    Date/Time: 11/09/171:41 PM  Consent Given By: the patient  Position: PRONE   Additional Comments: Vital signs were monitored before and after the procedure. Patient was prepped and draped in the usual sterile fashion. The correct patient, procedure, and site was verified.   Injection Procedure Details:  Procedure Site One Meds Administered:  Meds ordered this encounter  Medications  . lidocaine (PF) (XYLOCAINE) 1 % injection 0.3 mL  . bupivacaine (MARCAINE) 0.5 % (with pres) injection 50 mL  . methylPREDNISolone acetate (DEPO-MEDROL) injection 80 mg     Laterality:  Left  Location/Site:  L4-L5  Needle size: 22 guage  Needle type: Spinal  Needle Placement: Articular  Findings:  -Contrast Used: 1 mL iohexol 180 mg iodine/mL   -Comments: Excellent flow of contrast producing a partial arthrogram.  Procedure Details: The fluoroscope beam is vertically oriented in AP, and the inferior recess is visualized beneath the lower pole of the inferior apophyseal process, which represents the target point for needle insertion. When direct visualization is difficult the target point is located at the medial projection of the vertebral pedicle. The region overlying each aforementioned target is locally anesthetized with a 1 to 2 ml. volume of 1% Lidocaine without Epinephrine.   The spinal needle was inserted into each of the above mentioned facet joints using biplanar fluoroscopic guidance. A 0.25 to 0.5 ml. volume of Isovue-250 was injected and a partial facet joint arthrogram was obtained. A single spot film was obtained of the resulting arthrogram.    One to 1.25 ml of the steroid/anesthetic solution was then injected into each of the facet joints noted above.   Additional Comments:  The patient tolerated the procedure well Dressing: Band-Aid    Post-procedure details: Patient was observed during the procedure. Post-procedure instructions were reviewed.  Patient left the clinic in stable condition.        Clinical History: No specialty comments available.  She reports that she has never smoked. She has never used smokeless tobacco. No results for input(s): HGBA1C, LABURIC in the last 8760 hours.  Objective:  VS:  HT:    WT:  BMI:     BP:(!) 146/70  HR:62bpm  TEMP:98.3 F (36.8 C)( )  RESP:  Physical Exam  Musculoskeletal:  Patient was good distal strength and no swelling of the knees. She has concordant back pain although is minimal.    Ortho Exam Imaging: No results found.  Past Medical/Family/Surgical/Social History: Medications &  Allergies reviewed per EMR Patient Active Problem List   Diagnosis Date Noted  . Peripheral edema 05/22/2014  . Pelvic pain in female 02/06/2014  . Meningioma ([place]) 04/29/2012  . Vestibular schwannoma ([place]) 03/26/2012  . Vertigo 03/24/2012  . Vitamin D deficiency 09/24/2009  . Vitamin B12 deficiency 07/05/2009  . Leukocytopenia 10/26/2008  . OSTEOPENIA 10/16/2008  . Essential hypertension, benign 09/05/2008  . MELANOMA, HX OF 09/05/2008  . ISCHEMIC COLITIS, HX OF 05/26/2007  . HYPERCHOLESTEROLEMIA, PURE 08/20/2006  . GLAUCOMA NOS 04/21/2006  . PSVT 04/21/2006  . FEMALE STRESS INCONTINENCE 04/21/2006  . DEGENERATIVE DISC DISEASE, LUMBAR SPINE 04/21/2006  . LOW BACK PAIN, CHRONIC 04/21/2006  . PUD, HX OF 04/21/2006  . DIVERTICULOSIS, COLON W/HEM 12/01/2005   Past Medical History:  Diagnosis Date  . Brain mass    monitor by Dr Trenton Gammon - right posterior fossa meningioma - thinks it's benign per patient  . Cancer (HCC)    hand, face - spots removed  . Constipation   . Degeneration of lumbar or lumbosacral intervertebral disc    arthritis in back, hips and hands - otc med prn  . Diarrhea   . Diverticulosis of colon with hemorrhage   . Dysrhythmia    Hx irregular heart beat - 15 yrs ago  . Female stress incontinence   . Headache    otc med prn  . Hypertension   . Lumbago   . Missed abortion    x 2 - no surgery required  . Paroxysmal supraventricular tachycardia (HCC)    Hx  . PONV (postoperative nausea and vomiting)   . Pure hypercholesterolemia   . SVD (spontaneous vaginal delivery)    x 4  . Unspecified glaucoma(365.9)    bilateral   Family History  Problem Relation Age of Onset  . Aneurysm Mother   . Alzheimer's disease Mother   . Cancer Father     prostate  . Cancer Sister   . Arthritis Sister   . Heart disease Brother   . Hypertension Brother   . Glaucoma Brother    Past Surgical History:  Procedure Laterality Date  . ABDOMINAL HYSTERECTOMY    .  APPENDECTOMY    . COLONOSCOPY    . EYE SURGERY     bilateral cataract eye surgery  . EYE SURGERY     laser to relieve eye pressure - bilateral  . LAPAROTOMY N/A 02/06/2014   Procedure: EXPLORATORY LAPAROTOMY;  Surgeon: Lavonia Drafts, MD;  Location: [facility];  Service: Gynecology;  Laterality: N/A;  . MULTIPLE TOOTH EXTRACTIONS     upper  . right hand surgery     ganglion cyst removed  . TUBAL LIGATION     Social History   Occupational History  . raised tobacco Retired   Social History Main Topics  . Smoking status: Never Smoker  . Smokeless tobacco: Never Used  . Alcohol use No  . Drug use: No  . Sexual activity: Not Currently    Birth control/ protection: None, Post-menopausal, Surgical

## 2016-01-09 NOTE — Procedures (Signed)
Lumbar Facet Joint Intra-Articular Injection(s) with Fluoroscopic Guidance  Patient: Debra [name]      Date of Birth: Mar 10, 1934 MRN: [id number] PCP: Vic Blackbird, MD      Visit Date: 01/09/2016   Universal Protocol:    Date/Time: 11/09/171:41 PM  Consent Given By: the patient  Position: PRONE   Additional Comments: Vital signs were monitored before and after the procedure. Patient was prepped and draped in the usual sterile fashion. The correct patient, procedure, and site was verified.   Injection Procedure Details:  Procedure Site One Meds Administered:  Meds ordered this encounter  Medications  . lidocaine (PF) (XYLOCAINE) 1 % injection 0.3 mL  . bupivacaine (MARCAINE) 0.5 % (with pres) injection 50 mL  . methylPREDNISolone acetate (DEPO-MEDROL) injection 80 mg     Laterality: Left  Location/Site:  L4-L5  Needle size: 22 guage  Needle type: Spinal  Needle Placement: Articular  Findings:  -Contrast Used: 1 mL iohexol 180 mg iodine/mL   -Comments: Excellent flow of contrast producing a partial arthrogram.  Procedure Details: The fluoroscope beam is vertically oriented in AP, and the inferior recess is visualized beneath the lower pole of the inferior apophyseal process, which represents the target point for needle insertion. When direct visualization is difficult the target point is located at the medial projection of the vertebral pedicle. The region overlying each aforementioned target is locally anesthetized with a 1 to 2 ml. volume of 1% Lidocaine without Epinephrine.   The spinal needle was inserted into each of the above mentioned facet joints using biplanar fluoroscopic guidance. A 0.25 to 0.5 ml. volume of Isovue-250 was injected and a partial facet joint arthrogram was obtained. A single spot film was obtained of the resulting arthrogram.    One to 1.25 ml of the steroid/anesthetic solution was then injected into each of the facet joints noted  above.   Additional Comments:  The patient tolerated the procedure well Dressing: Band-Aid    Post-procedure details: Patient was observed during the procedure. Post-procedure instructions were reviewed.  Patient left the clinic in stable condition.

## 2016-01-09 NOTE — Patient Instructions (Signed)

## 2016-01-10 ENCOUNTER — Encounter: Payer: Self-pay | Admitting: Family Medicine

## 2016-01-10 ENCOUNTER — Ambulatory Visit (INDEPENDENT_AMBULATORY_CARE_PROVIDER_SITE_OTHER): Payer: Medicare Other | Admitting: Family Medicine

## 2016-01-10 VITALS — BP 140/72 | HR 70 | Temp 98.3°F | Resp 16 | Ht 63.0 in | Wt 175.0 lb

## 2016-01-10 DIAGNOSIS — M5137 Other intervertebral disc degeneration, lumbosacral region: Secondary | ICD-10-CM

## 2016-01-10 DIAGNOSIS — E538 Deficiency of other specified B group vitamins: Secondary | ICD-10-CM | POA: Diagnosis not present

## 2016-01-10 DIAGNOSIS — I10 Essential (primary) hypertension: Secondary | ICD-10-CM

## 2016-01-10 DIAGNOSIS — E7800 Pure hypercholesterolemia, unspecified: Secondary | ICD-10-CM | POA: Diagnosis not present

## 2016-01-10 MED ORDER — CLONIDINE HCL 0.1 MG PO TABS: ORAL_TABLET | ORAL | 2 refills | Status: DC

## 2016-01-10 MED ORDER — TRAMADOL HCL 50 MG PO TABS: 50.0000 mg | ORAL_TABLET | Freq: Two times a day (BID) | ORAL | 1 refills | Status: DC | PRN

## 2016-01-10 MED ORDER — LISINOPRIL 40 MG PO TABS: 40.0000 mg | ORAL_TABLET | Freq: Every day | ORAL | 3 refills | Status: DC

## 2016-01-10 MED ORDER — PRAVASTATIN SODIUM 80 MG PO TABS: 80.0000 mg | ORAL_TABLET | Freq: Every day | ORAL | 2 refills | Status: DC

## 2016-01-10 MED ORDER — ATENOLOL 50 MG PO TABS: 50.0000 mg | ORAL_TABLET | Freq: Every day | ORAL | 1 refills | Status: DC

## 2016-01-10 NOTE — Progress Notes (Signed)
   Subjective:    Patient ID: Debra [name], female    DOB: 30-Apr-1934, 81 y.o.   MRN: [id number]  Patient presents for Follow-up and VIt B Injection    Patient here for follow-up  She is history of hypertension hyperlipidemia she is taking her medicines as prescribed  B-12 deficiency she is due for her B-12 shot   She continues to have problems with the degenerative disc disease in her spine as well as some left knee pain. She is being followed by orthopedics for this. She had a recent epidural injection which is helped some. She has tramadol on her list for pain but has not been taking this regularly. She's been taking Tylenol.  In taking all her blood pressure medication as prescribed her blood pressure has been good  He is spending most of her time taking care of her husband who has been very ill he recently was hospitalized for 3 weeks   Review Of Systems:  GEN- denies fatigue, fever, weight loss,weakness, recent illness HEENT- denies eye drainage, change in vision, nasal discharge, CVS- denies chest pain, palpitations RESP- denies SOB, cough, wheeze ABD- denies N/V, change in stools, abd pain GU- denies dysuria, hematuria, dribbling, incontinence MSK- + joint pain, muscle aches, injury Neuro- denies headache, dizziness, syncope, seizure activity       Objective:    BP 140/72 (BP Location: Left Arm, Patient Position: Sitting, Cuff Size: Normal)   Pulse 70   Temp 98.3 F (36.8 C) (Oral)   Resp 16   Ht 5\' 3"  (1.6 m)   Wt 175 lb (79.4 kg)   SpO2 94%   BMI 31.00 kg/m  GEN- NAD, alert and oriented x3 HEENT- PERRL, EOMI, non injected sclera, pink conjunctiva, MMM, oropharynx clear Neck- Supple, no thyromegaly CVS- RRR, no murmur RESP-CTAB ABD-NABS,soft,NT,ND EXT- pedal edema Pulses- Radia  2+        Assessment & Plan:      Problem List Items Addressed This Visit    Vitamin B12 deficiency - Primary    B-12 shot given today we'll recheck her level      Relevant Orders   Vitamin B12   HYPERCHOLESTEROLEMIA, PURE    Currently on pravastatin and recheck lipid panel and liver function      Relevant Medications   atenolol (TENORMIN) 50 MG tablet   cloNIDine (CATAPRES) 0.1 MG tablet   lisinopril (PRINIVIL,ZESTRIL) 40 MG tablet   pravastatin (PRAVACHOL) 80 MG tablet   Essential hypertension, benign    Blood pressure looks okay for her age she is on multiple medications      Relevant Medications   atenolol (TENORMIN) 50 MG tablet   cloNIDine (CATAPRES) 0.1 MG tablet   lisinopril (PRINIVIL,ZESTRIL) 40 MG tablet   pravastatin (PRAVACHOL) 80 MG tablet   Other Relevant Orders   CBC with Differential/Platelet   Comprehensive metabolic panel   DEGENERATIVE DISC DISEASE, LUMBAR SPINE    Tramadol refilled these okay to use the Tylenol as well.      Relevant Medications   traMADol (ULTRAM) 50 MG tablet      Note: This dictation was prepared with Dragon dictation along with smaller phrase technology. Any transcriptional errors that result from this process are unintentional.

## 2016-01-10 NOTE — Assessment & Plan Note (Signed)
Tramadol refilled these okay to use the Tylenol as well.

## 2016-01-10 NOTE — Assessment & Plan Note (Signed)
Currently on pravastatin and recheck lipid panel and liver function

## 2016-01-10 NOTE — Assessment & Plan Note (Signed)
B-12 shot given today we'll recheck her level

## 2016-01-10 NOTE — Patient Instructions (Addendum)
F/U 4  months Physical  We will call with lab results  B12 shot given

## 2016-01-10 NOTE — Assessment & Plan Note (Signed)
Blood pressure looks okay for her age she is on multiple medications

## 2016-01-11 ENCOUNTER — Other Ambulatory Visit: Payer: Self-pay | Admitting: Family Medicine

## 2016-01-11 LAB — CBC WITH DIFFERENTIAL/PLATELET
BASOS ABS: 0 {cells}/uL (ref 0–200)
Basophils Relative: 0 %
EOS PCT: 1 %
Eosinophils Absolute: 67 cells/uL (ref 15–500)
HEMATOCRIT: 41.8 % (ref 35.0–45.0)
HEMOGLOBIN: 14 g/dL (ref 12.0–15.0)
LYMPHS ABS: 1407 {cells}/uL (ref 850–3900)
Lymphocytes Relative: 21 %
MCH: 29.4 pg (ref 27.0–33.0)
MCHC: 33.5 g/dL (ref 32.0–36.0)
MCV: 87.6 fL (ref 80.0–100.0)
MONO ABS: 268 {cells}/uL (ref 200–950)
MPV: 9.3 fL (ref 7.5–12.5)
Monocytes Relative: 4 %
NEUTROS ABS: 4958 {cells}/uL (ref 1500–7800)
NEUTROS PCT: 74 %
Platelets: 203 10*3/uL (ref 140–400)
RBC: 4.77 MIL/uL (ref 3.80–5.10)
RDW: 14.1 % (ref 11.0–15.0)
WBC: 6.7 10*3/uL (ref 3.8–10.8)

## 2016-01-11 LAB — COMPREHENSIVE METABOLIC PANEL
ALBUMIN: 4.2 g/dL (ref 3.6–5.1)
ALT: 12 U/L (ref 6–29)
AST: 15 U/L (ref 10–35)
Alkaline Phosphatase: 75 U/L (ref 33–130)
BILIRUBIN TOTAL: 0.5 mg/dL (ref 0.2–1.2)
BUN: 20 mg/dL (ref 7–25)
CALCIUM: 9.3 mg/dL (ref 8.6–10.4)
CO2: 25 mmol/L (ref 20–31)
CREATININE: 0.66 mg/dL (ref 0.60–0.93)
Chloride: 103 mmol/L (ref 98–110)
Glucose, Bld: 98 mg/dL (ref 70–99)
Potassium: 4 mmol/L (ref 3.5–5.3)
SODIUM: 140 mmol/L (ref 135–146)
TOTAL PROTEIN: 6.7 g/dL (ref 6.1–8.1)

## 2016-01-11 LAB — VITAMIN B12

## 2016-01-13 ENCOUNTER — Other Ambulatory Visit: Payer: Self-pay | Admitting: Family Medicine

## 2016-01-14 ENCOUNTER — Encounter (INDEPENDENT_AMBULATORY_CARE_PROVIDER_SITE_OTHER): Payer: Medicare Other | Admitting: Physical Medicine and Rehabilitation

## 2016-02-18 DIAGNOSIS — H40153 Residual stage of open-angle glaucoma, bilateral: Secondary | ICD-10-CM | POA: Diagnosis not present

## 2016-03-04 ENCOUNTER — Other Ambulatory Visit: Payer: Self-pay | Admitting: Family Medicine

## 2016-03-04 MED ORDER — ATENOLOL 50 MG PO TABS: 50.0000 mg | ORAL_TABLET | Freq: Every day | ORAL | 1 refills | Status: DC

## 2016-03-09 ENCOUNTER — Other Ambulatory Visit: Payer: Medicare Other

## 2016-03-09 ENCOUNTER — Other Ambulatory Visit: Payer: Self-pay | Admitting: Emergency Medicine

## 2016-03-09 ENCOUNTER — Ambulatory Visit: Payer: Medicare Other | Admitting: Hematology and Oncology

## 2016-03-09 DIAGNOSIS — D72819 Decreased white blood cell count, unspecified: Secondary | ICD-10-CM

## 2016-03-09 NOTE — Assessment & Plan Note (Signed)
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 Kahn and being treated with once a month vitamin B 12 1000 g injections at least since April 2012. I reviewed recent blood counts and they appeared to be normal. Hemoglobin level and MCV are also normal. Hence at this point we decided to continue on with the same treatment and we will see her annually for follow-up. 

## 2016-03-10 ENCOUNTER — Other Ambulatory Visit (HOSPITAL_BASED_OUTPATIENT_CLINIC_OR_DEPARTMENT_OTHER): Payer: Medicare Other

## 2016-03-10 ENCOUNTER — Ambulatory Visit (HOSPITAL_BASED_OUTPATIENT_CLINIC_OR_DEPARTMENT_OTHER): Payer: Medicare Other | Admitting: Hematology and Oncology

## 2016-03-10 ENCOUNTER — Encounter: Payer: Self-pay | Admitting: Hematology and Oncology

## 2016-03-10 DIAGNOSIS — E538 Deficiency of other specified B group vitamins: Secondary | ICD-10-CM | POA: Diagnosis not present

## 2016-03-10 DIAGNOSIS — D72819 Decreased white blood cell count, unspecified: Secondary | ICD-10-CM

## 2016-03-10 LAB — CBC WITH DIFFERENTIAL/PLATELET
BASO%: 0.5 % (ref 0.0–2.0)
BASOS ABS: 0 10*3/uL (ref 0.0–0.1)
EOS ABS: 0.2 10*3/uL (ref 0.0–0.5)
EOS%: 4.8 % (ref 0.0–7.0)
HEMATOCRIT: 39.6 % (ref 34.8–46.6)
HEMOGLOBIN: 13.4 g/dL (ref 11.6–15.9)
LYMPH#: 1.1 10*3/uL (ref 0.9–3.3)
LYMPH%: 28.1 % (ref 14.0–49.7)
MCH: 29.6 pg (ref 25.1–34.0)
MCHC: 33.8 g/dL (ref 31.5–36.0)
MCV: 87.6 fL (ref 79.5–101.0)
MONO#: 0.3 10*3/uL (ref 0.1–0.9)
MONO%: 7.3 % (ref 0.0–14.0)
NEUT#: 2.3 10*3/uL (ref 1.5–6.5)
NEUT%: 59.3 % (ref 38.4–76.8)
Platelets: 161 10*3/uL (ref 145–400)
RBC: 4.52 10*6/uL (ref 3.70–5.45)
RDW: 13.3 % (ref 11.2–14.5)
WBC: 4 10*3/uL (ref 3.9–10.3)

## 2016-03-10 LAB — COMPREHENSIVE METABOLIC PANEL
ALBUMIN: 3.9 g/dL (ref 3.5–5.0)
ALK PHOS: 75 U/L (ref 40–150)
ALT: 9 U/L (ref 0–55)
AST: 13 U/L (ref 5–34)
Anion Gap: 9 mEq/L (ref 3–11)
BILIRUBIN TOTAL: 0.71 mg/dL (ref 0.20–1.20)
BUN: 17 mg/dL (ref 7.0–26.0)
CALCIUM: 9.3 mg/dL (ref 8.4–10.4)
CO2: 26 mEq/L (ref 22–29)
Chloride: 107 mEq/L (ref 98–109)
Creatinine: 0.8 mg/dL (ref 0.6–1.1)
EGFR: 67 mL/min/{1.73_m2} — ABNORMAL LOW (ref >90)
Glucose: 97 mg/dl (ref 70–140)
POTASSIUM: 3.7 meq/L (ref 3.5–5.1)
Sodium: 142 mEq/L (ref 136–145)
TOTAL PROTEIN: 6.7 g/dL (ref 6.4–8.3)

## 2016-03-10 NOTE — Progress Notes (Signed)
Patient Care Team: Alycia Rossetti, MD as PCP - General (Family Medicine)  DIAGNOSIS:  Encounter Diagnosis  Name Primary?  . Vitamin B12 deficiency     CHIEF COMPLIANT: Follow-up of B-12 deficiency related leukopenia  INTERVAL HISTORY: Debra [name] is a 75 year with above-mentioned history of B-12 deficiency. Related leukopenia. She has been on B-12 injections since 2012 being given by her primary care physician. She is doing quite well apart from arthritis.  REVIEW OF SYSTEMS:   Constitutional: Denies fevers, chills or abnormal weight loss Eyes: Denies blurriness of vision Ears, nose, mouth, throat, and face: Denies mucositis or sore throat, hearing loss Respiratory: Denies cough, dyspnea or wheezes Cardiovascular: Denies palpitation, chest discomfort Gastrointestinal:  Denies nausea, heartburn or change in bowel habits Skin: Denies abnormal skin rashes Lymphatics: Denies new lymphadenopathy or easy bruising Neurological:Denies numbness, tingling or new weaknesses Behavioral/Psych: Mood is stable, no new changes  Extremities: No lower extremity edema  All other systems were reviewed with the patient and are negative.  I have reviewed the past medical history, past surgical history, social history and family history with the patient and they are unchanged from previous note.  ALLERGIES:  is allergic to codeine phosphate; sulfa antibiotics; and latex.  MEDICATIONS:  Current Outpatient Prescriptions  Medication Sig Dispense Refill  . amLODipine (NORVASC) 10 MG tablet Take 1 tablet (10 mg total) by mouth daily. 30 tablet 5  . amLODipine (NORVASC) 10 MG tablet Take 1 tablet (10 mg total) by mouth daily. 30 tablet 5  . atenolol (TENORMIN) 50 MG tablet Take 1 tablet (50 mg total) by mouth daily. 90 tablet 1  . brimonidine (ALPHAGAN) 0.2 % ophthalmic solution Place 1 drop into both eyes 2 (two) times daily.    . cholecalciferol (VITAMIN D) 400 UNITS TABS tablet Take 400  Units by mouth daily. Reported on 06/05/2015    . cloNIDine (CATAPRES) 0.1 MG tablet TAKE 1 TABLET IN AM AND 2 TABLETS AT BEDTIME 270 tablet 2  . furosemide (LASIX) 40 MG tablet Take 1 tablet (40 mg total) by mouth daily as needed. 30 tablet 6  . latanoprost (XALATAN) 0.005 % ophthalmic solution Place 1 drop into both eyes at bedtime.     Marland Kitchen lisinopril (PRINIVIL,ZESTRIL) 40 MG tablet Take 1 tablet (40 mg total) by mouth daily. 90 tablet 3  . loperamide (IMODIUM) 2 MG capsule Take 2 capsules (4 mg total) by mouth 2 (two) times daily as needed for diarrhea or loose stools. 30 capsule 1  . potassium chloride SA (K-DUR,KLOR-CON) 20 MEQ tablet Take with lasix 30 tablet 6  . pravastatin (PRAVACHOL) 80 MG tablet Take 1 tablet (80 mg total) by mouth daily. 90 tablet 2  . prochlorperazine (COMPAZINE) 10 MG tablet Take 1 tablet (10 mg total) by mouth every 8 (eight) hours as needed for nausea or vomiting. 30 tablet 0  . timolol (TIMOPTIC) 0.5 % ophthalmic solution Place 1 drop into both eyes 2 (two) times daily.     . traMADol (ULTRAM) 50 MG tablet Take 1 tablet (50 mg total) by mouth 2 (two) times daily as needed. 30 tablet 1  . triamcinolone cream (KENALOG) 0.1 % Apply 1 application topically 2 (two) times daily. 30 g 0   Current Facility-Administered Medications  Medication Dose Route Frequency Provider Last Rate Last Dose  . cyanocobalamin ((VITAMIN B-12)) injection 1,000 mcg  1,000 mcg Intramuscular Q30 days Alycia Rossetti, MD   1,000 mcg at 01/10/16 1443  . lidocaine (PF) (  XYLOCAINE) 1 % injection 0.3 mL  0.3 mL Other Once Magnus Sinning, MD        PHYSICAL EXAMINATION: ECOG PERFORMANCE STATUS: 1 - Symptomatic but completely ambulatory  Vitals:   03/10/16 1506  BP: (!) 167/50  Pulse: (!) 57  Resp: 18  Temp: 98.2 F (36.8 C)   Filed Weights   03/10/16 1506  Weight: 176 lb 6.4 oz (80 kg)    GENERAL:alert, no distress and comfortable SKIN: skin color, texture, turgor are normal, no  rashes or significant lesions EYES: normal, Conjunctiva are pink and non-injected, sclera clear OROPHARYNX:no exudate, no erythema and lips, buccal mucosa, and tongue normal  NECK: supple, thyroid normal size, non-tender, without nodularity LYMPH:  no palpable lymphadenopathy in the cervical, axillary or inguinal LUNGS: clear to auscultation and percussion with normal breathing effort HEART: regular rate & rhythm and no murmurs and no lower extremity edema ABDOMEN:abdomen soft, non-tender and normal bowel sounds MUSCULOSKELETAL:no cyanosis of digits and no clubbing  NEURO: alert & oriented x 3 with fluent speech, no focal motor/sensory deficits EXTREMITIES: No lower extremity edema  LABORATORY DATA:  I have reviewed the data as listed   Chemistry      Component Value Date/Time   NA 140 01/10/2016 1441   NA 140 03/07/2015 1516   K 4.0 01/10/2016 1441   K 4.1 03/07/2015 1516   CL 103 01/10/2016 1441   CL 103 06/04/2009 1057   CO2 25 01/10/2016 1441   CO2 24 03/07/2015 1516   BUN 20 01/10/2016 1441   BUN 17.1 03/07/2015 1516   CREATININE 0.66 01/10/2016 1441   CREATININE 1.0 03/07/2015 1516      Component Value Date/Time   CALCIUM 9.3 01/10/2016 1441   CALCIUM 9.4 03/07/2015 1516   ALKPHOS 75 01/10/2016 1441   ALKPHOS 73 03/07/2015 1516   AST 15 01/10/2016 1441   AST 14 03/07/2015 1516   ALT 12 01/10/2016 1441   ALT 12 03/07/2015 1516   BILITOT 0.5 01/10/2016 1441   BILITOT 0.53 03/07/2015 1516       Lab Results  Component Value Date   WBC 4.0 03/10/2016   HGB 13.4 03/10/2016   HCT 39.6 03/10/2016   MCV 87.6 03/10/2016   PLT 161 03/10/2016   NEUTROABS 2.3 03/10/2016    ASSESSMENT & PLAN:  Vitamin B12 deficiency Leukopenia associated with B-12 deficiency diagnosed by Dr. Chancy Milroy and being treated with once a month vitamin B 12 1000 g injections at least since April 2012.  I reviewed recent blood counts and they appeared to be normal. Hemoglobin level and MCV are  also normal. Hence at this point we decided to continue on with the same treatment. Patient gets B-12 injections by her primary care physician.  I discussed with the patient that she could be seen on an as-needed basis.   I spent 15 minutes talking to the patient of which more than half was spent in counseling and coordination of care.  No orders of the defined types were placed in this encounter.  The patient has a good understanding of the overall plan. she agrees with it. she will call with any problems that may develop before the next visit here.   Rulon Eisenmenger, MD 03/10/16

## 2016-03-17 ENCOUNTER — Ambulatory Visit (INDEPENDENT_AMBULATORY_CARE_PROVIDER_SITE_OTHER): Payer: Medicare Other

## 2016-03-17 DIAGNOSIS — E538 Deficiency of other specified B group vitamins: Secondary | ICD-10-CM

## 2016-04-17 ENCOUNTER — Ambulatory Visit (INDEPENDENT_AMBULATORY_CARE_PROVIDER_SITE_OTHER): Payer: Medicare Other | Admitting: *Deleted

## 2016-04-17 DIAGNOSIS — E538 Deficiency of other specified B group vitamins: Secondary | ICD-10-CM

## 2016-05-18 ENCOUNTER — Ambulatory Visit (INDEPENDENT_AMBULATORY_CARE_PROVIDER_SITE_OTHER): Payer: Medicare Other | Admitting: Family Medicine

## 2016-05-18 DIAGNOSIS — E538 Deficiency of other specified B group vitamins: Secondary | ICD-10-CM

## 2016-06-08 ENCOUNTER — Other Ambulatory Visit: Payer: Self-pay | Admitting: Family Medicine

## 2016-06-22 ENCOUNTER — Ambulatory Visit (INDEPENDENT_AMBULATORY_CARE_PROVIDER_SITE_OTHER): Payer: Medicare Other | Admitting: Family Medicine

## 2016-06-22 DIAGNOSIS — E538 Deficiency of other specified B group vitamins: Secondary | ICD-10-CM

## 2016-06-24 DIAGNOSIS — H40153 Residual stage of open-angle glaucoma, bilateral: Secondary | ICD-10-CM | POA: Diagnosis not present

## 2016-07-07 DIAGNOSIS — H838X3 Other specified diseases of inner ear, bilateral: Secondary | ICD-10-CM | POA: Diagnosis not present

## 2016-07-13 ENCOUNTER — Other Ambulatory Visit: Payer: Self-pay | Admitting: Family Medicine

## 2016-07-29 ENCOUNTER — Ambulatory Visit (INDEPENDENT_AMBULATORY_CARE_PROVIDER_SITE_OTHER): Payer: Medicare Other | Admitting: Family Medicine

## 2016-07-29 DIAGNOSIS — E538 Deficiency of other specified B group vitamins: Secondary | ICD-10-CM

## 2016-08-26 ENCOUNTER — Encounter: Payer: Self-pay | Admitting: Family Medicine

## 2016-08-26 ENCOUNTER — Ambulatory Visit (INDEPENDENT_AMBULATORY_CARE_PROVIDER_SITE_OTHER): Payer: Medicare Other | Admitting: Family Medicine

## 2016-08-26 VITALS — BP 138/64 | HR 82 | Temp 97.7°F | Resp 16 | Ht 63.0 in | Wt 176.0 lb

## 2016-08-26 DIAGNOSIS — M19042 Primary osteoarthritis, left hand: Secondary | ICD-10-CM

## 2016-08-26 DIAGNOSIS — E7800 Pure hypercholesterolemia, unspecified: Secondary | ICD-10-CM

## 2016-08-26 DIAGNOSIS — M170 Bilateral primary osteoarthritis of knee: Secondary | ICD-10-CM | POA: Insufficient documentation

## 2016-08-26 DIAGNOSIS — E538 Deficiency of other specified B group vitamins: Secondary | ICD-10-CM

## 2016-08-26 DIAGNOSIS — M19041 Primary osteoarthritis, right hand: Secondary | ICD-10-CM

## 2016-08-26 DIAGNOSIS — M19049 Primary osteoarthritis, unspecified hand: Secondary | ICD-10-CM | POA: Insufficient documentation

## 2016-08-26 DIAGNOSIS — I10 Essential (primary) hypertension: Secondary | ICD-10-CM

## 2016-08-26 LAB — CBC WITH DIFFERENTIAL/PLATELET
BASOS ABS: 35 {cells}/uL (ref 0–200)
Basophils Relative: 1 %
EOS PCT: 3 %
Eosinophils Absolute: 105 cells/uL (ref 15–500)
HCT: 41.3 % (ref 35.0–45.0)
Hemoglobin: 13.8 g/dL (ref 12.0–15.0)
Lymphocytes Relative: 28 %
Lymphs Abs: 980 cells/uL (ref 850–3900)
MCH: 28.8 pg (ref 27.0–33.0)
MCHC: 33.4 g/dL (ref 32.0–36.0)
MCV: 86.2 fL (ref 80.0–100.0)
MONOS PCT: 7 %
MPV: 9.2 fL (ref 7.5–12.5)
Monocytes Absolute: 245 cells/uL (ref 200–950)
NEUTROS ABS: 2135 {cells}/uL (ref 1500–7800)
Neutrophils Relative %: 61 %
PLATELETS: 176 10*3/uL (ref 140–400)
RBC: 4.79 MIL/uL (ref 3.80–5.10)
RDW: 14.7 % (ref 11.0–15.0)
WBC: 3.5 10*3/uL — ABNORMAL LOW (ref 3.8–10.8)

## 2016-08-26 MED ORDER — FUROSEMIDE 40 MG PO TABS: 40.0000 mg | ORAL_TABLET | Freq: Every day | ORAL | 1 refills | Status: DC | PRN

## 2016-08-26 MED ORDER — PRAVASTATIN SODIUM 80 MG PO TABS: 80.0000 mg | ORAL_TABLET | Freq: Every day | ORAL | 2 refills | Status: DC

## 2016-08-26 MED ORDER — LISINOPRIL 40 MG PO TABS: 40.0000 mg | ORAL_TABLET | Freq: Every day | ORAL | 3 refills | Status: DC

## 2016-08-26 MED ORDER — AMLODIPINE BESYLATE 10 MG PO TABS: ORAL_TABLET | ORAL | 3 refills | Status: DC

## 2016-08-26 MED ORDER — DICLOFENAC SODIUM 1 % TD GEL
TRANSDERMAL | 3 refills | Status: DC
Start: 2016-08-26 — End: 2019-05-03

## 2016-08-26 MED ORDER — CLONIDINE HCL 0.1 MG PO TABS: ORAL_TABLET | ORAL | 3 refills | Status: DC

## 2016-08-26 MED ORDER — ATENOLOL 50 MG PO TABS: 50.0000 mg | ORAL_TABLET | Freq: Every day | ORAL | 3 refills | Status: DC

## 2016-08-26 MED ORDER — TRAMADOL HCL 50 MG PO TABS: 50.0000 mg | ORAL_TABLET | Freq: Two times a day (BID) | ORAL | 1 refills | Status: DC | PRN

## 2016-08-26 NOTE — Patient Instructions (Addendum)
Call Dr. Annette Stable to see if time to come in for your brain scan  We will call with lab results F/U 4 months for Physical

## 2016-08-26 NOTE — Progress Notes (Signed)
   Subjective:    Patient ID: Debra [name], female    DOB: Jul 27, 1934, 82 y.o.   MRN: [id number]  Patient presents for Medication Management (is not fastng) and VIt B Injection   HTN- taking BP meds as prescribed, she has been very active,states BP has been running well at home    B12- due for B12 shot   Arthritis in hands and knees acting, takes tylenol   History of menigioma and swhannoma was followed by neurosurgery, thinks she is overdue for MRI but states she has been fine, no dizziness, no falls, no headaches   Follows with [facility] for hearing aides     Review Of Systems:  GEN- denies fatigue, fever, weight loss,weakness, recent illness HEENT- denies eye drainage, change in vision, nasal discharge, CVS- denies chest pain, palpitations RESP- denies SOB, cough, wheeze ABD- denies N/V, change in stools, abd pain GU- denies dysuria, hematuria, dribbling, incontinence MSK-+ joint pain, muscle aches, injury Neuro- denies headache, dizziness, syncope, seizure activity       Objective:    BP 138/64   Pulse 82   Temp 97.7 F (36.5 C) (Oral)   Resp 16   Ht 5\' 3"  (1.6 m)   Wt 176 lb (79.8 kg)   SpO2 96%   BMI 31.18 kg/m  GEN- NAD, alert and oriented x3 HEENT- PERRL, EOMI, non injected sclera, pink conjunctiva, MMM, oropharynx clear Neck- Supple, no thyromegaly CVS- RRR, no murmur RESP-CTAB ABD-NABS,soft,NT,ND MSK- curvuture of fingers bilat hands, nodules at DIP, fair ROM bilat knees, no effusion EXT- No edema Pulses- Radial 2+        Assessment & Plan:      Problem List Items Addressed This Visit    HYPERCHOLESTEROLEMIA, PURE   Relevant Medications   pravastatin (PRAVACHOL) 80 MG tablet   atenolol (TENORMIN) 50 MG tablet   lisinopril (PRINIVIL,ZESTRIL) 40 MG tablet   amLODipine (NORVASC) 10 MG tablet   cloNIDine (CATAPRES) 0.1 MG tablet   furosemide (LASIX) 40 MG tablet   Other Relevant Orders   Lipid panel (Completed)   Osteoarthritis, hand   Relevant Medications   traMADol (ULTRAM) 50 MG tablet   Vitamin B12 deficiency    Check B12, given shot today       Relevant Orders   Vitamin B12 (Completed)   Osteoarthritis of both knees    Trial of voltaren gel to use prn as well Ultram for severe pain      Relevant Medications   traMADol (ULTRAM) 50 MG tablet   Essential hypertension, benign - Primary    Well controlled no changes  Labs today       Relevant Medications   pravastatin (PRAVACHOL) 80 MG tablet   atenolol (TENORMIN) 50 MG tablet   lisinopril (PRINIVIL,ZESTRIL) 40 MG tablet   amLODipine (NORVASC) 10 MG tablet   cloNIDine (CATAPRES) 0.1 MG tablet   furosemide (LASIX) 40 MG tablet   Other Relevant Orders   CBC with Differential/Platelet (Completed)   Comprehensive metabolic panel (Completed)      Note: This dictation was prepared with Dragon dictation along with smaller phrase technology. Any transcriptional errors that result from this process are unintentional.

## 2016-08-27 ENCOUNTER — Encounter: Payer: Self-pay | Admitting: Family Medicine

## 2016-08-27 LAB — COMPREHENSIVE METABOLIC PANEL
ALT: 10 U/L (ref 6–29)
AST: 15 U/L (ref 10–35)
Albumin: 4.1 g/dL (ref 3.6–5.1)
Alkaline Phosphatase: 73 U/L (ref 33–130)
BUN: 16 mg/dL (ref 7–25)
CHLORIDE: 104 mmol/L (ref 98–110)
CO2: 25 mmol/L (ref 20–31)
CREATININE: 0.82 mg/dL (ref 0.60–0.88)
Calcium: 9.1 mg/dL (ref 8.6–10.4)
GLUCOSE: 91 mg/dL (ref 70–99)
Potassium: 4.7 mmol/L (ref 3.5–5.3)
SODIUM: 140 mmol/L (ref 135–146)
TOTAL PROTEIN: 6.4 g/dL (ref 6.1–8.1)
Total Bilirubin: 0.7 mg/dL (ref 0.2–1.2)

## 2016-08-27 LAB — VITAMIN B12

## 2016-08-27 LAB — LIPID PANEL
Cholesterol: 190 mg/dL (ref <200)
HDL: 44 mg/dL — ABNORMAL LOW (ref >50)
LDL CALC: 111 mg/dL — AB (ref <100)
Total CHOL/HDL Ratio: 4.3 Ratio (ref <5.0)
Triglycerides: 176 mg/dL — ABNORMAL HIGH (ref <150)
VLDL: 35 mg/dL — AB (ref <30)

## 2016-08-27 NOTE — Assessment & Plan Note (Signed)
Well controlled no changes  Labs today

## 2016-08-27 NOTE — Assessment & Plan Note (Signed)
Check B12, given shot today

## 2016-08-27 NOTE — Assessment & Plan Note (Signed)
Trial of voltaren gel to use prn as well Ultram for severe pain

## 2016-09-29 ENCOUNTER — Ambulatory Visit (INDEPENDENT_AMBULATORY_CARE_PROVIDER_SITE_OTHER): Payer: Medicare Other

## 2016-09-29 DIAGNOSIS — E538 Deficiency of other specified B group vitamins: Secondary | ICD-10-CM

## 2016-09-29 NOTE — Progress Notes (Signed)
Patient was seen in office for a b12 injection. Injection was given in the right deltoid patient tolerated well

## 2016-10-13 DIAGNOSIS — H40153 Residual stage of open-angle glaucoma, bilateral: Secondary | ICD-10-CM | POA: Diagnosis not present

## 2016-11-04 ENCOUNTER — Ambulatory Visit (INDEPENDENT_AMBULATORY_CARE_PROVIDER_SITE_OTHER): Payer: Medicare Other | Admitting: *Deleted

## 2016-11-04 DIAGNOSIS — E538 Deficiency of other specified B group vitamins: Secondary | ICD-10-CM

## 2016-11-04 NOTE — Progress Notes (Signed)
Patient seen in office for Vitamin B 12 injection.   Tolerated IM administration well.  

## 2016-12-01 ENCOUNTER — Encounter: Payer: Self-pay | Admitting: Family Medicine

## 2016-12-01 ENCOUNTER — Ambulatory Visit (INDEPENDENT_AMBULATORY_CARE_PROVIDER_SITE_OTHER): Payer: Medicare Other | Admitting: Family Medicine

## 2016-12-01 VITALS — BP 130/64 | HR 86 | Temp 98.4°F | Resp 18 | Ht 63.0 in | Wt 173.0 lb

## 2016-12-01 DIAGNOSIS — G588 Other specified mononeuropathies: Secondary | ICD-10-CM

## 2016-12-01 DIAGNOSIS — S149XXA Injury of unspecified nerves of neck, initial encounter: Secondary | ICD-10-CM

## 2016-12-01 DIAGNOSIS — M5030 Other cervical disc degeneration, unspecified cervical region: Secondary | ICD-10-CM

## 2016-12-01 DIAGNOSIS — G589 Mononeuropathy, unspecified: Secondary | ICD-10-CM

## 2016-12-01 MED ORDER — METHYLPREDNISOLONE 4 MG PO TBPK
ORAL_TABLET | ORAL | 0 refills | Status: DC
Start: 2016-12-01 — End: 2019-05-03

## 2016-12-01 MED ORDER — TRAMADOL HCL 50 MG PO TABS: 50.0000 mg | ORAL_TABLET | Freq: Two times a day (BID) | ORAL | 1 refills | Status: DC | PRN

## 2016-12-01 NOTE — Progress Notes (Signed)
   Subjective:    Patient ID: Debra [name], female    DOB: [birth year], 82 y.o.   MRN: [id number]  Patient presents for Neck pain (Pain is getting worse)  Sat started having neck pain into the left shoulder,  This has been reoccuring Problem for years. 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 Thursday she did 5 gallons of pairs she is left-handed she was peeling and then canning and preserving afterwards Friday she started having some discomfort into her neck and into the shoulder. She's been using Tylenol along with topical pain sprays and rubs. She now has a pain patch on. She ran out of her tramadol. She denies any tingling or numbness in her left hand but she does have discomfort when she tries to sleep. Denies any chest pain or shortness of breath. No other specific injury. She will at times still a crawling sensation that shoots from her neck area to her shoulder    Review Of Systems:  GEN- denies fatigue, fever, weight loss,weakness, recent illness HEENT- denies eye drainage, change in vision, nasal discharge, CVS- denies chest pain, palpitations RESP- denies SOB, cough, wheeze ABD- denies N/V, change in stools, abd pain GU- denies dysuria, hematuria, dribbling, incontinence MSK- + joint pain, muscle aches, injury Neuro- denies headache, dizziness, syncope, seizure activity       Objective:    BP 130/64   Pulse 86   Temp 98.4 F (36.9 C) (Oral)   Resp 18   Ht 5\' 3"  (1.6 m)   Wt 173 lb (78.5 kg)   BMI 30.65 kg/m  GEN- NAD, alert and oriented x3 HEENT- PERRL, EOMI, non injected sclera, pink conjunctiva, MMM, oropharynx clear Neck- Supple, decreased ROM, neg spurlings, TTP left paraspinals and across shoulder to mid delotid CVS- RRR, no murmur RESP-CTAB MSK- Fair ROM upper ext, normal tone UE, strength equal bilat, biceps in tact  EXT- No edema Pulses- Radial  2+        Assessment & Plan:      Problem  List Items Addressed This Visit    None    Visit Diagnoses    DDD (degenerative disc disease), cervical    -  Primary   known DDD in think she has mild pinched nerve, SHe does not want a lot of intervention, will try Medrol dosepak, Ultram, use topical pain patch, not improved Xray c spine again She also has orthopedist   Relevant Medications   methylPREDNISolone (MEDROL DOSEPAK) 4 MG TBPK tablet   traMADol (ULTRAM) 50 MG tablet   Pinched nerve in neck          Note: This dictation was prepared with Dragon dictation along with smaller phrase technology. Any transcriptional errors that result from this process are unintentional.

## 2016-12-01 NOTE — Patient Instructions (Signed)
F/U 1 week nurse visit for flu shot/B12 F/U November Physical

## 2016-12-14 ENCOUNTER — Ambulatory Visit (INDEPENDENT_AMBULATORY_CARE_PROVIDER_SITE_OTHER): Payer: Medicare Other | Admitting: Family Medicine

## 2016-12-14 DIAGNOSIS — E538 Deficiency of other specified B group vitamins: Secondary | ICD-10-CM | POA: Diagnosis not present

## 2016-12-14 DIAGNOSIS — Z23 Encounter for immunization: Secondary | ICD-10-CM | POA: Diagnosis not present

## 2017-01-19 ENCOUNTER — Ambulatory Visit (INDEPENDENT_AMBULATORY_CARE_PROVIDER_SITE_OTHER): Payer: Medicare Other

## 2017-01-19 DIAGNOSIS — E538 Deficiency of other specified B group vitamins: Secondary | ICD-10-CM | POA: Diagnosis not present

## 2017-01-19 MED ORDER — CYANOCOBALAMIN 1000 MCG/ML IJ SOLN
1000.0000 ug | Freq: Once | INTRAMUSCULAR | Status: AC
  Administered 2017-01-19: 1000 ug via SUBCUTANEOUS

## 2017-01-19 NOTE — Progress Notes (Signed)
Patient was seen in office for b12 injection,Patient received injection in left arm.Patient tolerated well

## 2017-02-11 ENCOUNTER — Ambulatory Visit (INDEPENDENT_AMBULATORY_CARE_PROVIDER_SITE_OTHER): Payer: Medicare Other | Admitting: *Deleted

## 2017-02-11 ENCOUNTER — Encounter: Payer: Self-pay | Admitting: *Deleted

## 2017-02-11 ENCOUNTER — Other Ambulatory Visit: Payer: Self-pay

## 2017-02-11 DIAGNOSIS — E538 Deficiency of other specified B group vitamins: Secondary | ICD-10-CM | POA: Diagnosis not present

## 2017-02-11 NOTE — Progress Notes (Signed)
Patient seen in office for Vitamin B 12 injection.   Tolerated IM administration well.  

## 2017-03-03 DIAGNOSIS — H40153 Residual stage of open-angle glaucoma, bilateral: Secondary | ICD-10-CM | POA: Diagnosis not present

## 2017-03-17 ENCOUNTER — Ambulatory Visit (INDEPENDENT_AMBULATORY_CARE_PROVIDER_SITE_OTHER): Payer: Medicare Other | Admitting: Family Medicine

## 2017-03-17 DIAGNOSIS — E538 Deficiency of other specified B group vitamins: Secondary | ICD-10-CM

## 2017-04-19 ENCOUNTER — Other Ambulatory Visit: Payer: Self-pay | Admitting: Family Medicine

## 2017-04-23 ENCOUNTER — Ambulatory Visit (INDEPENDENT_AMBULATORY_CARE_PROVIDER_SITE_OTHER): Payer: Medicare Other

## 2017-04-23 DIAGNOSIS — E538 Deficiency of other specified B group vitamins: Secondary | ICD-10-CM

## 2017-04-23 NOTE — Progress Notes (Signed)
Patient was in office for b12 injection.Patient received injection in her right deltoid. Patient tolerated well

## 2017-05-05 ENCOUNTER — Ambulatory Visit (INDEPENDENT_AMBULATORY_CARE_PROVIDER_SITE_OTHER): Payer: Medicare Other | Admitting: Physician Assistant

## 2017-05-05 ENCOUNTER — Encounter: Payer: Self-pay | Admitting: Physician Assistant

## 2017-05-05 ENCOUNTER — Other Ambulatory Visit: Payer: Self-pay

## 2017-05-05 ENCOUNTER — Ambulatory Visit: Payer: Medicare Other | Admitting: Family Medicine

## 2017-05-05 VITALS — BP 138/68 | HR 80 | Temp 98.8°F | Resp 14 | Wt 176.6 lb

## 2017-05-05 DIAGNOSIS — J988 Other specified respiratory disorders: Secondary | ICD-10-CM | POA: Diagnosis not present

## 2017-05-05 DIAGNOSIS — J029 Acute pharyngitis, unspecified: Secondary | ICD-10-CM

## 2017-05-05 DIAGNOSIS — B9689 Other specified bacterial agents as the cause of diseases classified elsewhere: Secondary | ICD-10-CM

## 2017-05-05 MED ORDER — AZITHROMYCIN 250 MG PO TABS: ORAL_TABLET | ORAL | 0 refills | Status: DC

## 2017-05-05 NOTE — Progress Notes (Signed)
Patient ID: Debra [name] MRN: [id number], DOB: [birth year], 83 y.o. Date of Encounter: 05/05/2017, 9:55 AM    Chief Complaint:  Chief Complaint  Patient presents with  . Cough    symptoms for a week   . Sore Throat  . chest congestion  . Fever     HPI: 83 y.o. year old female is here wit her daughter and they report above symptoms.   They state that she had some low-grade fever at home earlier last week.  Has been having cough and chest congestion productive of thick dark phlegm.  Some sore throat.  Minimal congestion in her head and nose.  No ear ache.  They show me the box of over-the-counter medicine she has been using "[facility]--" cold flu sore throat" medicine.     Home Meds:   Outpatient Medications Prior to Visit  Medication Sig Dispense Refill  . amLODipine (NORVASC) 10 MG tablet TAKE 1 TABLET BY MOUTH ONCE DAIly 90 tablet 3  . atenolol (TENORMIN) 50 MG tablet Take 1 tablet (50 mg total) by mouth daily. 90 tablet 3  . brimonidine (ALPHAGAN) 0.2 % ophthalmic solution Place 1 drop into both eyes 2 (two) times daily.    . cholecalciferol (VITAMIN D) 400 UNITS TABS tablet Take 400 Units by mouth daily. Reported on 06/05/2015    . cloNIDine (CATAPRES) 0.1 MG tablet TAKE 1 TABLET BY MOUTH IN THE AM AND 2 TABS AT BEDTIME 270 tablet 3  . diclofenac sodium (VOLTAREN) 1 % GEL Apply to affected areas three times a day as needed 1 Tube 3  . furosemide (LASIX) 40 MG tablet Take 1 tablet (40 mg total) by mouth daily as needed. 90 tablet 1  . latanoprost (XALATAN) 0.005 % ophthalmic solution Place 1 drop into both eyes at bedtime.     Marland Kitchen lisinopril (PRINIVIL,ZESTRIL) 40 MG tablet Take 1 tablet (40 mg total) by mouth daily. 90 tablet 3  . loperamide (IMODIUM) 2 MG capsule Take 2 capsules (4 mg total) by mouth 2 (two) times daily as needed for diarrhea or loose stools. 30 capsule 1  . methylPREDNISolone (MEDROL DOSEPAK) 4 MG TBPK tablet Take as directed on package 21 tablet 0  .  potassium chloride SA (K-DUR,KLOR-CON) 20 MEQ tablet Take with lasix 30 tablet 6  . pravastatin (PRAVACHOL) 80 MG tablet Take 1 tablet (80 mg total) by mouth daily. 90 tablet 2  . prochlorperazine (COMPAZINE) 10 MG tablet Take 1 tablet (10 mg total) by mouth every 8 (eight) hours as needed for nausea or vomiting. 30 tablet 0  . timolol (TIMOPTIC) 0.5 % ophthalmic solution Place 1 drop into both eyes 2 (two) times daily.     . traMADol (ULTRAM) 50 MG tablet Take 1 tablet (50 mg total) by mouth 2 (two) times daily as needed. 30 tablet 1  . triamcinolone cream (KENALOG) 0.1 % Apply 1 application topically 2 (two) times daily. 30 g 0   Facility-Administered Medications Prior to Visit  Medication Dose Route Frequency Provider Last Rate Last Dose  . cyanocobalamin ((VITAMIN B-12)) injection 1,000 mcg  1,000 mcg Intramuscular Q30 days Alycia Rossetti, MD   1,000 mcg at 04/23/17 1430  . lidocaine (PF) (XYLOCAINE) 1 % injection 0.3 mL  0.3 mL Other Once Magnus Sinning, MD        Allergies:  Allergies  Allergen Reactions  . Codeine Phosphate Nausea And Vomiting    REACTION: unspecified  . Sulfa Antibiotics     unknown  .  Latex Hives, Itching and Rash      Review of Systems: See HPI for pertinent ROS. All other ROS negative.    Physical Exam: Blood pressure 138/68, pulse 80, temperature 98.8 F (37.1 C), temperature source Oral, resp. rate 14, weight 80.1 kg (176 lb 9.6 oz), SpO2 98 %., Body mass index is 31.28 kg/m. General: WNWD WF.  Appears in no acute distress. HEENT: Normocephalic, atraumatic, eyes without discharge, sclera non-icteric, nares are without discharge. Bilateral auditory canals clear, TM's are without perforation, pearly grey and translucent with reflective cone of light bilaterally. Oral cavity moist, posterior pharynx with mild-mod erythema. No exudate, no peritonsillar abscess.  Neck: Supple. No thyromegaly. No lymphadenopathy. Lungs: Clear bilaterally to auscultation  without wheezes, rales, or rhonchi. Breathing is unlabored. Heart: Regular rhythm. No murmurs, rubs, or gallops. Msk:  Strength and tone normal for age. Extremities/Skin: Warm and dry.  Neuro: Alert and oriented X 3. Moves all extremities spontaneously. Gait is normal. CNII-XII grossly in tact. Psych:  Responds to questions appropriately with a normal affect.   Results for orders placed or performed in visit on 05/05/17  STREP GROUP A AG, W/REFLEX TO CULT  Result Value Ref Range   Streptococcus, Group A Screen (Direct) NONE DETECTED      ASSESSMENT AND PLAN:  83 y.o. year old female with  1. Bacterial respiratory infection She is to take antibiotic as directed.  Follow-up if symptoms do not resolve within 1 week after completion of antibiotic.  She can continue using her over-the-counter medication for symptom relief in the interim. - azithromycin (ZITHROMAX) 250 MG tablet; Day 1: Take 2 daily. Days 2 -5: Take 1 daily.  Dispense: 6 tablet; Refill: 0  2. Sore throat - STREP GROUP A AG, W/REFLEX TO CULT   Signed, [address] [place], [place], [facility] 05/05/2017 9:55 AM

## 2017-05-07 LAB — CULTURE, GROUP A STREP
MICRO NUMBER:: 90287514
SOURCE: 0
SPECIMEN QUALITY:: ADEQUATE

## 2017-05-07 LAB — STREP GROUP A AG, W/REFLEX TO CULT: STREPTOCOCCUS, GROUP A SCREEN (DIRECT): NOT DETECTED

## 2017-05-17 ENCOUNTER — Other Ambulatory Visit: Payer: Self-pay | Admitting: Family Medicine

## 2017-05-21 ENCOUNTER — Ambulatory Visit (INDEPENDENT_AMBULATORY_CARE_PROVIDER_SITE_OTHER): Payer: Medicare Other

## 2017-05-21 DIAGNOSIS — E538 Deficiency of other specified B group vitamins: Secondary | ICD-10-CM | POA: Diagnosis not present

## 2017-05-21 MED ORDER — CYANOCOBALAMIN 1000 MCG/ML IJ SOLN
1000.0000 ug | Freq: Once | INTRAMUSCULAR | Status: AC
  Administered 2017-05-21: 1000 ug via INTRAMUSCULAR

## 2017-05-31 ENCOUNTER — Other Ambulatory Visit: Payer: Self-pay | Admitting: Family Medicine

## 2017-06-22 ENCOUNTER — Ambulatory Visit (INDEPENDENT_AMBULATORY_CARE_PROVIDER_SITE_OTHER): Payer: Medicare Other

## 2017-06-22 DIAGNOSIS — E538 Deficiency of other specified B group vitamins: Secondary | ICD-10-CM | POA: Diagnosis not present

## 2017-06-22 NOTE — Progress Notes (Signed)
Patient was in the office for a b12 injection. Patient received the injection in her left deltoid patient tolerated well

## 2017-06-30 DIAGNOSIS — H40153 Residual stage of open-angle glaucoma, bilateral: Secondary | ICD-10-CM | POA: Diagnosis not present

## 2017-07-20 ENCOUNTER — Ambulatory Visit (INDEPENDENT_AMBULATORY_CARE_PROVIDER_SITE_OTHER): Payer: Medicare Other

## 2017-07-20 DIAGNOSIS — E538 Deficiency of other specified B group vitamins: Secondary | ICD-10-CM

## 2017-08-23 ENCOUNTER — Ambulatory Visit (INDEPENDENT_AMBULATORY_CARE_PROVIDER_SITE_OTHER): Payer: Medicare Other | Admitting: Family Medicine

## 2017-08-23 DIAGNOSIS — E538 Deficiency of other specified B group vitamins: Secondary | ICD-10-CM | POA: Diagnosis not present

## 2017-09-20 ENCOUNTER — Ambulatory Visit (INDEPENDENT_AMBULATORY_CARE_PROVIDER_SITE_OTHER): Payer: Medicare Other | Admitting: Family Medicine

## 2017-09-20 DIAGNOSIS — E538 Deficiency of other specified B group vitamins: Secondary | ICD-10-CM

## 2017-10-28 ENCOUNTER — Ambulatory Visit (INDEPENDENT_AMBULATORY_CARE_PROVIDER_SITE_OTHER): Payer: Medicare Other | Admitting: *Deleted

## 2017-10-28 ENCOUNTER — Other Ambulatory Visit: Payer: Self-pay

## 2017-10-28 DIAGNOSIS — E538 Deficiency of other specified B group vitamins: Secondary | ICD-10-CM

## 2017-10-28 NOTE — Progress Notes (Signed)
Patient seen in office for Vitamin B 12 injection.   Tolerated IM administration well in R deltoid.  

## 2017-11-03 DIAGNOSIS — H40153 Residual stage of open-angle glaucoma, bilateral: Secondary | ICD-10-CM | POA: Diagnosis not present

## 2017-11-15 ENCOUNTER — Other Ambulatory Visit: Payer: Self-pay | Admitting: Family Medicine

## 2017-11-26 ENCOUNTER — Ambulatory Visit (INDEPENDENT_AMBULATORY_CARE_PROVIDER_SITE_OTHER): Payer: Medicare Other

## 2017-11-26 DIAGNOSIS — Z23 Encounter for immunization: Secondary | ICD-10-CM

## 2017-11-26 DIAGNOSIS — E538 Deficiency of other specified B group vitamins: Secondary | ICD-10-CM

## 2017-11-26 NOTE — Progress Notes (Signed)
Patient came in today to receive her annual flu shot and her monthly vitamin B12 . Patient received a vitamin B12 in her left deltoid and her fluzone flu vaccine in her right deltoid. Patient tolerated well and VIS given for flu shot.

## 2017-12-27 ENCOUNTER — Ambulatory Visit (INDEPENDENT_AMBULATORY_CARE_PROVIDER_SITE_OTHER): Payer: Medicare Other | Admitting: *Deleted

## 2017-12-27 DIAGNOSIS — E538 Deficiency of other specified B group vitamins: Secondary | ICD-10-CM | POA: Diagnosis not present

## 2018-01-20 ENCOUNTER — Other Ambulatory Visit: Payer: Self-pay | Admitting: Family Medicine

## 2018-02-03 ENCOUNTER — Ambulatory Visit (INDEPENDENT_AMBULATORY_CARE_PROVIDER_SITE_OTHER): Payer: Medicare Other | Admitting: *Deleted

## 2018-02-03 DIAGNOSIS — E538 Deficiency of other specified B group vitamins: Secondary | ICD-10-CM | POA: Diagnosis not present

## 2018-04-20 ENCOUNTER — Ambulatory Visit (INDEPENDENT_AMBULATORY_CARE_PROVIDER_SITE_OTHER): Payer: Medicare Other | Admitting: Family Medicine

## 2018-04-20 DIAGNOSIS — E538 Deficiency of other specified B group vitamins: Secondary | ICD-10-CM | POA: Diagnosis not present

## 2018-04-26 DIAGNOSIS — H40153 Residual stage of open-angle glaucoma, bilateral: Secondary | ICD-10-CM | POA: Diagnosis not present

## 2018-05-06 DIAGNOSIS — M7582 Other shoulder lesions, left shoulder: Secondary | ICD-10-CM | POA: Diagnosis not present

## 2018-05-23 ENCOUNTER — Other Ambulatory Visit: Payer: Self-pay | Admitting: Family Medicine

## 2018-05-30 ENCOUNTER — Other Ambulatory Visit: Payer: Self-pay

## 2018-05-30 ENCOUNTER — Ambulatory Visit (INDEPENDENT_AMBULATORY_CARE_PROVIDER_SITE_OTHER): Payer: Medicare Other

## 2018-05-30 DIAGNOSIS — E538 Deficiency of other specified B group vitamins: Secondary | ICD-10-CM | POA: Diagnosis not present

## 2018-07-12 ENCOUNTER — Ambulatory Visit (INDEPENDENT_AMBULATORY_CARE_PROVIDER_SITE_OTHER): Payer: Medicare Other

## 2018-07-12 ENCOUNTER — Other Ambulatory Visit: Payer: Self-pay

## 2018-07-12 DIAGNOSIS — E538 Deficiency of other specified B group vitamins: Secondary | ICD-10-CM | POA: Diagnosis not present

## 2018-07-12 NOTE — Progress Notes (Signed)
Patient came in to receive her B12 injection. Injection was given in the right deltoid. Patient tolerated well. Advised to return in 3 days for next injection. Patient verbalized understanding.

## 2018-07-18 ENCOUNTER — Other Ambulatory Visit: Payer: Self-pay | Admitting: Family Medicine

## 2018-07-26 DIAGNOSIS — S46012A Strain of muscle(s) and tendon(s) of the rotator cuff of left shoulder, initial encounter: Secondary | ICD-10-CM | POA: Diagnosis not present

## 2018-08-12 ENCOUNTER — Other Ambulatory Visit: Payer: Self-pay

## 2018-08-12 ENCOUNTER — Ambulatory Visit (INDEPENDENT_AMBULATORY_CARE_PROVIDER_SITE_OTHER): Payer: Medicare Other

## 2018-08-12 DIAGNOSIS — E538 Deficiency of other specified B group vitamins: Secondary | ICD-10-CM

## 2018-08-12 NOTE — Progress Notes (Signed)
Patient came in today to receive her monthly B12 injection. B12 was given in her left deltoid. Patient tolerated well

## 2018-08-16 ENCOUNTER — Other Ambulatory Visit: Payer: Self-pay | Admitting: Family Medicine

## 2018-08-29 DIAGNOSIS — H40153 Residual stage of open-angle glaucoma, bilateral: Secondary | ICD-10-CM | POA: Diagnosis not present

## 2018-09-19 ENCOUNTER — Other Ambulatory Visit: Payer: Self-pay

## 2018-09-19 ENCOUNTER — Ambulatory Visit (INDEPENDENT_AMBULATORY_CARE_PROVIDER_SITE_OTHER): Payer: Medicare Other

## 2018-09-19 DIAGNOSIS — E538 Deficiency of other specified B group vitamins: Secondary | ICD-10-CM

## 2018-09-19 NOTE — Progress Notes (Signed)
Pt came in for b12 injection. Administered in R deltoid. Pt tolerated well. 

## 2018-09-30 ENCOUNTER — Other Ambulatory Visit: Payer: Self-pay

## 2018-11-04 ENCOUNTER — Other Ambulatory Visit: Payer: Self-pay

## 2018-11-04 ENCOUNTER — Ambulatory Visit (INDEPENDENT_AMBULATORY_CARE_PROVIDER_SITE_OTHER): Payer: Medicare Other

## 2018-11-04 DIAGNOSIS — E538 Deficiency of other specified B group vitamins: Secondary | ICD-10-CM | POA: Diagnosis not present

## 2018-11-04 NOTE — Progress Notes (Signed)
Patient came in to receive her monthly B12. Patient receive B12 in the right deltoid. Patient tolerated well.

## 2018-11-09 ENCOUNTER — Other Ambulatory Visit: Payer: Self-pay | Admitting: Family Medicine

## 2018-11-14 ENCOUNTER — Other Ambulatory Visit: Payer: Self-pay | Admitting: Family Medicine

## 2018-12-06 ENCOUNTER — Ambulatory Visit (INDEPENDENT_AMBULATORY_CARE_PROVIDER_SITE_OTHER): Payer: Medicare Other

## 2018-12-06 ENCOUNTER — Other Ambulatory Visit: Payer: Self-pay

## 2018-12-06 DIAGNOSIS — E538 Deficiency of other specified B group vitamins: Secondary | ICD-10-CM

## 2018-12-06 DIAGNOSIS — Z23 Encounter for immunization: Secondary | ICD-10-CM

## 2019-01-23 ENCOUNTER — Ambulatory Visit (INDEPENDENT_AMBULATORY_CARE_PROVIDER_SITE_OTHER): Payer: Medicare Other

## 2019-01-23 DIAGNOSIS — E538 Deficiency of other specified B group vitamins: Secondary | ICD-10-CM

## 2019-01-31 DIAGNOSIS — H40153 Residual stage of open-angle glaucoma, bilateral: Secondary | ICD-10-CM | POA: Diagnosis not present

## 2019-02-07 DIAGNOSIS — S46012D Strain of muscle(s) and tendon(s) of the rotator cuff of left shoulder, subsequent encounter: Secondary | ICD-10-CM | POA: Diagnosis not present

## 2019-02-27 ENCOUNTER — Other Ambulatory Visit: Payer: Self-pay

## 2019-02-27 ENCOUNTER — Ambulatory Visit (INDEPENDENT_AMBULATORY_CARE_PROVIDER_SITE_OTHER): Payer: Medicare Other | Admitting: *Deleted

## 2019-02-27 DIAGNOSIS — E538 Deficiency of other specified B group vitamins: Secondary | ICD-10-CM

## 2019-03-06 ENCOUNTER — Other Ambulatory Visit: Payer: Self-pay | Admitting: Family Medicine

## 2019-04-24 ENCOUNTER — Other Ambulatory Visit: Payer: Self-pay | Admitting: Family Medicine

## 2019-05-03 ENCOUNTER — Ambulatory Visit (INDEPENDENT_AMBULATORY_CARE_PROVIDER_SITE_OTHER): Payer: PPO | Admitting: Family Medicine

## 2019-05-03 ENCOUNTER — Other Ambulatory Visit: Payer: Self-pay

## 2019-05-03 ENCOUNTER — Encounter: Payer: Self-pay | Admitting: Family Medicine

## 2019-05-03 VITALS — BP 130/68 | HR 84 | Temp 99.7°F | Resp 16 | Ht 63.0 in | Wt 169.0 lb

## 2019-05-03 DIAGNOSIS — E538 Deficiency of other specified B group vitamins: Secondary | ICD-10-CM

## 2019-05-03 DIAGNOSIS — E559 Vitamin D deficiency, unspecified: Secondary | ICD-10-CM

## 2019-05-03 DIAGNOSIS — Z0000 Encounter for general adult medical examination without abnormal findings: Secondary | ICD-10-CM | POA: Diagnosis not present

## 2019-05-03 DIAGNOSIS — H906 Mixed conductive and sensorineural hearing loss, bilateral: Secondary | ICD-10-CM

## 2019-05-03 DIAGNOSIS — I10 Essential (primary) hypertension: Secondary | ICD-10-CM | POA: Diagnosis not present

## 2019-05-03 DIAGNOSIS — H9190 Unspecified hearing loss, unspecified ear: Secondary | ICD-10-CM | POA: Insufficient documentation

## 2019-05-03 DIAGNOSIS — E7800 Pure hypercholesterolemia, unspecified: Secondary | ICD-10-CM | POA: Diagnosis not present

## 2019-05-03 MED ORDER — TETANUS-DIPHTHERIA TOXOIDS TD 2-2 LF/0.5ML IM SUSP: 0.5000 mL | Freq: Once | INTRAMUSCULAR | 0 refills | Status: AC

## 2019-05-03 MED ORDER — SHINGRIX 50 MCG/0.5ML IM SUSR: 0.5000 mL | Freq: Once | INTRAMUSCULAR | 1 refills | Status: AC

## 2019-05-03 NOTE — Patient Instructions (Signed)
F/U 6 months  We will call with lab results B12 shot given

## 2019-05-03 NOTE — Progress Notes (Signed)
Subjective:   Patient presents for Medicare Annual/Subsequent preventive examination.   Patient here to follow-up medications and for annual wellness visit her last visit with me was actually in October 2018 and she had been caring for her husband.  She did see my PA for an acute visit in early 06-21-17 she was however coming in to get B12 shots done.   She has been following with Dr. Alfonso Ramus orthopedics secondary to rotator cuff strain of her left shoulder.  He has been getting steroid injections every 3 to 4 months.  She does not want any surgical intervention.  Has been following with ophthalmology secondary to glaucoma she is still on drops.  Hypertension she is taking lisinopril 40 mg once a day as well is clonidine 0.1 mg in the morning and 0.2 mg at bedtime, amlodipine 10 mg daily, atenolol 50 mg daily  Hyperlipidemia- on pravastaatin  80mg    He has history of meningioma no symptoms, no longer following with neurosurgery   She has history of leukocytopenia she was followed by hematology in the past but has not been seen in the past few years due for CBC   Peripheral edema she takes Lasix daily she has not had any problems with fluid retention  He has lost 7 pounds since her last visit in 21-Jun-2017.  Her husband passed away during this time and she was very stressed.  She states her appetite is good now Review Past Medical/Family/Social: Per EMR   Risk Factors  Current exercise habits: Tries to stay active with house activities Dietary issues discussed: Discussed  Cardiac risk factors: Marland Kitchen  Hypertension hyperlipidemia  Depression Screen  (Note: if answer to either of the following is "Yes", a more complete depression screening is indicated)  Over the past two weeks, have you felt down, depressed or hopeless? No Over the past two weeks, have you felt little interest or pleasure in doing things? No Have you lost interest or pleasure in daily life? No Do you often feel hopeless? No Do you  cry easily over simple problems? No   Activities of Daily Living  In your present state of health, do you have any difficulty performing the following activities?:  Driving? No  Managing money? No  Feeding yourself? No  Getting from bed to chair? No  Climbing a flight of stairs? yes  Preparing food and eating?: No  Bathing or showering? No  Getting dressed: No  Getting to the toilet? No  Using the toilet:No  Moving around from place to place: No  In the past year have you fallen or had a near fall?:No  Are you sexually active? No  Do you have more than one partner? No   Hearing Difficulties: yes, wears hearing aids but legally deaf Do you often ask people to speak up or repeat themselves? yes Do you experience ringing or noises in your ears? No Do you have difficulty understanding soft or whispered voices?yes  Do you feel that you have a problem with memory? No Do you often misplace items? No  Do you feel safe at home? Yes  Cognitive Testing  Alert? Yes Normal Appearance?Yes  Oriented to person? Yes Place? Yes  Time? Yes  Recall of three objects? Yes  Can perform simple calculations? Yes  Displays appropriate judgment?Yes  Can read the correct time from a watch face?Yes   List the Names of Other Physician/Practitioners you currently use:  Dr. Alfonso Ramus orthopedics Dr. Gloriann Loan ophthalmology   Screening Tests / Date Colonoscopy  Over age             Zostavax  Due  Mammogram - over age  Influenza Vaccine UTD Tetanus/tdap Due  Pneumonia- allergic but had Pneumonvia vaccine 23 given  COVID-19 vaccine series done   ROS:  GEN- denies fatigue, fever, weight loss,weakness, recent illness HEENT- denies eye drainage, change in vision, nasal discharge, CVS- denies chest pain, palpitations RESP- denies SOB, cough, wheeze ABD- denies N/V, change in stools, abd pain GU- denies dysuria, hematuria, dribbling, incontinence MSK- + joint pain, muscle aches, injury Neuro- denies  headache, dizziness, syncope, seizure activity  PHYSICAL:Vitals reviewed  GEN- NAD, alert and oriented x3 HEENT- PERRL, EOMI, non injected sclera, pink conjunctiva, MMM, oropharynx clear Neck- Supple, no thryomegaly, no bruit CVS- RRR, no murmur RESP-CTAB ABD-NABS,soft, NT,ND EXT- No edema Pulses- Radial, DP- 2+   Assessment:    Annual wellness medicare exam   Plan:    During the course of the visit the patient was educated and counseled about appropriate screening and preventive services including:   Hypertension well controlled no change in medication.  Fasting labs to be obtained today.  Hyperlipidemia check lipid panel continue statin drug  Hearing impaired she has bilateral hearing aids.  Rotator cuff syndrome osteoarthritis she is followed by orthopedics has tramadol on hand for severe pain otherwise takes Tylenol.  Peripheral edema maintained with Lasix  Immunizations we will send shingles and tetanus vaccine to the pharmacy  Glaucoma followed by ophthalmology on  drops  B12 deficiency continue with injections given today we will check her B12 level   Daughter has power of attorney/ advanced directives      Diet review for nutrition referral? Yes ____ Not Indicated __x__  Patient Instructions (the written plan) was given to the patient.  Medicare Attestation  I have personally reviewed:  The patient's medical and social history  Their use of alcohol, tobacco or illicit drugs  Their current medications and supplements  The patient's functional ability including ADLs,fall risks, home safety risks, cognitive, and hearing and visual impairment  Diet and physical activities  Evidence for depression or mood disorders  The patient's weight, height, BMI, and visual acuity have been recorded in the chart. I have made referrals, counseling, and provided education to the patient based on review of the above and I have provided the patient with a written personalized  care plan for preventive services.

## 2019-05-04 ENCOUNTER — Other Ambulatory Visit: Payer: Self-pay | Admitting: *Deleted

## 2019-05-04 DIAGNOSIS — E538 Deficiency of other specified B group vitamins: Secondary | ICD-10-CM

## 2019-05-04 LAB — COMPREHENSIVE METABOLIC PANEL
AG Ratio: 1.8 (calc) (ref 1.0–2.5)
ALT: 11 U/L (ref 6–29)
AST: 15 U/L (ref 10–35)
Albumin: 4.4 g/dL (ref 3.6–5.1)
Alkaline phosphatase (APISO): 67 U/L (ref 37–153)
BUN/Creatinine Ratio: 19 (calc) (ref 6–22)
BUN: 18 mg/dL (ref 7–25)
CO2: 27 mmol/L (ref 20–32)
Calcium: 9.5 mg/dL (ref 8.6–10.4)
Chloride: 106 mmol/L (ref 98–110)
Creat: 0.95 mg/dL — ABNORMAL HIGH (ref 0.60–0.88)
Globulin: 2.4 g/dL (calc) (ref 1.9–3.7)
Glucose, Bld: 98 mg/dL (ref 65–99)
Potassium: 4 mmol/L (ref 3.5–5.3)
Sodium: 142 mmol/L (ref 135–146)
Total Bilirubin: 0.6 mg/dL (ref 0.2–1.2)
Total Protein: 6.8 g/dL (ref 6.1–8.1)

## 2019-05-04 LAB — TSH: TSH: 3.84 mIU/L (ref 0.40–4.50)

## 2019-05-04 LAB — CBC WITH DIFFERENTIAL/PLATELET
Absolute Monocytes: 209 cells/uL (ref 200–950)
Basophils Absolute: 40 cells/uL (ref 0–200)
Basophils Relative: 1.1 %
Eosinophils Absolute: 108 cells/uL (ref 15–500)
Eosinophils Relative: 3 %
HCT: 41.3 % (ref 35.0–45.0)
Hemoglobin: 14.2 g/dL (ref 11.7–15.5)
Lymphs Abs: 904 cells/uL (ref 850–3900)
MCH: 30.5 pg (ref 27.0–33.0)
MCHC: 34.4 g/dL (ref 32.0–36.0)
MCV: 88.8 fL (ref 80.0–100.0)
MPV: 9.2 fL (ref 7.5–12.5)
Monocytes Relative: 5.8 %
Neutro Abs: 2340 cells/uL (ref 1500–7800)
Neutrophils Relative %: 65 %
Platelets: 186 10*3/uL (ref 140–400)
RBC: 4.65 10*6/uL (ref 3.80–5.10)
RDW: 13.2 % (ref 11.0–15.0)
Total Lymphocyte: 25.1 %
WBC: 3.6 10*3/uL — ABNORMAL LOW (ref 3.8–10.8)

## 2019-05-04 LAB — VITAMIN D 25 HYDROXY (VIT D DEFICIENCY, FRACTURES): Vit D, 25-Hydroxy: 13 ng/mL — ABNORMAL LOW (ref 30–100)

## 2019-05-04 LAB — LIPID PANEL
Cholesterol: 250 mg/dL — ABNORMAL HIGH (ref <200)
HDL: 45 mg/dL — ABNORMAL LOW (ref >=50)
LDL Cholesterol (Calc): 166 mg/dL (calc) — ABNORMAL HIGH
Non-HDL Cholesterol (Calc): 205 mg/dL (calc) — ABNORMAL HIGH (ref <130)
Total CHOL/HDL Ratio: 5.6 (calc) — ABNORMAL HIGH (ref <5.0)
Triglycerides: 219 mg/dL — ABNORMAL HIGH (ref <150)

## 2019-05-04 LAB — VITAMIN B12: Vitamin B-12: >2000 pg/mL — ABNORMAL HIGH (ref 200–1100)

## 2019-05-04 MED ORDER — CYANOCOBALAMIN 1000 MCG/ML IJ SOLN
1000.0000 ug | INTRAMUSCULAR | Status: DC
  Administered 2019-08-10 – 2019-11-03 (×3): 1000 ug via INTRAMUSCULAR

## 2019-05-04 MED ORDER — VITAMIN D (ERGOCALCIFEROL) 1.25 MG (50000 UNIT) PO CAPS: 50000.0000 [IU] | ORAL_CAPSULE | ORAL | 0 refills | Status: DC

## 2019-05-04 MED ORDER — ATORVASTATIN CALCIUM 40 MG PO TABS: 40.0000 mg | ORAL_TABLET | Freq: Every day | ORAL | 3 refills | Status: DC

## 2019-05-04 MED ORDER — TETANUS-DIPHTH-ACELL PERTUSSIS 5-2.5-18.5 LF-MCG/0.5 IM SUSP: 0.5000 mL | Freq: Once | INTRAMUSCULAR | 0 refills | Status: DC

## 2019-05-04 MED ORDER — TETANUS-DIPHTH-ACELL PERTUSSIS 5-2-15.5 LF-MCG/0.5 IM SUSP: 0.5000 mL | Freq: Once | INTRAMUSCULAR | 0 refills | Status: AC

## 2019-05-04 NOTE — Addendum Note (Signed)
Addended by: Sheral Flow on: 05/04/2019 11:34 AM   Modules accepted: Orders

## 2019-06-03 ENCOUNTER — Other Ambulatory Visit: Payer: Self-pay | Admitting: Family Medicine

## 2019-06-06 ENCOUNTER — Ambulatory Visit (INDEPENDENT_AMBULATORY_CARE_PROVIDER_SITE_OTHER): Payer: PPO

## 2019-06-06 ENCOUNTER — Ambulatory Visit (HOSPITAL_COMMUNITY)
Admission: EM | Admit: 2019-06-06 | Discharge: 2019-06-06 | Disposition: A | Discharge status: Home / Self Care | Payer: PPO | Attending: Urgent Care | Admitting: Urgent Care

## 2019-06-06 ENCOUNTER — Other Ambulatory Visit: Payer: Self-pay

## 2019-06-06 ENCOUNTER — Encounter (HOSPITAL_COMMUNITY): Payer: Self-pay

## 2019-06-06 DIAGNOSIS — S52502A Unspecified fracture of the lower end of left radius, initial encounter for closed fracture: Secondary | ICD-10-CM | POA: Diagnosis not present

## 2019-06-06 DIAGNOSIS — R58 Hemorrhage, not elsewhere classified: Secondary | ICD-10-CM

## 2019-06-06 DIAGNOSIS — S6992XA Unspecified injury of left wrist, hand and finger(s), initial encounter: Secondary | ICD-10-CM

## 2019-06-06 DIAGNOSIS — S52592A Other fractures of lower end of left radius, initial encounter for closed fracture: Secondary | ICD-10-CM | POA: Diagnosis not present

## 2019-06-06 DIAGNOSIS — W19XXXA Unspecified fall, initial encounter: Secondary | ICD-10-CM

## 2019-06-06 DIAGNOSIS — M25532 Pain in left wrist: Secondary | ICD-10-CM

## 2019-06-06 DIAGNOSIS — M25432 Effusion, left wrist: Secondary | ICD-10-CM

## 2019-06-06 MED ORDER — TRAMADOL HCL 50 MG PO TABS: 50.0000 mg | ORAL_TABLET | Freq: Four times a day (QID) | ORAL | 0 refills | Status: DC | PRN

## 2019-06-06 MED ORDER — ACETAMINOPHEN 325 MG PO TABS
650.0000 mg | ORAL_TABLET | Freq: Once | ORAL | Status: AC
  Administered 2019-06-06: 650 mg via ORAL

## 2019-06-06 MED ORDER — ACETAMINOPHEN 325 MG PO TABS
ORAL_TABLET | ORAL | Status: AC
  Filled 2019-06-06: qty 2

## 2019-06-06 NOTE — ED Triage Notes (Signed)
Pt presents to UC with pain and swelling in left wrist after she fell over her left wrist yesterday when she  walking downhill and fell.

## 2019-06-06 NOTE — Progress Notes (Signed)
Orthopedic Tech Progress Note Patient Details:  Debra [name] Aug 25, 1934 [id number]  Ortho Devices Type of Ortho Device: Sling immobilizer, Sugartong splint Ortho Device/Splint Location: LUE Ortho Device/Splint Interventions: Ordered, Application   Post Interventions Patient Tolerated: Well Instructions Provided: [facility] 06/06/2019, 12:38 PM

## 2019-06-06 NOTE — ED Provider Notes (Signed)
[place]   MRN: [id number] DOB: 08-09-1934  Subjective:   Debra [name] is a 85 y.o. female presenting for 1 day hx of suffering a left wrist injury from an accidental fall.  Patient states that she was outdoors and trying to pick up some potato skins, slipped on some pine needle and ended up landing on her left wrist.  Has noted some bruising and swelling, pain is fairly constant and worse with palpation or using her wrist, severity is moderate.  Patient states that she has used Tylenol, uses this regularly anyway for her left shoulder pain that is chronic.  She generally takes tramadol for this as well, is out of this medication.   Current Facility-Administered Medications:  .  cyanocobalamin ((VITAMIN B-12)) injection 1,000 mcg, 1,000 mcg, Intramuscular, Q30 days, Deal, Kawanta F, MD .  lidocaine (PF) (XYLOCAINE) 1 % injection 0.3 mL, 0.3 mL, Other, Once, Magnus Sinning, MD  Current Outpatient Medications:  .  amLODipine (NORVASC) 10 MG tablet, TAKE 1 TABLET BY MOUTH ONCE DAILY, Disp: 90 tablet, Rfl: 3 .  atenolol (TENORMIN) 50 MG tablet, TAKE 1 TABLET BY MOUTH ONCE DAILY, Disp: 90 tablet, Rfl: 3 .  atorvastatin (LIPITOR) 40 MG tablet, Take 1 tablet (40 mg total) by mouth daily., Disp: 90 tablet, Rfl: 3 .  brimonidine (ALPHAGAN) 0.2 % ophthalmic solution, Place 1 drop into both eyes 2 (two) times daily., Disp: , Rfl:  .  cloNIDine (CATAPRES) 0.1 MG tablet, TAKE 1 TABLET BY MOUTH IN THE MORNING AND 2 TABLETS AT BEDTIME, Disp: 270 tablet, Rfl: 3 .  furosemide (LASIX) 40 MG tablet, TAKE 1 TABLET BY MOUTH ONCE DAILY AS NEEDED, Disp: 90 tablet, Rfl: 1 .  latanoprost (XALATAN) 0.005 % ophthalmic solution, Place 1 drop into both eyes at bedtime. , Disp: , Rfl:  .  lisinopril (ZESTRIL) 40 MG tablet, TAKE 1 TABLET BY MOUTH ONCE A DAY, Disp: 90 tablet, Rfl: 3 .  timolol (TIMOPTIC) 0.5 % ophthalmic solution, Place 1 drop into both eyes 2 (two) times daily. , Disp: , Rfl:  .   traMADol (ULTRAM) 50 MG tablet, Take 1 tablet (50 mg total) by mouth 2 (two) times daily as needed., Disp: 30 tablet, Rfl: 1 .  Vitamin D, Ergocalciferol, (DRISDOL) 1.25 MG (50000 UNIT) CAPS capsule, Take 1 capsule (50,000 Units total) by mouth every 7 (seven) days. x12 weeks., Disp: 12 capsule, Rfl: 0   Allergies  Allergen Reactions  . Codeine Phosphate Nausea And Vomiting    REACTION: unspecified  . Pneumococcal Vaccines Hives  . Sulfa Antibiotics     unknown  . Latex Hives, Itching and Rash    Past Medical History:  Diagnosis Date  . Brain mass    monitor by Dr Trenton Gammon - right posterior fossa meningioma - thinks it's benign per patient  . Cancer (HCC)    hand, face - spots removed  . Constipation   . Degeneration of lumbar or lumbosacral intervertebral disc    arthritis in back, hips and hands - otc med prn  . Diarrhea   . Diverticulosis of colon with hemorrhage   . Dysrhythmia    Hx irregular heart beat - 15 yrs ago  . Female stress incontinence   . Headache    otc med prn  . Hypertension   . Lumbago   . Missed abortion    x 2 - no surgery required  . Paroxysmal supraventricular tachycardia (HCC)    Hx  . PONV (postoperative nausea and  vomiting)   . Pure hypercholesterolemia   . SVD (spontaneous vaginal delivery)    x 4  . Unspecified glaucoma(365.9)    bilateral     Past Surgical History:  Procedure Laterality Date  . ABDOMINAL HYSTERECTOMY    . APPENDECTOMY    . COLONOSCOPY    . EYE SURGERY     bilateral cataract eye surgery  . EYE SURGERY     laser to relieve eye pressure - bilateral  . LAPAROTOMY N/A 02/06/2014   Procedure: EXPLORATORY LAPAROTOMY;  Surgeon: Lavonia Drafts, MD;  Location: [facility];  Service: Gynecology;  Laterality: N/A;  . MULTIPLE TOOTH EXTRACTIONS     upper  . right hand surgery     ganglion cyst removed  . TUBAL LIGATION      Family History  Problem Relation Age of Onset  . Aneurysm Mother   . Alzheimer's disease Mother     . Cancer Father        prostate  . Cancer Sister   . Arthritis Sister   . Heart disease Brother   . Hypertension Brother   . Glaucoma Brother     Social History   Tobacco Use  . Smoking status: Never Smoker  . Smokeless tobacco: Never Used  Substance Use Topics  . Alcohol use: No  . Drug use: No    ROS Denies chest pain, confusion, dizziness just prior to her fall, insists that it was purely accidental.  Objective:   Vitals: BP 136/65 (BP Location: Right Arm)   Pulse 62   Temp 98.4 F (36.9 C) (Oral)   Resp 17   SpO2 98%   Physical Exam Constitutional:      General: She is not in acute distress.    Appearance: Normal appearance. She is well-developed. She is not ill-appearing, toxic-appearing or diaphoretic.  HENT:     Head: Normocephalic and atraumatic.     Nose: Nose normal.     Mouth/Throat:     Mouth: Mucous membranes are moist.     Pharynx: Oropharynx is clear.  Eyes:     General: No scleral icterus.    Extraocular Movements: Extraocular movements intact.     Pupils: Pupils are equal, round, and reactive to light.  Cardiovascular:     Rate and Rhythm: Normal rate and regular rhythm.     Pulses: Normal pulses.     Heart sounds: Normal heart sounds. No murmur. No friction rub. No gallop.   Pulmonary:     Effort: Pulmonary effort is normal. No respiratory distress.     Breath sounds: Normal breath sounds. No stridor. No wheezing, rhonchi or rales.  Musculoskeletal:     Left wrist: Swelling, deformity, tenderness (with associated ecchymosis), bony tenderness and snuff box tenderness present. Decreased range of motion.  Skin:    General: Skin is warm and dry.     Findings: No rash.  Neurological:     General: No focal deficit present.     Mental Status: She is alert and oriented to person, place, and time.  Psychiatric:        Mood and Affect: Mood normal.        Behavior: Behavior normal.        Thought Content: Thought content normal.         Judgment: Judgment normal.    DG Wrist Complete Left  Result Date: 06/06/2019 CLINICAL DATA:  Fall yesterday with wrist pain, initial encounter EXAM: LEFT WRIST - COMPLETE 3+ VIEW COMPARISON:  None.  FINDINGS: Considerable soft tissue swelling is noted about the wrist. There is an undisplaced distal radial fracture identified involving the radial styloid. No other fractures are seen. Degenerative changes of the first [facility] joint are noted. IMPRESSION: Undisplaced distal radial fracture. Electronically Signed   By: Inez Catalina M.D.   On: 06/06/2019 11:58    Assessment and Plan :   1. Closed fracture of distal end of left radius, unspecified fracture morphology, initial encounter   2. Accidental fall, initial encounter   3. Left wrist injury, initial encounter   4. Ecchymosis   5. Pain and swelling of left wrist     Ortho tech will place patient in sugar tong splint. Use shoulder sling. Tramadol refilled. Scheduled APAP and use Tramadol for breakthrough pain. Follow up with her orthopedist, Raliegh Ip.  Also provided her with information to [facility] as they are on-call through [place]. Counseled patient on potential for adverse effects with medications prescribed/recommended today, ER and return-to-clinic precautions discussed, patient verbalized understanding.    Jaynee Eagles, PA-C 06/06/19 1233

## 2019-06-09 DIAGNOSIS — S52502A Unspecified fracture of the lower end of left radius, initial encounter for closed fracture: Secondary | ICD-10-CM | POA: Diagnosis not present

## 2019-07-06 DIAGNOSIS — M7582 Other shoulder lesions, left shoulder: Secondary | ICD-10-CM | POA: Diagnosis not present

## 2019-07-06 DIAGNOSIS — S52502D Unspecified fracture of the lower end of left radius, subsequent encounter for closed fracture with routine healing: Secondary | ICD-10-CM | POA: Diagnosis not present

## 2019-07-18 DIAGNOSIS — H40153 Residual stage of open-angle glaucoma, bilateral: Secondary | ICD-10-CM | POA: Diagnosis not present

## 2019-08-10 ENCOUNTER — Other Ambulatory Visit: Payer: Self-pay

## 2019-08-10 ENCOUNTER — Ambulatory Visit (INDEPENDENT_AMBULATORY_CARE_PROVIDER_SITE_OTHER): Payer: PPO

## 2019-08-10 DIAGNOSIS — E538 Deficiency of other specified B group vitamins: Secondary | ICD-10-CM

## 2019-08-14 ENCOUNTER — Other Ambulatory Visit: Payer: Self-pay | Admitting: Family Medicine

## 2019-09-13 ENCOUNTER — Other Ambulatory Visit: Payer: Self-pay

## 2019-09-13 ENCOUNTER — Ambulatory Visit (INDEPENDENT_AMBULATORY_CARE_PROVIDER_SITE_OTHER): Payer: PPO

## 2019-09-13 DIAGNOSIS — E538 Deficiency of other specified B group vitamins: Secondary | ICD-10-CM

## 2019-09-13 MED ORDER — CYANOCOBALAMIN 1000 MCG/ML IJ SOLN: 1000.0000 ug | Freq: Once | INTRAMUSCULAR | Status: DC

## 2019-09-18 ENCOUNTER — Other Ambulatory Visit: Payer: Self-pay | Admitting: Family Medicine

## 2019-11-03 ENCOUNTER — Other Ambulatory Visit: Payer: Self-pay

## 2019-11-03 ENCOUNTER — Encounter: Payer: Self-pay | Admitting: Family Medicine

## 2019-11-03 ENCOUNTER — Ambulatory Visit (INDEPENDENT_AMBULATORY_CARE_PROVIDER_SITE_OTHER): Payer: PPO | Admitting: Family Medicine

## 2019-11-03 VITALS — BP 132/78 | HR 64 | Temp 98.3°F | Resp 14 | Ht 63.0 in | Wt 170.0 lb

## 2019-11-03 DIAGNOSIS — E7800 Pure hypercholesterolemia, unspecified: Secondary | ICD-10-CM

## 2019-11-03 DIAGNOSIS — I10 Essential (primary) hypertension: Secondary | ICD-10-CM

## 2019-11-03 DIAGNOSIS — E538 Deficiency of other specified B group vitamins: Secondary | ICD-10-CM | POA: Diagnosis not present

## 2019-11-03 DIAGNOSIS — H906 Mixed conductive and sensorineural hearing loss, bilateral: Secondary | ICD-10-CM | POA: Diagnosis not present

## 2019-11-03 DIAGNOSIS — E559 Vitamin D deficiency, unspecified: Secondary | ICD-10-CM

## 2019-11-03 DIAGNOSIS — Z23 Encounter for immunization: Secondary | ICD-10-CM

## 2019-11-03 NOTE — Progress Notes (Signed)
   Subjective:    Patient ID: Debra [name], female    DOB: September 13, 1934, 85 y.o.   MRN: [id number]  Patient presents for Follow-up (is not fasting) and Injections (Vit B)   Pt here to f/u chronic medical    medications.  Fasting labs to be obtained today.  Hyperlipidemia check lipid panel continue statin drug  Hearing impaired she has bilateral hearing aids.  Peripheral edema maintained with Lasix   Glaucoma followed by ophthalmology on  drops  B12 deficiency continue with injections given today we will check her B12 level     She was seen by Dr. Alfonso Ramus- Doree Fudge she had fracture of her left arm / she had steroid shot back in May, told she can get shot every 3 months   Review Of Systems:  GEN- denies fatigue, fever, weight loss,weakness, recent illness HEENT- denies eye drainage, change in vision, nasal discharge, CVS- denies chest pain, palpitations RESP- denies SOB, cough, wheeze ABD- denies N/V, change in stools, abd pain GU- denies dysuria, hematuria, dribbling, incontinence MSK- + joint pain, muscle aches, injury Neuro- denies headache, dizziness, syncope, seizure activity       Objective:    BP 132/78   Pulse 64   Temp 98.3 F (36.8 C) (Temporal)   Resp 14   Ht 5\' 3"  (1.6 m)   Wt 170 lb (77.1 kg)   SpO2 96%   BMI 30.11 kg/m  GEN- NAD, alert and oriented x3 HEENT- PERRL, EOMI, non injected sclera, pink conjunctiva, MMM, oropharynx clear Neck- Supple, no thyromegaly CVS- RRR, no murmur RESP-CTAB ABD-NABS,soft,NT,ND EXT- No edema Pulses- Radial, DP- 2+        Assessment & Plan:      Problem List Items Addressed This Visit      Unprioritized   Essential hypertension, benign - Primary    Controlled no changes       Relevant Medications   pravastatin (PRAVACHOL) 80 MG tablet   Other Relevant Orders   CBC with Differential/Platelet (Completed)   Comprehensive metabolic panel (Completed)   Hearing impaired    Continue hearing  aides      HYPERCHOLESTEROLEMIA, PURE   Relevant Medications   pravastatin (PRAVACHOL) 80 MG tablet   Other Relevant Orders   Lipid panel (Completed)   Vitamin B12 deficiency    Continue b12 shots       Relevant Orders   Vitamin B12 (Completed)   Vitamin D deficiency   Relevant Orders   Vitamin D, 25-hydroxy (Completed)    Other Visit Diagnoses    Need for immunization against influenza       Relevant Orders   Flu Vaccine QUAD High Dose(Fluad) (Completed)      Note: This dictation was prepared with Dragon dictation along with smaller phrase technology. Any transcriptional errors that result from this process are unintentional.

## 2019-11-03 NOTE — Patient Instructions (Signed)
F/U 6 months for Wellness visit B12 shot given FLu shot

## 2019-11-04 LAB — COMPREHENSIVE METABOLIC PANEL
AG Ratio: 1.9 (calc) (ref 1.0–2.5)
ALT: 10 U/L (ref 6–29)
AST: 15 U/L (ref 10–35)
Albumin: 4.2 g/dL (ref 3.6–5.1)
Alkaline phosphatase (APISO): 59 U/L (ref 37–153)
BUN/Creatinine Ratio: 20 (calc) (ref 6–22)
BUN: 19 mg/dL (ref 7–25)
CO2: 25 mmol/L (ref 20–32)
Calcium: 9.5 mg/dL (ref 8.6–10.4)
Chloride: 106 mmol/L (ref 98–110)
Creat: 0.97 mg/dL — ABNORMAL HIGH (ref 0.60–0.88)
Globulin: 2.2 g/dL (calc) (ref 1.9–3.7)
Glucose, Bld: 92 mg/dL (ref 65–99)
Potassium: 4.2 mmol/L (ref 3.5–5.3)
Sodium: 140 mmol/L (ref 135–146)
Total Bilirubin: 0.7 mg/dL (ref 0.2–1.2)
Total Protein: 6.4 g/dL (ref 6.1–8.1)

## 2019-11-04 LAB — LIPID PANEL
Cholesterol: 186 mg/dL (ref <200)
HDL: 47 mg/dL — ABNORMAL LOW (ref >=50)
LDL Cholesterol (Calc): 111 mg/dL (calc) — ABNORMAL HIGH
Non-HDL Cholesterol (Calc): 139 mg/dL (calc) — ABNORMAL HIGH (ref <130)
Total CHOL/HDL Ratio: 4 (calc) (ref <5.0)
Triglycerides: 162 mg/dL — ABNORMAL HIGH (ref <150)

## 2019-11-04 LAB — CBC WITH DIFFERENTIAL/PLATELET
Absolute Monocytes: 287 cells/uL (ref 200–950)
Basophils Absolute: 32 cells/uL (ref 0–200)
Basophils Relative: 0.9 %
Eosinophils Absolute: 140 cells/uL (ref 15–500)
Eosinophils Relative: 4 %
HCT: 39.9 % (ref 35.0–45.0)
Hemoglobin: 13.7 g/dL (ref 11.7–15.5)
Lymphs Abs: 1085 cells/uL (ref 850–3900)
MCH: 30.4 pg (ref 27.0–33.0)
MCHC: 34.3 g/dL (ref 32.0–36.0)
MCV: 88.7 fL (ref 80.0–100.0)
MPV: 9.7 fL (ref 7.5–12.5)
Monocytes Relative: 8.2 %
Neutro Abs: 1957 cells/uL (ref 1500–7800)
Neutrophils Relative %: 55.9 %
Platelets: 173 10*3/uL (ref 140–400)
RBC: 4.5 10*6/uL (ref 3.80–5.10)
RDW: 12.4 % (ref 11.0–15.0)
Total Lymphocyte: 31 %
WBC: 3.5 10*3/uL — ABNORMAL LOW (ref 3.8–10.8)

## 2019-11-04 LAB — VITAMIN B12: Vitamin B-12: >2000 pg/mL — ABNORMAL HIGH (ref 200–1100)

## 2019-11-04 LAB — VITAMIN D 25 HYDROXY (VIT D DEFICIENCY, FRACTURES): Vit D, 25-Hydroxy: 28 ng/mL — ABNORMAL LOW (ref 30–100)

## 2019-11-05 ENCOUNTER — Encounter: Payer: Self-pay | Admitting: Family Medicine

## 2019-11-05 NOTE — Assessment & Plan Note (Signed)
Controlled no changes 

## 2019-11-05 NOTE — Assessment & Plan Note (Signed)
Continue b12 shots.  

## 2019-11-05 NOTE — Assessment & Plan Note (Signed)
Continue hearing aides

## 2019-11-16 ENCOUNTER — Other Ambulatory Visit: Payer: Self-pay | Admitting: *Deleted

## 2019-11-16 MED ORDER — PRAVASTATIN SODIUM 80 MG PO TABS: 80.0000 mg | ORAL_TABLET | Freq: Every day | ORAL | 3 refills | Status: DC

## 2019-12-13 ENCOUNTER — Other Ambulatory Visit: Payer: Self-pay | Admitting: Nurse Practitioner

## 2020-01-03 ENCOUNTER — Other Ambulatory Visit: Payer: Self-pay

## 2020-01-03 ENCOUNTER — Telehealth: Payer: Self-pay | Admitting: *Deleted

## 2020-01-03 DIAGNOSIS — K5731 Diverticulosis of large intestine without perforation or abscess with bleeding: Secondary | ICD-10-CM

## 2020-01-03 MED ORDER — CIPROFLOXACIN HCL 250 MG PO TABS: 250.0000 mg | ORAL_TABLET | Freq: Two times a day (BID) | ORAL | 0 refills | Status: DC

## 2020-01-03 MED ORDER — METRONIDAZOLE 500 MG PO TABS: 500.0000 mg | ORAL_TABLET | Freq: Two times a day (BID) | ORAL | 0 refills | Status: DC

## 2020-01-03 NOTE — Telephone Encounter (Signed)
Received call from patient daughter, Juliann Pulse [phone] telephone.   Reports that patient is having flare of diverticulitis x1 day. States that she does not want to come in office for eval. Reports that PCP did state ABTx could be sent in if Sx occurred at last OV.   MD please advise.

## 2020-01-03 NOTE — Telephone Encounter (Signed)
I recommend starting her on Cipro  250mg  BID x 7 days and Flagyl 500mg  BID x 7 days Come into office for recheck if not improving  Keep hydrated

## 2020-01-03 NOTE — Telephone Encounter (Signed)
Message sent thru MyChart and ABX sent also.

## 2020-01-29 ENCOUNTER — Other Ambulatory Visit: Payer: Self-pay | Admitting: Family Medicine

## 2020-03-05 ENCOUNTER — Other Ambulatory Visit: Payer: Self-pay | Admitting: Family Medicine

## 2020-05-03 ENCOUNTER — Ambulatory Visit (INDEPENDENT_AMBULATORY_CARE_PROVIDER_SITE_OTHER): Payer: PPO | Admitting: Family Medicine

## 2020-05-03 ENCOUNTER — Other Ambulatory Visit: Payer: Self-pay

## 2020-05-03 ENCOUNTER — Encounter: Payer: Self-pay | Admitting: Family Medicine

## 2020-05-03 VITALS — BP 124/62 | HR 62 | Temp 98.1°F | Resp 16 | Ht 63.0 in | Wt 166.0 lb

## 2020-05-03 DIAGNOSIS — E559 Vitamin D deficiency, unspecified: Secondary | ICD-10-CM

## 2020-05-03 DIAGNOSIS — E538 Deficiency of other specified B group vitamins: Secondary | ICD-10-CM

## 2020-05-03 DIAGNOSIS — R609 Edema, unspecified: Secondary | ICD-10-CM

## 2020-05-03 DIAGNOSIS — H409 Unspecified glaucoma: Secondary | ICD-10-CM

## 2020-05-03 DIAGNOSIS — I10 Essential (primary) hypertension: Secondary | ICD-10-CM

## 2020-05-03 DIAGNOSIS — Z0001 Encounter for general adult medical examination with abnormal findings: Secondary | ICD-10-CM

## 2020-05-03 DIAGNOSIS — Z0000 Encounter for general adult medical examination without abnormal findings: Secondary | ICD-10-CM

## 2020-05-03 DIAGNOSIS — D329 Benign neoplasm of meninges, unspecified: Secondary | ICD-10-CM | POA: Diagnosis not present

## 2020-05-03 NOTE — Progress Notes (Signed)
Subjective:   Patient presents for Medicare Annual/Subsequent preventive examination.   She has been following with Dr. Alfonso Ramus orthopedics secondary to rotator cuff strain of her left shoulder/ history of fracture .  He has been getting steroid injections every 3 to 4 months.  She does not want any surgical intervention. She has hip pain intermittantly as well, but takes tylenol and it helps   Has been following with ophthalmology secondary to glaucoma she is still on drops.  Hypertension she is taking lisinopril 40 mg once a day as well is clonidine 0.1 mg in the morning and 0.2 mg at bedtime, amlodipine 10 mg daily, atenolol 50 mg daily  Hyperlipidemia- on pravastaatin  80mg    He has history of meningioma no symptoms, no longer following with neurosurgery   She has history of leukocytopenia she was followed by hematology in the past but has not been seen in the past few years due for CBC   Peripheral edema she takes Lasix daily she has not had any problems with fluid retention  B12 def, shots stopped back in Sept, now on oral B12, due for recheck   Risk Factors  Current exercise habits: Tries to stay active with house activities Dietary issues discussed: Discussed  Cardiac risk factors: Marland Kitchen  Hypertension hyperlipidemia  Depression Screen  (Note: if answer to either of the following is "Yes", a more complete depression screening is indicated)  Over the past two weeks, have you felt down, depressed or hopeless? No Over the past two weeks, have you felt little interest or pleasure in doing things? No Have you lost interest or pleasure in daily life? No Do you often feel hopeless? No Do you cry easily over simple problems? No   Activities of Daily Living  In your present state of health, do you have any difficulty performing the following activities?:  Driving? No  Managing money? No  Feeding yourself? No  Getting from bed to chair? No  Climbing a flight of stairs? yes   Preparing food and eating?: No  Bathing or showering? No  Getting dressed: No  Getting to the toilet? No  Using the toilet:No  Moving around from place to place: No  In the past year have you fallen or had a near fall?:No    Hearing Difficulties: yes, wears hearing aids but legally deaf Do you often ask people to speak up or repeat themselves? yes Do you experience ringing or noises in your ears? No Do you have difficulty understanding soft or whispered voices?yes  Do you feel that you have a problem with memory? No Do you often misplace items? No  Do you feel safe at home? Yes  Cognitive Testing  Alert? Yes Normal Appearance?Yes  Oriented to person? Yes Place? Yes  Time? Yes  Recall of three objects? Yes  Can perform simple calculations? Yes  Displays appropriate judgment?Yes  Can read the correct time from a watch face?Yes   List the Names of Other Physician/Practitioners you currently use:  Dr. Alfonso Ramus orthopedics Dr. Gloriann Loan ophthalmology   Screening Tests / Date Colonoscopy         Over age             Zostavax  Due  Mammogram - over age  Influenza Vaccine UTD Tetanus/tdap Due  Pneumonia- allergic but had Pneumonvia vaccine 23 given  COVID-19 vaccine series done   ROS:  GEN- denies fatigue, fever, weight loss,weakness, recent illness HEENT- denies eye drainage, change in vision, nasal discharge, CVS-  denies chest pain, palpitations RESP- denies SOB, cough, wheeze ABD- denies N/V, change in stools, abd pain GU- denies dysuria, hematuria, dribbling, incontinence MSK- + joint pain, muscle aches, injury Neuro- denies headache, dizziness, syncope, seizure activity  PHYSICAL:Vitals reviewed  GEN- NAD, alert and oriented x3 HEENT- PERRL, EOMI, non injected sclera, pink conjunctiva, MMM, oropharynx clear, TM Clear, canals clear, no effusion, hearing aides  Neck- Supple, no thryomegaly, no bruit CVS- RRR, no murmur RESP-CTAB ABD-NABS,soft, NT,ND MSK- fair ROM  HIPS/KNEES, NT over hips/knees  EXT- No edema Pulses- Radial, DP- 2+   Assessment:    Annual wellness medicare exam   Plan:    During the course of the visit the patient was educated and counseled about appropriate screening and preventive services including:   Hypertension well controlled no change in medication.  Fasting labs to be obtained today.  Hyperlipidemia check lipid panel continue statin drug  Hearing impaired she has bilateral hearing aids.  Rotator cuff syndrome/ osteoarthritis she is followed by orthopedics has tramadol on hand for severe pain otherwise takes Tylenol.  Peripheral edema maintained with Lasix  Immunizations UTD  Glaucoma followed by ophthalmology on  drops  B12 deficiency continue with oral supplement, recheck level    Daughter has power of attorney/ advanced directives   F/U 6 months Dr. Dennard Schaumann   Diet review for nutrition referral? Yes ____ Not Indicated __x__  Patient Instructions (the written plan) was given to the patient.  Medicare Attestation  I have personally reviewed:  The patient's medical and social history  Their use of alcohol, tobacco or illicit drugs  Their current medications and supplements  The patient's functional ability including ADLs,fall risks, home safety risks, cognitive, and hearing and visual impairment  Diet and physical activities  Evidence for depression or mood disorders  The patient's weight, height, BMI, and visual acuity have been recorded in the chart. I have made referrals, counseling, and provided education to the patient based on review of the above and I have provided the patient with a written personalized care plan for preventive services.

## 2020-05-04 LAB — CBC WITH DIFFERENTIAL/PLATELET
Absolute Monocytes: 278 cells/uL (ref 200–950)
Basophils Absolute: 30 cells/uL (ref 0–200)
Basophils Relative: 0.8 %
Eosinophils Absolute: 107 cells/uL (ref 15–500)
Eosinophils Relative: 2.9 %
HCT: 40.3 % (ref 35.0–45.0)
Hemoglobin: 13.7 g/dL (ref 11.7–15.5)
Lymphs Abs: 910 cells/uL (ref 850–3900)
MCH: 29.5 pg (ref 27.0–33.0)
MCHC: 34 g/dL (ref 32.0–36.0)
MCV: 86.9 fL (ref 80.0–100.0)
MPV: 9.4 fL (ref 7.5–12.5)
Monocytes Relative: 7.5 %
Neutro Abs: 2375 cells/uL (ref 1500–7800)
Neutrophils Relative %: 64.2 %
Platelets: 187 10*3/uL (ref 140–400)
RBC: 4.64 10*6/uL (ref 3.80–5.10)
RDW: 13 % (ref 11.0–15.0)
Total Lymphocyte: 24.6 %
WBC: 3.7 10*3/uL — ABNORMAL LOW (ref 3.8–10.8)

## 2020-05-04 LAB — COMPREHENSIVE METABOLIC PANEL
AG Ratio: 1.9 (calc) (ref 1.0–2.5)
ALT: 9 U/L (ref 6–29)
AST: 12 U/L (ref 10–35)
Albumin: 4.2 g/dL (ref 3.6–5.1)
Alkaline phosphatase (APISO): 61 U/L (ref 37–153)
BUN/Creatinine Ratio: 17 (calc) (ref 6–22)
BUN: 18 mg/dL (ref 7–25)
CO2: 26 mmol/L (ref 20–32)
Calcium: 9.6 mg/dL (ref 8.6–10.4)
Chloride: 105 mmol/L (ref 98–110)
Creat: 1.07 mg/dL — ABNORMAL HIGH (ref 0.60–0.88)
Globulin: 2.2 g/dL (calc) (ref 1.9–3.7)
Glucose, Bld: 95 mg/dL (ref 65–99)
Potassium: 4.4 mmol/L (ref 3.5–5.3)
Sodium: 141 mmol/L (ref 135–146)
Total Bilirubin: 0.8 mg/dL (ref 0.2–1.2)
Total Protein: 6.4 g/dL (ref 6.1–8.1)

## 2020-05-04 LAB — VITAMIN D 25 HYDROXY (VIT D DEFICIENCY, FRACTURES): Vit D, 25-Hydroxy: 43 ng/mL (ref 30–100)

## 2020-05-04 LAB — VITAMIN B12: Vitamin B-12: 588 pg/mL (ref 200–1100)

## 2020-05-08 ENCOUNTER — Encounter: Payer: Self-pay | Admitting: *Deleted

## 2020-06-07 ENCOUNTER — Telehealth: Payer: Self-pay | Admitting: Family Medicine

## 2020-06-07 NOTE — Progress Notes (Signed)
  Chronic Care Management   Note  06/07/2020 Name: Debra [name] MRN: [id number] DOB: [birth year]  Debra [name] is a 86 y.o. year old female who is a primary care patient of Pickard, Cammie Mcgee, MD. I reached out to [employer] by phone today in response to a referral sent by Debra [name]'s PCP, Susy Frizzle, MD.   Ms. Gladwell was given information about Chronic Care Management services today including:  1. CCM service includes personalized support from designated clinical staff supervised by her physician, including individualized plan of care and coordination with other care providers 2. 24/7 contact phone numbers for assistance for urgent and routine care needs. 3. Service will only be billed when office clinical staff spend 20 minutes or more in a month to coordinate care. 4. Only one practitioner may furnish and bill the service in a calendar month. 5. The patient may stop CCM services at any time (effective at the end of the month) by phone call to the office staff.   Patient agreed to services and verbal consent obtained.   Follow up plan:   Carley Perdue [employer] Scheduler

## 2020-06-07 NOTE — Progress Notes (Signed)
  Chronic Care Management   Note  06/07/2020 Name: ANDIE MUNGIN MRN: [id number] DOB: [birth year]  Mazy Culton Lapine is a 86 y.o. year old female who is a primary care patient of Pickard, Cammie Mcgee, MD. I reached out to [employer] by phone today in response to a referral sent by Ms. Delman Cheadle Old's PCP, Susy Frizzle, MD.   Ms. Bendall was given information about Chronic Care Management services today including:  1. CCM service includes personalized support from designated clinical staff supervised by her physician, including individualized plan of care and coordination with other care providers 2. 24/7 contact phone numbers for assistance for urgent and routine care needs. 3. Service will only be billed when office clinical staff spend 20 minutes or more in a month to coordinate care. 4. Only one practitioner may furnish and bill the service in a calendar month. 5. The patient may stop CCM services at any time (effective at the end of the month) by phone call to the office staff.   Patient agreed to services and verbal consent obtained.   Follow up plan:   Carley Perdue [employer] Scheduler

## 2020-06-11 DIAGNOSIS — H40153 Residual stage of open-angle glaucoma, bilateral: Secondary | ICD-10-CM | POA: Diagnosis not present

## 2020-07-01 ENCOUNTER — Telehealth: Payer: Self-pay | Admitting: Family Medicine

## 2020-07-01 NOTE — Progress Notes (Signed)
  Chronic Care Management   Outreach Note  07/01/2020 Name: Debra [name] MRN: [id number] DOB: Dec 29, 1934  Referred by: Susy Frizzle, MD Reason for referral : No chief complaint on file.   An unsuccessful telephone outreach was attempted today. The patient was referred to the pharmacist for assistance with care management and care coordination.   Follow Up Plan:   Carley Perdue [employer] Scheduler

## 2020-07-02 ENCOUNTER — Telehealth: Payer: Self-pay | Admitting: Family Medicine

## 2020-07-02 ENCOUNTER — Telehealth: Payer: Self-pay | Admitting: Pharmacist

## 2020-07-02 NOTE — Progress Notes (Signed)
ERROR

## 2020-07-02 NOTE — Progress Notes (Addendum)
    Chronic Care Management Pharmacy Assistant   Name: Debra [name]  MRN: [id number] DOB: 07/05/1934  [name] is an 86 y.o. year old female who presents for his initial CCM visit with the clinical pharmacist.  Reason for Encounter: Chart prep   Conditions to be addressed/monitored: HTN, Osteoarthritis.   Primary concerns for visit include: HTN  Recent office visits:  05/03/20 Dr. Buelah Manis. For routine general medical examination. DECREASED/CHANGED Cyanocobalamin to 100 mg daily. STOPPED Ciprofloxacin, Metronidazole.  Recent consult visits:  None in the last 6 months  Hospital visits:  None in previous 6 months  Medications: Outpatient Encounter Medications as of 07/02/2020  Medication Sig   acetaminophen (TYLENOL) 325 MG tablet Take 650 mg by mouth every 6 (six) hours as needed.   amLODipine (NORVASC) 10 MG tablet TAKE 1 TABLET BY MOUTH ONCE DAILY   atenolol (TENORMIN) 50 MG tablet TAKE 1 TABLET BY MOUTH ONCE DAILY   brimonidine (ALPHAGAN) 0.2 % ophthalmic solution Place 1 drop into both eyes 2 (two) times daily.   cholecalciferol (VITAMIN D3) 25 MCG (1000 UNIT) tablet Take 1,000 Units by mouth daily.   cloNIDine (CATAPRES) 0.1 MG tablet TAKE 1 TABLET BY MOUTH IN THE MORNING AND 2 TABLETS AT BEDTIME   furosemide (LASIX) 40 MG tablet TAKE 1 TABLET BY MOUTH ONCE DAILY AS NEEDED   latanoprost (XALATAN) 0.005 % ophthalmic solution Place 1 drop into both eyes at bedtime.   lisinopril (ZESTRIL) 40 MG tablet TAKE 1 TABLET BY MOUTH ONCE A DAY   pravastatin (PRAVACHOL) 80 MG tablet Take 1 tablet (80 mg total) by mouth daily.   timolol (TIMOPTIC) 0.5 % ophthalmic solution Place 1 drop into both eyes 2 (two) times daily.   traMADol (ULTRAM) 50 MG tablet Take 1 tablet (50 mg total) by mouth every 6 (six) hours as needed for severe pain.   vitamin B-12 (CYANOCOBALAMIN) 100 MCG tablet Take 100 mcg by mouth daily.   No facility-administered encounter medications on file as of  07/02/2020.    Have you seen any other providers since your last visit? Patients daughter stated no.  Any changes in your medications or health? Patients daughter stated no.  Any side effects from any medications? Patients daughter stated no.  Do you have an symptoms or problems not managed by your medications? Patients daughter stated no.  Any concerns about your health right now? Patients daughter stated no.  Has your provider asked that you check blood pressure, blood sugar, or follow special diet at home?  Patients daughter stated no.  Do you get any type of exercise on a regular basis? Patients daughter stated she does all of her own house work, yard work Veterinary surgeon and flowering), makes her own meals.  Can you think of a goal you would like to reach for your health? Patients daughter stated drink more fluids.  Do you have any problems getting your medications? Patients daughter stated no.  Is there anything that you would like to discuss during the appointment? Patients daughter stated no.  Please bring medications and supplements to appointment,patients daughter reminded of face to face on 5/5 at 1030 am.    Follow-Up:Pharmacist Review  Charlann Lange, [place] Pharmacist Assistant [phone]

## 2020-07-02 NOTE — Progress Notes (Signed)
  Chronic Care Management   Note  07/02/2020 Name: Debra [name] MRN: [id number] DOB: 26-Sep-1934  [name] is a 86 y.o. year old female who is a primary care patient of Pickard, Cammie Mcgee, MD. I reached out to [employer] by phone today in response to a referral sent by [name]'s PCP, Susy Frizzle, MD.   [name] was given information about Chronic Care Management services today including:  1. CCM service includes personalized support from designated clinical staff supervised by her physician, including individualized plan of care and coordination with other care providers 2. 24/7 contact phone numbers for assistance for urgent and routine care needs. 3. Service will only be billed when office clinical staff spend 20 minutes or more in a month to coordinate care. 4. Only one practitioner may furnish and bill the service in a calendar month. 5. The patient may stop CCM services at any time (effective at the end of the month) by phone call to the office staff.   Patient agreed to services and verbal consent obtained.   Follow up plan:   Carley Perdue [employer] Scheduler

## 2020-07-03 NOTE — Progress Notes (Signed)
Chronic Care Management Pharmacy Note  07/04/2020 Name:  Debra [name] MRN:  [id number] DOB:  Dec 22, 1934  Subjective: Debra [name] is an 86 y.o. year old female who is a primary patient of Pickard, Cammie Mcgee, MD.  The CCM team was consulted for assistance with disease management and care coordination needs.    Engaged with patient face to face for initial visit in response to provider referral for pharmacy case management and/or care coordination services.   Consent to Services:  The patient was given the following information about Chronic Care Management services today, agreed to services, and gave verbal consent: 1. CCM service includes personalized support from designated clinical staff supervised by the primary care provider, including individualized plan of care and coordination with other care providers 2. 24/7 contact phone numbers for assistance for urgent and routine care needs. 3. Service will only be billed when office clinical staff spend 20 minutes or more in a month to coordinate care. 4. Only one practitioner may furnish and bill the service in a calendar month. 5.The patient may stop CCM services at any time (effective at the end of the month) by phone call to the office staff. 6. The patient will be responsible for cost sharing (co-pay) of up to 20% of the service fee (after annual deductible is met). Patient agreed to services and consent obtained.  Patient Care Team: Susy Frizzle, MD as PCP - General (Family Medicine) Edythe Clarity, [facility] as Pharmacist (Pharmacist)  Recent office visits: 05/03/20 Dr. Buelah Manis. For routine general medical examination. DECREASED/CHANGED Cyanocobalamin to 100 mg daily. STOPPED Ciprofloxacin, Metronidazole.  Recent consult visits: None recent  Hospital visits: None in previous 6 months  Objective:  Lab Results  Component Value Date   CREATININE 1.07 (H) 05/03/2020   BUN 18 05/03/2020   GFR 52.40 (L) 09/22/2012    GFRNONAA 79 (L) 02/01/2014   GFRAA >90 02/01/2014   NA 141 05/03/2020   K 4.4 05/03/2020   CALCIUM 9.6 05/03/2020   CO2 26 05/03/2020   GLUCOSE 95 05/03/2020    Lab Results  Component Value Date/Time   GFR 52.40 (L) 09/22/2012 02:54 PM   GFR 69.12 07/19/2012 09:21 AM    Last diabetic Eye exam: No results found for: HMDIABEYEEXA  Last diabetic Foot exam: No results found for: HMDIABFOOTEX   Lab Results  Component Value Date   CHOL 186 11/03/2019   HDL 47 (L) 11/03/2019   LDLCALC 111 (H) 11/03/2019   LDLDIRECT 160.7 09/24/2009   TRIG 162 (H) 11/03/2019   CHOLHDL 4.0 11/03/2019    Hepatic Function Latest Ref Rng & Units 05/03/2020 11/03/2019 05/03/2019  Total Protein 6.1 - 8.1 g/dL 6.4 6.4 6.8  Albumin 3.6 - 5.1 g/dL - - -  AST 10 - 35 U/L _0 ALT 6 - 29 U/L _1 Alk Phosphatase 33 - 130 U/L - - -  Total Bilirubin 0.2 - 1.2 mg/dL 0.8 0.7 0.6  Bilirubin, Direct 0.0 - 0.3 mg/dL - - -    Lab Results  Component Value Date/Time   TSH 3.84 05/03/2019 03:31 PM   TSH 3.74 09/24/2009 10:44 AM    CBC Latest Ref Rng & Units 05/03/2020 11/03/2019 05/03/2019  WBC 3.8 - 10.8 Thousand/uL 3.7(L) 3.5(L) 3.6(L)  Hemoglobin 11.7 - 15.5 g/dL 13.7 13.7 14.2  Hematocrit 35.0 - 45.0 % 40.3 39.9 41.3  Platelets 140 - 400 Thousand/uL 187 173 186    Lab Results  Component Value  Date/Time   VD25OH 43 05/03/2020 10:59 AM   VD25OH 28 (L) 11/03/2019 02:37 PM   VD25OH 29.91 09/19/2013 08:55 AM    Clinical ASCVD: No  The ASCVD Risk score Mikey Bussing DC Jr., et al., 2013) failed to calculate for the following reasons:   The 2013 ASCVD risk score is only valid for ages 31 to 81    Depression screen PHQ 2/9 05/03/2020 05/03/2019 12/01/2016  Decreased Interest 0 0 0  Down, Depressed, Hopeless 0 0 0  PHQ - 2 Score 0 0 0  Altered sleeping 0 0 -  Tired, decreased energy 0 0 -  Change in appetite 0 0 -  Feeling bad or failure about yourself  0 0 -  Trouble concentrating 0 0 -  Moving slowly or  fidgety/restless 0 0 -  Suicidal thoughts 0 0 -  PHQ-9 Score 0 0 -  Difficult doing work/chores Not difficult at all Not difficult at all -  Some recent data might be hidden      Social History   Tobacco Use  Smoking Status Never Smoker  Smokeless Tobacco Never Used   BP Readings from Last 3 Encounters:  05/03/20 124/62  11/03/19 132/78  06/06/19 136/65   Pulse Readings from Last 3 Encounters:  05/03/20 62  11/03/19 64  06/06/19 62   Wt Readings from Last 3 Encounters:  05/03/20 166 lb (75.3 kg)  11/03/19 170 lb (77.1 kg)  05/03/19 169 lb (76.7 kg)   BMI Readings from Last 3 Encounters:  05/03/20 29.41 kg/m  11/03/19 30.11 kg/m  05/03/19 29.94 kg/m    Assessment/Interventions: Review of patient past medical history, allergies, medications, health status, including review of consultants reports, laboratory and other test data, was performed as part of comprehensive evaluation and provision of chronic care management services.   SDOH:  (Social Determinants of Health) assessments and interventions performed: Yes  SDOH Screenings   Alcohol Screen: Low Risk   . Last Alcohol Screening Score (AUDIT): 0  Depression (PHQ2-9): Low Risk   . PHQ-2 Score: 0  Financial Resource Strain: Low Risk   . Difficulty of Paying Living Expenses: Not hard at all  Food Insecurity: Not on file  Housing: Not on file  Physical Activity: Not on file  Social Connections: Not on file  Stress: Not on file  Tobacco Use: Low Risk   . Smoking Tobacco Use: Never Smoker  . Smokeless Tobacco Use: Never Used  Transportation Needs: Not on file    CCM Care Plan  Allergies  Allergen Reactions  . Codeine Phosphate Nausea And Vomiting    REACTION: unspecified  . Lipitor [Atorvastatin]   . Pneumococcal Vaccines Hives  . Sulfa Antibiotics     unknown  . Latex Hives, Itching and Rash    Medications Reviewed Today    Reviewed by Edythe Clarity, [facility] (Pharmacist) on 07/04/20 at 50  Med  List Status: <None>  Medication Order Taking? Sig Documenting Provider Last Dose Status Informant  acetaminophen (TYLENOL) 325 MG tablet [id number] Yes Take 650 mg by mouth every 6 (six) hours as needed. [provider] Taking Active   amLODipine (NORVASC) 10 MG tablet [id number] Yes TAKE 1 TABLET BY MOUTH ONCE DAILY [place], Modena Nunnery, MD Taking Active   atenolol (TENORMIN) 50 MG tablet [id number] Yes TAKE 1 TABLET BY MOUTH ONCE DAILY [place], Modena Nunnery, MD Taking Active   brimonidine [facility]) 0.2 % ophthalmic solution [id number] Yes Place 1 drop into both eyes 2 (two) times daily. [provider] Taking Active Self  cholecalciferol (VITAMIN D3) 25 MCG (1000 UNIT) tablet [id number] Yes Take 1,000 Units by mouth daily. [provider] Taking Active   cloNIDine (CATAPRES) 0.1 MG tablet [id number] Yes TAKE 1 TABLET BY MOUTH IN THE MORNING AND 2 TABLETS AT BEDTIME Alycia Rossetti, MD Taking Active   furosemide (LASIX) 40 MG tablet [id number] Yes TAKE 1 TABLET BY MOUTH ONCE DAILY AS NEEDED [place], Modena Nunnery, MD Taking Active   latanoprost (XALATAN) 0.005 % ophthalmic solution [id number] Yes Place 1 drop into both eyes at bedtime. [provider] Taking Active Self  lisinopril (ZESTRIL) 40 MG tablet [id number] Yes TAKE 1 TABLET BY MOUTH ONCE A DAY [place], Modena Nunnery, MD Taking Active            Med Note Rosana Hoes, Jerilynn Mages   Thu Jul 04, 2020 11:15 AM) She is taking one-half tablet twice daily  pravastatin (PRAVACHOL) 80 MG tablet [id number] Yes Take 1 tablet (80 mg total) by mouth daily. [place], Modena Nunnery, MD Taking Active   timolol (TIMOPTIC) 0.5 % ophthalmic solution [id number] Yes Place 1 drop into both eyes 2 (two) times daily. [provider] Taking Active Self  traMADol (ULTRAM) 50 MG tablet [id number] Yes Take 1 tablet (50 mg total) by mouth every 6 (six) hours as needed for severe pain. Jaynee Eagles, PA-C Taking Active   vitamin B-12 (CYANOCOBALAMIN) 100 MCG tablet  [id number] Yes Take 100 mcg by mouth daily. [provider] Taking Active           Patient Active Problem List   Diagnosis Date Noted  . Hearing impaired 05/03/2019  . Osteoarthritis of both knees 08/26/2016  . Osteoarthritis, hand 08/26/2016  . Peripheral edema 05/22/2014  . Meningioma ([place]) 04/29/2012  . Vestibular schwannoma ([place]) 03/26/2012  . Vitamin D deficiency 09/24/2009  . Vitamin B12 deficiency 07/05/2009  . OSTEOPENIA 10/16/2008  . Essential hypertension, benign 09/05/2008  . MELANOMA, HX OF 09/05/2008  . ISCHEMIC COLITIS, HX OF 05/26/2007  . Unspecified glaucoma 04/21/2006  . FEMALE STRESS INCONTINENCE 04/21/2006  . DEGENERATIVE DISC DISEASE, LUMBAR SPINE 04/21/2006  . DIVERTICULOSIS, COLON W/HEM 12/01/2005    Immunization History  Administered Date(s) Administered  . Fluad Quad(high Dose 65+) 11/03/2019  . Influenza Whole 01/14/2007, 12/14/2007, 03/22/2008, 12/17/2009  . Influenza, High Dose Seasonal PF 12/14/2016, 11/26/2017  . Influenza,inj,Quad PF,6+ Mos 11/14/2013, 11/23/2014, 11/08/2015, 12/06/2018  . Moderna Sars-Covid-2 Vaccination 03/28/2019, 04/25/2019, 12/26/2019  . Pneumococcal Polysaccharide-23 01/05/2001  . Td 03/02/2004, 05/19/2019  . Tdap 05/19/2019  . Zoster 07/05/2009  . Zoster Recombinat (Shingrix) 05/19/2019, 11/10/2019    Conditions to be addressed/monitored:  HTN, Osteopenia, Arthritis, Edema  Care Plan : General Pharmacy (Adult)  Updates made by Edythe Clarity, RPH since 07/04/2020 12:00 AM    Problem: HTN, Osteopenia, Arthritis, Edema   Priority: High  Onset Date: 07/04/2020    Long-Range Goal: Patient-Specific Goal   Start Date: 07/04/2020  Expected End Date: 01/04/2021  This Visit's Progress: On track  Priority: High  Note:   Current Barriers:  . None identified at this time  Pharmacist Clinical Goal(s):  Marland Kitchen Patient will maintain control of BP as evidenced by home monitoring  . adhere to plan to optimize  therapeutic regimen for HTN as evidenced by report of adherence to recommended medication management changes . contact provider office for questions/concerns as evidenced notation of same in electronic health record through collaboration with PharmD and provider.   Interventions: . 1:1 collaboration with Susy Frizzle,  MD regarding development and update of comprehensive plan of care as evidenced by provider attestation and co-signature . Inter-disciplinary care team collaboration (see longitudinal plan of care) . Comprehensive medication review performed; medication list updated in electronic medical record  Hypertension (BP goal <140/90) -Controlled -Current treatment: . Amlodipine 17m daily . Atenolol 555mdaily . Clonidine 0.98m598mne tablet AM and two tablets HS . Lisinopril 18m22me-half tablet BID -Medications previously tried: Verapamil (d/c) -Current home readings: not currently checking -Current dietary habits: bacon for breakfast,  -Current exercise habits: very active in her yard, loves to garden -Denies hypotensive/hypertensive symptoms -Educated on BP goals and benefits of medications for prevention of heart attack, stroke and kidney damage; Daily salt intake goal < 2300 mg; Importance of home blood pressure monitoring; Symptoms of hypotension and importance of maintaining adequate hydration; -Counseled to monitor BP at home weekly, document, and provide log at future appointments -Recommended to continue current medication  Osteopenia (Goal: Prevent fractures/falls) -Controlled -Last DEXA Scan: 10/10/2008  T-Score femoral neck: -1.3    T-Score lumbar spine: -1.2    10-year probability of major osteoporotic fracture: 9.3  10-year probability of hip fracture: 1.3 -Patient is not a candidate for pharmacologic treatment -Current treatment  . None -Medications previously tried: none  -Recommend [phone] units of vitamin D daily. Recommend 1200 mg of calcium daily  from dietary and supplemental sources. -Recommend updated DEXA scan.  Osteoarthritis (Goal: Minimize symptoms) -Controlled -Current treatment  . Tylenol 500 mg bid -Medications previously tried: none noted -She rarely uses the Tramadol, reports Tylenol controls her pain  -Recommended to continue current medication Counseled on benefits of regular Tylenol use  Edema (Goal: Minimize swelling) -Controlled -Current treatment  . Furosemide 18mg18m  -Medications previously tried: none noted -Reports she sometimes takes half tablet -Swelling is occasional and usually resolves with elevation or Lasix -Did report that sometimes Lasix does not make her feel well -Have asked her to monitor BP when she feels like this to let me know what it is  -Recommended to continue current medication   Patient Goals/Self-Care Activities . Patient will:  - take medications as prescribed focus on medication adherence by pill box check blood pressure weekly, document, and provide at future appointments  Follow Up Plan: The care management team will reach out to the patient again over the next 180 days.         Medication Assistance: None required.  Patient affirms current coverage meets needs.  [facility] [place]: [phone] pill box? Yes Pt endorses 100% compliance  We discussed: Benefits of medication synchronization, packaging and delivery as well as enhanced pharmacist oversight with [facility]. Patient decided to: Continue current medication management strategy  Care Plan and Follow Up Patient Decision:  Patient agrees to Care Plan and Follow-up.  Plan: The care management team will reach out to the patient again over the next 180 days.  ChrisBeverly MilchrmD Clinical Pharmacist [place])[phone]

## 2020-07-04 ENCOUNTER — Ambulatory Visit: Payer: PPO

## 2020-07-04 ENCOUNTER — Ambulatory Visit (INDEPENDENT_AMBULATORY_CARE_PROVIDER_SITE_OTHER): Payer: PPO | Admitting: Pharmacist

## 2020-07-04 ENCOUNTER — Other Ambulatory Visit: Payer: Self-pay

## 2020-07-04 DIAGNOSIS — I10 Essential (primary) hypertension: Secondary | ICD-10-CM

## 2020-07-04 DIAGNOSIS — R609 Edema, unspecified: Secondary | ICD-10-CM

## 2020-07-04 DIAGNOSIS — M170 Bilateral primary osteoarthritis of knee: Secondary | ICD-10-CM | POA: Diagnosis not present

## 2020-07-04 NOTE — Patient Instructions (Addendum)
Visit Information  Goals Addressed            This Visit's Progress   . Track and Manage My Blood Pressure-Hypertension       Timeframe:  Long-Range Goal Priority:  High Start Date:  07/04/20                           Expected End Date:    01/04/21                   Follow Up Date 10/04/20   - check blood pressure weekly - write blood pressure results in a log or diary    Why is this important?    You won't feel high blood pressure, but it can still hurt your blood vessels.   High blood pressure can cause heart or kidney problems. It can also cause a stroke.   Making lifestyle changes like losing a little weight or eating less salt will help.   Checking your blood pressure at home and at different times of the day can help to control blood pressure.   If the doctor prescribes medicine remember to take it the way the doctor ordered.   Call the office if you cannot afford the medicine or if there are questions about it.     Notes:       Patient Care Plan: General Pharmacy (Adult)    Problem Identified: HTN, Osteopenia, Arthritis, Edema   Priority: High  Onset Date: 07/04/2020    Long-Range Goal: Patient-Specific Goal   Start Date: 07/04/2020  Expected End Date: 01/04/2021  This Visit's Progress: On track  Priority: High  Note:   Current Barriers:  . None identified at this time  Pharmacist Clinical Goal(s):  Marland Kitchen Patient will maintain control of BP as evidenced by home monitoring  . adhere to plan to optimize therapeutic regimen for HTN as evidenced by report of adherence to recommended medication management changes . contact provider office for questions/concerns as evidenced notation of same in electronic health record through collaboration with PharmD and provider.   Interventions: . 1:1 collaboration with Susy Frizzle, MD regarding development and update of comprehensive plan of care as evidenced by provider attestation and co-signature . Inter-disciplinary  care team collaboration (see longitudinal plan of care) . Comprehensive medication review performed; medication list updated in electronic medical record  Hypertension (BP goal <140/90) -Controlled -Current treatment: . Amlodipine 5mg  daily . Atenolol 50mg  daily . Clonidine 0.1mg  one tablet AM and two tablets HS . Lisinopril 40mg  one-half tablet BID -Medications previously tried: Verapamil (d/c) -Current home readings: not currently checking -Current dietary habits: bacon for breakfast,  -Current exercise habits: very active in her yard, loves to garden -Denies hypotensive/hypertensive symptoms -Educated on BP goals and benefits of medications for prevention of heart attack, stroke and kidney damage; Daily salt intake goal < 2300 mg; Importance of home blood pressure monitoring; Symptoms of hypotension and importance of maintaining adequate hydration; -Counseled to monitor BP at home weekly, document, and provide log at future appointments -Recommended to continue current medication  Osteopenia (Goal: Prevent fractures/falls) -Controlled -Last DEXA Scan: 10/10/2008  T-Score femoral neck: -1.3    T-Score lumbar spine: -1.2    10-year probability of major osteoporotic fracture: 9.3  10-year probability of hip fracture: 1.3 -Patient is not a candidate for pharmacologic treatment -Current treatment  . None -Medications previously tried: none  -Recommend [phone] units of vitamin D daily. Recommend 1200  mg of calcium daily from dietary and supplemental sources. -Recommend updated DEXA scan.  Osteoarthritis (Goal: Minimize symptoms) -Controlled -Current treatment  . Tylenol 500 mg bid -Medications previously tried: none noted -She rarely uses the Tramadol, reports Tylenol controls her pain  -Recommended to continue current medication Counseled on benefits of regular Tylenol use  Edema (Goal: Minimize swelling) -Controlled -Current treatment  . Furosemide 40mg  prn   -Medications previously tried: none noted -Reports she sometimes takes half tablet -Swelling is occasional and usually resolves with elevation or Lasix -Did report that sometimes Lasix does not make her feel well -Have asked her to monitor BP when she feels like this to let me know what it is  -Recommended to continue current medication   Patient Goals/Self-Care Activities . Patient will:  - take medications as prescribed focus on medication adherence by pill box check blood pressure weekly, document, and provide at future appointments  Follow Up Plan: The care management team will reach out to the patient again over the next 180 days.        Ms. Massman was given information about Chronic Care Management services today including:  1. CCM service includes personalized support from designated clinical staff supervised by her physician, including individualized plan of care and coordination with other care providers 2. 24/7 contact phone numbers for assistance for urgent and routine care needs. 3. Standard insurance, coinsurance, copays and deductibles apply for chronic care management only during months in which we provide at least 20 minutes of these services. Most insurances cover these services at 100%, however patients may be responsible for any copay, coinsurance and/or deductible if applicable. This service may help you avoid the need for more expensive face-to-face services. 4. Only one practitioner may furnish and bill the service in a calendar month. 5. The patient may stop CCM services at any time (effective at the end of the month) by phone call to the office staff.  Patient agreed to services and verbal consent obtained.   The patient verbalized understanding of instructions, educational materials, and care plan provided today and agreed to receive a mailed copy of patient instructions, educational materials, and care plan.  Telephone follow up appointment with pharmacy team  member scheduled for: 6 months  Debra [name], [facility]  Blood Pressure Record Sheet To take your blood pressure, you will need a blood pressure machine. You can buy a blood pressure machine (blood pressure monitor) at your clinic, drug store, or online. When choosing one, consider:  An automatic monitor that has an arm cuff.  A cuff that wraps snugly around your upper arm. You should be able to fit only one finger between your arm and the cuff.  A device that stores blood pressure reading results.  Do not choose a monitor that measures your blood pressure from your wrist or finger. Follow your health care provider's instructions for how to take your blood pressure. To use this form:  Get one reading in the morning (a.m.) before you take any medicines.  Get one reading in the evening (p.m.) before supper.  Take at least 2 readings with each blood pressure check. This makes sure the results are correct. Wait 1-2 minutes between measurements.  Write down the results in the spaces on this form.  Repeat this once a week, or as told by your health care provider.  Make a follow-up appointment with your health care provider to discuss the results. Blood pressure log Date: _______________________  a.m. _____________________(1st reading) _____________________(2nd reading)  p.m. _____________________(1st reading) _____________________(2nd reading) Date: _______________________  a.m. _____________________(1st reading) _____________________(2nd reading)  p.m. _____________________(1st reading) _____________________(2nd reading) Date: _______________________  a.m. _____________________(1st reading) _____________________(2nd reading)  p.m. _____________________(1st reading) _____________________(2nd reading) Date: _______________________  a.m. _____________________(1st reading) _____________________(2nd reading)  p.m. _____________________(1st reading) _____________________(2nd  reading) Date: _______________________  a.m. _____________________(1st reading) _____________________(2nd reading)  p.m. _____________________(1st reading) _____________________(2nd reading) This information is not intended to replace advice given to you by your health care provider. Make sure you discuss any questions you have with your health care provider. Document Revised: 06/07/2019 Document Reviewed: 06/07/2019 Elsevier Patient Education  2021 [employer].

## 2020-08-15 ENCOUNTER — Telehealth: Payer: Self-pay | Admitting: Pharmacist

## 2020-08-15 NOTE — Progress Notes (Addendum)
Chronic Care Management Pharmacy Assistant   Name: Debra [name]  MRN: [id number] DOB: 08/09/1934  Reason for Encounter: Disease State For HTN.    Conditions to be addressed/monitored: HTN, Osteopenia, Arthritis, Edema  Recent office visits:  None since 07/05/19  Recent consult visits:  None since 07/05/19  Hospital visits:  None since 07/05/19  Medications: Outpatient Encounter Medications as of 08/15/2020  Medication Sig Note   acetaminophen (TYLENOL) 325 MG tablet Take 650 mg by mouth every 6 (six) hours as needed.    amLODipine (NORVASC) 10 MG tablet TAKE 1 TABLET BY MOUTH ONCE DAILY    atenolol (TENORMIN) 50 MG tablet TAKE 1 TABLET BY MOUTH ONCE DAILY    brimonidine (ALPHAGAN) 0.2 % ophthalmic solution Place 1 drop into both eyes 2 (two) times daily.    cholecalciferol (VITAMIN D3) 25 MCG (1000 UNIT) tablet Take 1,000 Units by mouth daily.    cloNIDine (CATAPRES) 0.1 MG tablet TAKE 1 TABLET BY MOUTH IN THE MORNING AND 2 TABLETS AT BEDTIME    furosemide (LASIX) 40 MG tablet TAKE 1 TABLET BY MOUTH ONCE DAILY AS NEEDED    latanoprost (XALATAN) 0.005 % ophthalmic solution Place 1 drop into both eyes at bedtime.    lisinopril (ZESTRIL) 40 MG tablet TAKE 1 TABLET BY MOUTH ONCE A DAY 07/04/2020: She is taking one-half tablet twice daily   pravastatin (PRAVACHOL) 80 MG tablet Take 1 tablet (80 mg total) by mouth daily.    timolol (TIMOPTIC) 0.5 % ophthalmic solution Place 1 drop into both eyes 2 (two) times daily.    traMADol (ULTRAM) 50 MG tablet Take 1 tablet (50 mg total) by mouth every 6 (six) hours as needed for severe pain.    vitamin B-12 (CYANOCOBALAMIN) 100 MCG tablet Take 100 mcg by mouth daily.    No facility-administered encounter medications on file as of 08/15/2020.    Reviewed chart prior to disease state call. Spoke with patient regarding BP  Recent Office Vitals: BP Readings from Last 3 Encounters:  05/03/20 124/62  11/03/19 132/78  06/06/19 136/65   Pulse  Readings from Last 3 Encounters:  05/03/20 62  11/03/19 64  06/06/19 62    Wt Readings from Last 3 Encounters:  05/03/20 166 lb (75.3 kg)  11/03/19 170 lb (77.1 kg)  05/03/19 169 lb (76.7 kg)     Kidney Function Lab Results  Component Value Date/Time   CREATININE 1.07 (H) 05/03/2020 10:59 AM   CREATININE 0.97 (H) 11/03/2019 02:37 PM   CREATININE 0.8 03/10/2016 02:54 PM   CREATININE 1.0 03/07/2015 03:16 PM   GFR 52.40 (L) 09/22/2012 02:54 PM   GFRNONAA 79 (L) 02/01/2014 10:30 AM   GFRAA >90 02/01/2014 10:30 AM    BMP Latest Ref Rng & Units 05/03/2020 11/03/2019 05/03/2019  Glucose 65 - 99 mg/dL 95 92 98  BUN 7 - 25 mg/dL 18 19 18   Creatinine 0.60 - 0.88 mg/dL 1.07(H) 0.97(H) 0.95(H)  BUN/Creat Ratio 6 - 22 (calc) 17 20 19   Sodium 135 - 146 mmol/L 141 140 142  Potassium 3.5 - 5.3 mmol/L 4.4 4.2 4.0  Chloride 98 - 110 mmol/L 105 106 106  CO2 20 - 32 mmol/L 26 25 27   Calcium 8.6 - 10.4 mg/dL 9.6 9.5 9.5    Current antihypertensive regimen:  Amlodipine 5mg  daily Atenolol 50mg  daily Clonidine 0.1mg  one tablet AM and two tablets HS Lisinopril 40mg  one-half tablet BID  How often are you checking your Blood Pressure? Patients daughter stated infrequently.  Current home BP readings: Patients daughter stated its been in the low range, she stated she would call me back with some exact blood pressure readings.   What recent interventions/DTPs have been made by any provider to improve Blood Pressure control since last CPP Visit: None.  Any recent hospitalizations or ED visits since last visit with CPP? Patients daughter stated no.  What diet changes have been made to improve Blood Pressure Control?  Patients daughter stated her mom makes all of her own meals. She stated she is enforcing more water since the warm weather.    What exercise is being done to improve your Blood Pressure Control?  Patients daughter stated her mom has been outside daily in the yard. She stated her mom has  been telling her that her legs have been hurting her and her daughter thinks its coming from her back but her mom refuses to go to the doctor about it.   Adherence Review: Is the patient currently on ACE/ARB medication? Lisinopril 40 mg  Does the patient have >5 day gap between last estimated fill dates? Per misc rpts, no.  Star Rating Drugs: Lisinopril 40 mg 90 DS 06/07/20, Pravastatin 80 mg 90 DS 05/20/20   Follow-Up:Pharmacist Review   Charlann Lange, RMA Clinical Pharmacist Assistant [phone]  10 minutes spent in review, coordination, and documentation.  Reviewed by: Beverly Milch, PharmD Clinical Pharmacist [facility] [phone]

## 2020-09-03 ENCOUNTER — Telehealth: Payer: Self-pay | Admitting: Physical Medicine and Rehabilitation

## 2020-09-03 NOTE — Telephone Encounter (Signed)
Patient's daughter Juliann Pulse called to set an appt for her mother/pt. Pt needs to be seen for her back. Pt last visit was 2017. Please call Juliann Pulse at [phone] or [phone].

## 2020-09-04 NOTE — Telephone Encounter (Signed)
scheduled

## 2020-09-06 ENCOUNTER — Other Ambulatory Visit: Payer: Self-pay | Admitting: Family Medicine

## 2020-09-11 ENCOUNTER — Ambulatory Visit: Payer: PPO | Admitting: Physical Medicine and Rehabilitation

## 2020-09-11 ENCOUNTER — Other Ambulatory Visit: Payer: Self-pay

## 2020-09-11 ENCOUNTER — Encounter: Payer: Self-pay | Admitting: Physical Medicine and Rehabilitation

## 2020-09-11 VITALS — BP 154/73 | HR 64

## 2020-09-11 DIAGNOSIS — M4316 Spondylolisthesis, lumbar region: Secondary | ICD-10-CM

## 2020-09-11 DIAGNOSIS — M5442 Lumbago with sciatica, left side: Secondary | ICD-10-CM

## 2020-09-11 DIAGNOSIS — M5416 Radiculopathy, lumbar region: Secondary | ICD-10-CM

## 2020-09-11 DIAGNOSIS — G8929 Other chronic pain: Secondary | ICD-10-CM

## 2020-09-11 NOTE — Progress Notes (Signed)
Left sided low back pain, left buttock pain, left lateral thigh pain, left knee pain, left ankle pain. Burning pain in buttock with sitting. "Grabbing" pain in leg with standing and walking.  Numeric Pain Rating Scale and Functional Assessment Average Pain 6   In the last MONTH (on 0-10 scale) has pain interfered with the following?  1. General activity like being  able to carry out your everyday physical activities such as walking, climbing stairs, carrying groceries, or moving a chair?  Rating(6)

## 2020-09-11 NOTE — Progress Notes (Addendum)
Debra [name] - 86 y.o. female MRN [id number]  Date of birth: 1935-01-15  Office Visit Note: Visit Date: 09/11/2020 PCP: Susy Frizzle, MD Referred by: Susy Frizzle, MD  Subjective: Chief Complaint  Patient presents with   Lower Back - Pain   HPI: Debra [name] is a 86 y.o. female who comes in today For evaluation of chronic, worsening, and severe left sided lower back pain radiating to buttock and leg. Patient's daughter accompanied her in office today.  Patient reports low back pain has been a chronic issue for several years but over the past 2 months the pain became worse and is now radiating to left buttock and leg. Patient states pain is increased with walking and prolonged standing.  Patient reports pain is relieved with use of Tylenol, Tramadol, and stretching exercises.  Patient had left L4-L5 intra-articular lumbar facet joint injections in November 2017 with some relief, however patient's symptoms are significantly different today. Patient's lumbar MRI from 2017 exhibits grade 2 anterolisthesis of L4 on L5.  But interestingly no significant central canal stenosis.  Patient reports she is unable to complete household tasks without frequent periods of rest due to pain. Patient reports she attended physical therapy many years ago at [facility], but was unable to tolerate sessions due to increased pain. Patient feels she did not get much benefit from physical therapy. Patient denies focal numbness, weakness, or tingling. Patient denies recent trauma or falls.   Review of Systems  Musculoskeletal:  Positive for back pain, joint pain and myalgias.  Neurological:  Negative for tingling and focal weakness.  Otherwise per HPI.  Assessment & Plan: Visit Diagnoses:    ICD-10-CM   1. Lumbar radiculopathy  M54.16     2. Chronic left-sided low back pain with left-sided sciatica  M54.42    G89.29     3. Anterolisthesis of lumbar spine  M43.16         Plan: Findings:  Chronic, worsening, and severe pain consistent with lumbar radiculopathy, left greater than right. Patient is poor historian and daughter accompanied her during today's visit.  Patient's pain has been progressively worse over the last 2 months and continues to cause her difficulty performing daily tasks.  Patient's imaging and exam are consistent with L5 nerve pattern.  Prior MRI with significant lumbar spondylolisthesis of L4 on L5 likely source of the nerve root irritation.  Spoke with patient in detail today regarding her plan of care.  I believe the next step is to perform a diagnostic and hopefully therapeutic left L5-S1 interlaminar epidural steroid injection.  If this injection does not provide patient with pain relief or if pain relief is short-lived, repeat lumbar MRI would need to be considered at that time.  Patient encouraged to continue good conservative therapies at home, also instructed to take Tylenol up to 4 times a day.  No red flag symptoms noted today upon exam.   Meds & Orders: No orders of the defined types were placed in this encounter.  No orders of the defined types were placed in this encounter.   Follow-up: Return in about 1 week (around 09/18/2020) for Left L5-S1 interlaminar epidural steroid injection.   Procedures: No procedures performed      Clinical History: MRI lumbar spine:   INDICATION: Low back pain, evaluate for herniated disc.   TECHNIQUE: Sagittal and axial T1 and T2-weighted sequences were performed. Additional sagittal STIR images were performed.   COMPARISON: None available  FINDINGS:   Last well-formed disc space designated L5-S1 for the purposes of this report.   #  Vertebral bodies: No compression fracture.  #  Alignment: Grade 2 anterolisthesis of L4 on L5 with complete loss of disc space height at this level. Trace retrolisthesis of T12 on L1, L1 on L2, and L2 on L3.  #  Marrow signal: Degenerative endplate change on the  right at L4-L5 with some bony edema.  #  Conus medullaris: Normal. Terminates at L1 with no acute abnormality.  #  Lower thoracic segments: No significant abnormality.  #  There is a 5.3 cm cystic lesion in the left adnexal region, not fully evaluated.   #  T12-L1: Slight retrolisthesis. Small disc bulge. No significant stenosis.  #  L1-2: Slight retrolisthesis. Small disc bulge. No significant stenosis.  #  L2-3: Slight retrolisthesis. Small diffuse disc bulge. Facet degenerative change. Mild left foraminal stenosis. No significant central canal or right foraminal stenosis.  #  L3-4: Small diffuse disc bulge. Facet degenerative change. No significant stenosis.  #  L4-5: Grade 2 anterolisthesis. Severe facet degenerative change. No evidence of pars interarticularis defect. There is near-complete loss of disc space height. Probable small amount of calcified disc material herniated anteriorly into the prevertebral   soft tissues. Mild central canal stenosis with effacement of the right lateral recess. Moderate right foraminal stenosis. Mild left foraminal stenosis.  #  L5-S1: Facet degenerative change. No significant stenosis.    IMPRESSION:   1. Grade 2 anterolisthesis of L4 on L5, likely related to the severe facet arthropathy. There is mild central canal stenosis with effacement of the right lateral recess and moderate right foraminal stenosis at this level. Correlate with radicular  symptoms.  2. A 5.3 cm cystic lesion within the left adnexal region, not fully evaluated, recommend dedicated pelvic ultrasound for evaluation.   Electronically Signed by Glennie Hawk Specimen Collected: - Last Resulted: - Date: 03/15/15  Received From: [place]   She reports that she has never smoked. She has never used smokeless tobacco. No results for input(s): HGBA1C, LABURIC in the last 8760 hours.  Objective:  VS:  HT:    WT:   BMI:     BP:(!) 154/73  HR:64bpm  TEMP: ( )  RESP:  Physical  Exam Constitutional:      Appearance: Normal appearance.  HENT:     Head: Normocephalic and atraumatic.     Right Ear: Tympanic membrane normal.     Left Ear: Tympanic membrane normal.     Nose: Nose normal.     Mouth/Throat:     Mouth: Mucous membranes are moist.  Eyes:     Pupils: Pupils are equal, round, and reactive to light.  Cardiovascular:     Rate and Rhythm: Normal rate.  Pulmonary:     Effort: Pulmonary effort is normal.  Abdominal:     General: There is no distension.  Musculoskeletal:     Cervical back: Normal range of motion and neck supple.     Comments: Pt is slow to rise from seated to standing position. Strong distal strength without clonus, no pain upon palpation of greater trochanters. Sensation intact bilaterally. Walks independently, gait slow.  5/5 strength with hip flexion, abduction/adduction, and knee flexion/extension, ankle dorsi/plantarflexion.   Skin:    General: Skin is warm and dry.     Capillary Refill: Capillary refill takes less than 2 seconds.  Neurological:     General: No focal deficit present.  Mental Status: She is alert and oriented to person, place, and time.  Psychiatric:        Mood and Affect: Mood normal.    Ortho Exam  Imaging: No results found.  Past Medical/Family/Surgical/Social History: Medications & Allergies reviewed per EMR, new medications updated. Patient Active Problem List   Diagnosis Date Noted   Hearing impaired 05/03/2019   Osteoarthritis of both knees 08/26/2016   Osteoarthritis, hand 08/26/2016   Peripheral edema 05/22/2014   Meningioma ([place]) 04/29/2012   Vestibular schwannoma ([place]) 03/26/2012   Vitamin D deficiency 09/24/2009   Vitamin B12 deficiency 07/05/2009   OSTEOPENIA 10/16/2008   Essential hypertension, benign 09/05/2008   MELANOMA, HX OF 09/05/2008   ISCHEMIC COLITIS, HX OF 05/26/2007   Unspecified glaucoma 04/21/2006   FEMALE STRESS INCONTINENCE 04/21/2006   DEGENERATIVE [place] DISEASE,  LUMBAR SPINE 04/21/2006   DIVERTICULOSIS, COLON W/HEM 12/01/2005   Past Medical History:  Diagnosis Date   Arthritis    Phreesia 07/02/2020   Brain mass    monitor by Dr Trenton Gammon - right posterior fossa meningioma - thinks it's benign per patient   Cancer ([place])    hand, face - spots removed   Constipation    Degeneration of lumbar or lumbosacral intervertebral disc    arthritis in back, hips and hands - otc med prn   Diarrhea    Diverticulosis of colon with hemorrhage    Dysrhythmia    Hx irregular heart beat - 15 yrs ago   Female stress incontinence    Glaucoma    Phreesia 07/02/2020   Headache    otc med prn   Hypertension    Lumbago    Missed abortion    x 2 - no surgery required   Paroxysmal supraventricular tachycardia (HCC)    Hx   PONV (postoperative nausea and vomiting)    Pure hypercholesterolemia    SVD (spontaneous vaginal delivery)    x 4   Unspecified glaucoma(365.9)    bilateral   Family History  Problem Relation Age of Onset   Aneurysm Mother    Alzheimer's disease Mother    Cancer Father        prostate   Cancer Sister    Arthritis Sister    Heart disease Brother    Hypertension Brother    Glaucoma Brother    Past Surgical History:  Procedure Laterality Date   ABDOMINAL HYSTERECTOMY     APPENDECTOMY     COLONOSCOPY     EYE SURGERY     bilateral cataract eye surgery   EYE SURGERY     laser to relieve eye pressure - bilateral   LAPAROTOMY N/A 02/06/2014   Procedure: EXPLORATORY LAPAROTOMY;  Surgeon: Lavonia Drafts, MD;  Location: [facility];  Service: Gynecology;  Laterality: N/A;   MULTIPLE TOOTH EXTRACTIONS     upper   right hand surgery     ganglion cyst removed   TUBAL LIGATION     Social History   Occupational History   Occupation: raised tobacco    Employer: Retired  Tobacco Use   Smoking status: Never   Smokeless tobacco: Never  Substance and Sexual Activity   Alcohol use: No   Drug use: No   Sexual activity: Not  Currently    Birth control/protection: None, Post-menopausal, Surgical

## 2020-09-12 ENCOUNTER — Ambulatory Visit: Payer: Self-pay

## 2020-09-12 ENCOUNTER — Encounter: Payer: Self-pay | Admitting: Physical Medicine and Rehabilitation

## 2020-09-12 ENCOUNTER — Ambulatory Visit (INDEPENDENT_AMBULATORY_CARE_PROVIDER_SITE_OTHER): Payer: PPO | Admitting: Physical Medicine and Rehabilitation

## 2020-09-12 VITALS — BP 172/85 | HR 73

## 2020-09-12 DIAGNOSIS — M5416 Radiculopathy, lumbar region: Secondary | ICD-10-CM | POA: Diagnosis not present

## 2020-09-12 MED ORDER — BETAMETHASONE SOD PHOS & ACET 6 (3-3) MG/ML IJ SUSP
12.0000 mg | Freq: Once | INTRAMUSCULAR | Status: AC
  Administered 2020-09-12: 12 mg

## 2020-09-12 NOTE — Patient Instructions (Signed)

## 2020-09-12 NOTE — Progress Notes (Signed)
Pt state lower back pain that travels to her buttock and down her left leg. Pt state sitting, when she first get up and take her step she feels pain. Pt state she take pain meds to help ease her pain.  Numeric Pain Rating Scale and Functional Assessment Average Pain 0   In the last MONTH (on 0-10 scale) has pain interfered with the following?  1. General activity like being  able to carry out your everyday physical activities such as walking, climbing stairs, carrying groceries, or moving a chair?  Rating(0)   +Driver, -BT, -Dye Allergies.

## 2020-09-16 NOTE — Procedures (Signed)
Lumbar Epidural Steroid Injection - Interlaminar Approach with Fluoroscopic Guidance  Patient: Debra [name]      Date of Birth: 1934/09/15 MRN: [id number] PCP: Susy Frizzle, MD      Visit Date: 09/12/2020   Universal Protocol:     Consent Given By: the patient  Position: PRONE  Additional Comments: Vital signs were monitored before and after the procedure. Patient was prepped and draped in the usual sterile fashion. The correct patient, procedure, and site was verified.   Injection Procedure Details:   Procedure diagnoses: Lumbar radiculopathy [M54.16]   Meds Administered:  Meds ordered this encounter  Medications   betamethasone acetate-betamethasone sodium phosphate (CELESTONE) injection 12 mg     Laterality: Left  Location/Site:  L5-S1  Needle: 3.5 in., 20 ga. Tuohy  Needle Placement: Paramedian epidural  Findings:   -Comments: Excellent flow of contrast into the epidural space.  Procedure Details: Using a paramedian approach from the side mentioned above, the region overlying the inferior lamina was localized under fluoroscopic visualization and the soft tissues overlying this structure were infiltrated with 4 ml. of 1% Lidocaine without Epinephrine. The Tuohy needle was inserted into the epidural space using a paramedian approach.   The epidural space was localized using loss of resistance along with counter oblique bi-planar fluoroscopic views.  After negative aspirate for air, blood, and CSF, a 2 ml. volume of Isovue-250 was injected into the epidural space and the flow of contrast was observed. Radiographs were obtained for documentation purposes.    The injectate was administered into the level noted above.   Additional Comments:  The patient tolerated the procedure well Dressing: 2 x 2 sterile gauze and Band-Aid    Post-procedure details: Patient was observed during the procedure. Post-procedure instructions were reviewed.  Patient left the  clinic in stable condition.

## 2020-09-16 NOTE — Progress Notes (Signed)
Debra [name] - 86 y.o. female MRN [id number]  Date of birth: [birth year]  Office Visit Note: Visit Date: 09/12/2020 PCP: Susy Frizzle, MD Referred by: Susy Frizzle, MD  Subjective: Chief Complaint  Patient presents with   Lower Back - Pain   Left Leg - Pain   Left Knee - Pain   Left Ankle - Pain   HPI:  Debra [name] is a 86 y.o. female who comes in today For planned Left  L5-S1 interlaminar epidural steroid injection.  Please see our prior office notes for further details and justification.  ROS Otherwise per HPI.  Assessment & Plan: Visit Diagnoses:    ICD-10-CM   1. Lumbar radiculopathy  M54.16 XR C-ARM NO REPORT    Epidural Steroid injection    betamethasone acetate-betamethasone sodium phosphate (CELESTONE) injection 12 mg      Plan: No additional findings.   Meds & Orders:  Meds ordered this encounter  Medications   betamethasone acetate-betamethasone sodium phosphate (CELESTONE) injection 12 mg    Orders Placed This Encounter  Procedures   XR C-ARM NO REPORT   Epidural Steroid injection    Follow-up: Return if symptoms worsen or fail to improve.   Procedures: No procedures performed  Lumbar Epidural Steroid Injection - Interlaminar Approach with Fluoroscopic Guidance  Patient: Debra [name]      Date of Birth: 25-Feb-1935 MRN: [id number] PCP: Susy Frizzle, MD      Visit Date: 09/12/2020   Universal Protocol:     Consent Given By: the patient  Position: PRONE  Additional Comments: Vital signs were monitored before and after the procedure. Patient was prepped and draped in the usual sterile fashion. The correct patient, procedure, and site was verified.   Injection Procedure Details:   Procedure diagnoses: Lumbar radiculopathy [M54.16]   Meds Administered:  Meds ordered this encounter  Medications   betamethasone acetate-betamethasone sodium phosphate (CELESTONE) injection 12 mg     Laterality:  Left  Location/Site:  L5-S1  Needle: 3.5 in., 20 ga. Tuohy  Needle Placement: Paramedian epidural  Findings:   -Comments: Excellent flow of contrast into the epidural space.  Procedure Details: Using a paramedian approach from the side mentioned above, the region overlying the inferior lamina was localized under fluoroscopic visualization and the soft tissues overlying this structure were infiltrated with 4 ml. of 1% Lidocaine without Epinephrine. The Tuohy needle was inserted into the epidural space using a paramedian approach.   The epidural space was localized using loss of resistance along with counter oblique bi-planar fluoroscopic views.  After negative aspirate for air, blood, and CSF, a 2 ml. volume of Isovue-250 was injected into the epidural space and the flow of contrast was observed. Radiographs were obtained for documentation purposes.    The injectate was administered into the level noted above.   Additional Comments:  The patient tolerated the procedure well Dressing: 2 x 2 sterile gauze and Band-Aid    Post-procedure details: Patient was observed during the procedure. Post-procedure instructions were reviewed.  Patient left the clinic in stable condition.   Clinical History: MRI lumbar spine:   INDICATION: Low back pain, evaluate for herniated disc.   TECHNIQUE: Sagittal and axial T1 and T2-weighted sequences were performed. Additional sagittal STIR images were performed.   COMPARISON: None available   FINDINGS:   Last well-formed disc space designated L5-S1 for the purposes of this report.   #  Vertebral bodies: No compression fracture.  #  Alignment: Grade 2  anterolisthesis of L4 on L5 with complete loss of disc space height at this level. Trace retrolisthesis of T12 on L1, L1 on L2, and L2 on L3.  #  Marrow signal: Degenerative endplate change on the right at L4-L5 with some bony edema.  #  Conus medullaris: Normal. Terminates at L1 with no acute  abnormality.  #  Lower thoracic segments: No significant abnormality.  #  There is a 5.3 cm cystic lesion in the left adnexal region, not fully evaluated.   #  T12-L1: Slight retrolisthesis. Small disc bulge. No significant stenosis.  #  L1-2: Slight retrolisthesis. Small disc bulge. No significant stenosis.  #  L2-3: Slight retrolisthesis. Small diffuse disc bulge. Facet degenerative change. Mild left foraminal stenosis. No significant central canal or right foraminal stenosis.  #  L3-4: Small diffuse disc bulge. Facet degenerative change. No significant stenosis.  #  L4-5: Grade 2 anterolisthesis. Severe facet degenerative change. No evidence of pars interarticularis defect. There is near-complete loss of disc space height. Probable small amount of calcified disc material herniated anteriorly into the prevertebral   soft tissues. Mild central canal stenosis with effacement of the right lateral recess. Moderate right foraminal stenosis. Mild left foraminal stenosis.  #  L5-S1: Facet degenerative change. No significant stenosis.    IMPRESSION:   1. Grade 2 anterolisthesis of L4 on L5, likely related to the severe facet arthropathy. There is mild central canal stenosis with effacement of the right lateral recess and moderate right foraminal stenosis at this level. Correlate with radicular  symptoms.  2. A 5.3 cm cystic lesion within the left adnexal region, not fully evaluated, recommend dedicated pelvic ultrasound for evaluation.   Electronically Signed by Glennie Hawk Specimen Collected: - Last Resulted: - Date: 03/15/15  Received From: [facility]     Objective:  VS:  HT:    WT:   BMI:     BP:(!) 172/85  HR:73bpm  TEMP: ( )  RESP:  Physical Exam Vitals and nursing note reviewed.  Constitutional:      General: She is not in acute distress.    Appearance: Normal appearance. She is not ill-appearing.  HENT:     Head: Normocephalic and atraumatic.     Right Ear: External  ear normal.     Left Ear: External ear normal.  Eyes:     Extraocular Movements: Extraocular movements intact.  Cardiovascular:     Rate and Rhythm: Normal rate.     Pulses: Normal pulses.  Pulmonary:     Effort: Pulmonary effort is normal. No respiratory distress.  Abdominal:     General: There is no distension.     Palpations: Abdomen is soft.  Musculoskeletal:        General: Tenderness present.     Cervical back: Neck supple.     Right lower leg: No edema.     Left lower leg: No edema.     Comments: Patient has good distal strength with no pain over the greater trochanters.  No clonus or focal weakness.  Skin:    Findings: No erythema, lesion or rash.  Neurological:     General: No focal deficit present.     Mental Status: She is alert and oriented to person, place, and time.     Sensory: No sensory deficit.     Motor: No weakness or abnormal muscle tone.     Coordination: Coordination normal.  Psychiatric:        Mood and Affect: Mood normal.  Behavior: Behavior normal.     Imaging: No results found.

## 2020-10-04 ENCOUNTER — Telehealth: Payer: Self-pay | Admitting: Pharmacist

## 2020-10-04 NOTE — Progress Notes (Addendum)
    Chronic Care Management Pharmacy Assistant   Name: Debra [name]  MRN: [id number] DOB: 1934-11-11  Reason for Encounter: General Disease State Call   Conditions to be addressed/monitored: HTN, Osteopenia, Arthritis, Edema  Recent office visits:  None since 08/15/20.  Recent consult visits:  09/11/20 Phys med For lower back. No medication changes.   Hospital visits:  None since 08/15/20.   Medications: Outpatient Encounter Medications as of 10/04/2020  Medication Sig Note   acetaminophen (TYLENOL) 325 MG tablet Take 650 mg by mouth every 6 (six) hours as needed.    amLODipine (NORVASC) 10 MG tablet TAKE 1 TABLET BY MOUTH ONCE DAILY    atenolol (TENORMIN) 50 MG tablet TAKE 1 TABLET BY MOUTH ONCE DAILY    brimonidine (ALPHAGAN) 0.2 % ophthalmic solution Place 1 drop into both eyes 2 (two) times daily.    cholecalciferol (VITAMIN D3) 25 MCG (1000 UNIT) tablet Take 1,000 Units by mouth daily.    cloNIDine (CATAPRES) 0.1 MG tablet TAKE 1 TABLET BY MOUTH IN THE MORNING AND 2 TABLETS AT BEDTIME    furosemide (LASIX) 40 MG tablet TAKE 1 TABLET BY MOUTH ONCE DAILY AS NEEDED    latanoprost (XALATAN) 0.005 % ophthalmic solution Place 1 drop into both eyes at bedtime.    lisinopril (ZESTRIL) 40 MG tablet TAKE 1 TABLET BY MOUTH ONCE A DAY 07/04/2020: She is taking one-half tablet twice daily   pravastatin (PRAVACHOL) 80 MG tablet Take 1 tablet (80 mg total) by mouth daily.    timolol (TIMOPTIC) 0.5 % ophthalmic solution Place 1 drop into both eyes 2 (two) times daily.    traMADol (ULTRAM) 50 MG tablet Take 1 tablet (50 mg total) by mouth every 6 (six) hours as needed for severe pain.    vitamin B-12 (CYANOCOBALAMIN) 100 MCG tablet Take 100 mcg by mouth daily.    No facility-administered encounter medications on file as of 10/04/2020.   GEN CALL: Patients daughter stated she is doing very well. She stated her mother is still living on her own. She stated she is still able to cook and clean  around the house. The patients daughter stated her mom still does have back pain and recently received a injection in her back that helped her some but not much. She stated she also goes out in the yard sometimes when weather permits. She stated her hearing is still not good and is unable to hear much on the phone.   Star Rating Drugs: Lisinopril 40 mg 90 DS 09/06/20, Pravastatin 80 mg 90 DS 09/03/20.  Follow-Up:Pharmacist Review   Charlann Lange, RMA Clinical Pharmacist Assistant [phone]  10 minutes spent in review, coordination, and documentation.  Reviewed by: Beverly Milch, PharmD Clinical Pharmacist [phone]

## 2020-10-15 DIAGNOSIS — H40153 Residual stage of open-angle glaucoma, bilateral: Secondary | ICD-10-CM | POA: Diagnosis not present

## 2020-10-22 ENCOUNTER — Telehealth: Payer: Self-pay | Admitting: Physical Medicine and Rehabilitation

## 2020-10-22 NOTE — Telephone Encounter (Signed)
Patient's daughter states that the patient had relief of back pain following injection, but she now complains of pain in low back, upper back, across shoulders. Scheduled for OV.

## 2020-10-22 NOTE — Telephone Encounter (Signed)
Pt's daughter Debra [name] called to set an appt for pt back pains/multi pains. Please call pt at [phone]

## 2020-10-22 NOTE — Telephone Encounter (Signed)
Patient's daughter Juliann Pulse called back as for a return call     The number to contact patient is [phone]

## 2020-10-22 NOTE — Telephone Encounter (Signed)
See previous message

## 2020-10-22 NOTE — Telephone Encounter (Signed)
Left message #1 to ask about relief from left L5-S1 IL on 7/14 and to find out if the pain is in the same area.

## 2020-10-23 ENCOUNTER — Ambulatory Visit: Payer: PPO | Admitting: Physical Medicine and Rehabilitation

## 2020-10-23 ENCOUNTER — Encounter: Payer: Self-pay | Admitting: Physical Medicine and Rehabilitation

## 2020-10-23 ENCOUNTER — Other Ambulatory Visit: Payer: Self-pay

## 2020-10-23 VITALS — BP 151/83 | HR 71

## 2020-10-23 DIAGNOSIS — G8929 Other chronic pain: Secondary | ICD-10-CM

## 2020-10-23 DIAGNOSIS — M542 Cervicalgia: Secondary | ICD-10-CM

## 2020-10-23 DIAGNOSIS — M5442 Lumbago with sciatica, left side: Secondary | ICD-10-CM

## 2020-10-23 DIAGNOSIS — G894 Chronic pain syndrome: Secondary | ICD-10-CM | POA: Diagnosis not present

## 2020-10-23 DIAGNOSIS — M4316 Spondylolisthesis, lumbar region: Secondary | ICD-10-CM | POA: Diagnosis not present

## 2020-10-23 DIAGNOSIS — M47816 Spondylosis without myelopathy or radiculopathy, lumbar region: Secondary | ICD-10-CM

## 2020-10-23 DIAGNOSIS — M5416 Radiculopathy, lumbar region: Secondary | ICD-10-CM

## 2020-10-23 MED ORDER — PREDNISONE 20 MG PO TABS: 20.0000 mg | ORAL_TABLET | Freq: Every day | ORAL | 0 refills | Status: DC

## 2020-10-23 NOTE — Progress Notes (Signed)
Pt state neck pain that travels to both shoulders. Pt state lower back that travels to both hips and down both legs. Pt state she cant bending over and drop things al the time. Pt state walking, standing and sitting makes the pain worse. Pt state it hard to get out of bed. Pt state takes over the counter pain meds to help ease her pain.  Numeric Pain Rating Scale and Functional Assessment Average Pain 10 Pain Right Now 8 My pain is constant, sharp, burning, and aching Pain is worse with: walking, bending, sitting, standing, and some activites Pain improves with: heat/ice and medication   In the last MONTH (on 0-10 scale) has pain interfered with the following?  1. General activity like being  able to carry out your everyday physical activities such as walking, climbing stairs, carrying groceries, or moving a chair?  Rating(8)  2. Relation with others like being able to carry out your usual social activities and roles such as  activities at home, at work and in your community. Rating(9)  3. Enjoyment of life such that you have  been bothered by emotional problems such as feeling anxious, depressed or irritable?  Rating(10)

## 2020-10-23 NOTE — Progress Notes (Signed)
Debra [name] - 86 y.o. female MRN [id number]  Date of birth: September 27, 1934  Office Visit Note: Visit Date: 10/23/2020 PCP: Susy Frizzle, MD Referred by: Susy Frizzle, MD  Subjective: Chief Complaint  Patient presents with   Neck - Pain   Right Shoulder - Pain   Left Shoulder - Pain   Lower Back - Pain   Left Leg - Pain   Right Leg - Pain   Left Hip - Pain   Right Hip - Pain   HPI: Debra [name] is a 86 y.o. female who comes in today for evaluation of chronic, worsening and severe bilateral lower back pain radiating to buttocks and hips.  Patient reports pain is exacerbated by walking, bending and lifting.  Patient reports some relief of pain with rest and use of Tylenol.  Patient describes pain as soreness and stiffness sensation, currently rates as 8 out of 10.  Patient's lumbar MRI from 2017 exhibits anterolisthesis of L4 on L5 likely related to severe facet arthropathy, mild central canal stenosis, right lateral recess and right foraminal narrowing also noted at this level.  Patient have left L5-S1 interlaminar epidural steroid injection performed in July.  Patient states she did feel like this helped alleviate her pain for approximately 2 weeks.  Patient states that she is now having difficulty performing daily tasks and also feels that she having more difficulty ambulating due to pain.    Patient also reports bilateral neck pain radiating to bilateral shoulders and elbows.  Patient states this pain is new and started within the last month.  Patient reports pain is exacerbated by activity and is somewhat relieved with rest.  Patient describes pain as soreness and tightness sensation, currently rates as 8 out of 10.  Patient states pain is making it difficult for her to sleep at night.  Patient reports issues with neck pain many years ago but to her knowledge she has never received treatments or had imaging of her neck.  Patient denies focal weakness, numbness and  tingling.  Patient denies any recent trauma or falls.  Patient denies any bowel or bladder incontinence.  Review of Systems  Musculoskeletal:  Positive for back pain, myalgias and neck pain.  Neurological:  Negative for tingling, sensory change, focal weakness and weakness.  All other systems reviewed and are negative. Otherwise per HPI.  Assessment & Plan: Visit Diagnoses:    ICD-10-CM   1. Lumbar radiculopathy  M54.16 MR LUMBAR SPINE WO CONTRAST    Ambulatory referral to Physical Therapy    2. Chronic left-sided low back pain with left-sided sciatica  M54.42 MR LUMBAR SPINE WO CONTRAST   G89.29 Ambulatory referral to Physical Therapy    3. Anterolisthesis of lumbar spine  M43.16 MR LUMBAR SPINE WO CONTRAST    Ambulatory referral to Physical Therapy    4. Facet arthropathy, lumbar  M47.816     5. Cervicalgia  M54.2     6. Chronic pain syndrome  G89.4        Plan: Findings:  Chronic, worsening and severe bilateral lower back pain rating to buttocks and hips.  Patient continues to have severe pain despite good conservative therapies such as rest and use of Tylenol.  We feel the next step is to refer the patient for in-house physical therapy and to also obtain a new lumbar MRI.  We also wrote a prescription today for a short course of prednisone to see if this will help alleviate some of her pain.  Patient instructed to take 50 mg of prednisone by mouth at breakfast for 5 days.  Worsening bilateral neck pain radiating to bilateral shoulders and elbows for the last month.  Patient's clinical presentation and exam seem to be more consistent with myofascial pain.  We also considered polymyalgia rheumatica as a differential diagnoses.  We also feel that patient would benefit from physical therapy for her neck pain.  If patient continues to have severe bilateral neck pain radiating to shoulders and elbows after physical therapy and short course of prednisone, we will then consider obtaining  images of her cervical spine. Patient encouraged to continue good conservative therapies at home to better manage lower back and neck pain.  Patient also instructed to take Tylenol 500 mg by mouth 4 times a day. No red flag symptoms noted today upon exam.   Meds & Orders:  Meds ordered this encounter  Medications   predniSONE (DELTASONE) 20 MG tablet    Sig: Take 1 tablet (20 mg total) by mouth daily with breakfast. Take until completed.    Dispense:  5 tablet    Refill:  0    Order Specific Question:   Supervising Provider    Answer:   Magnus Sinning [id number]    Orders Placed This Encounter  Procedures   MR LUMBAR SPINE WO CONTRAST   Ambulatory referral to Physical Therapy    Follow-up: Return in about 1 month (around 11/23/2020) for follow-up after physical therapy and lumbar MRI is obtained.   Procedures: No procedures performed      Clinical History: MRI lumbar spine:   INDICATION: Low back pain, evaluate for herniated disc.   TECHNIQUE: Sagittal and axial T1 and T2-weighted sequences were performed. Additional sagittal STIR images were performed.   COMPARISON: None available   FINDINGS:   Last well-formed disc space designated L5-S1 for the purposes of this report.   #  Vertebral bodies: No compression fracture.  #  Alignment: Grade 2 anterolisthesis of L4 on L5 with complete loss of disc space height at this level. Trace retrolisthesis of T12 on L1, L1 on L2, and L2 on L3.  #  Marrow signal: Degenerative endplate change on the right at L4-L5 with some bony edema.  #  Conus medullaris: Normal. Terminates at L1 with no acute abnormality.  #  Lower thoracic segments: No significant abnormality.  #  There is a 5.3 cm cystic lesion in the left adnexal region, not fully evaluated.   #  T12-L1: Slight retrolisthesis. Small disc bulge. No significant stenosis.  #  L1-2: Slight retrolisthesis. Small disc bulge. No significant stenosis.  #  L2-3: Slight retrolisthesis. Small  diffuse disc bulge. Facet degenerative change. Mild left foraminal stenosis. No significant central canal or right foraminal stenosis.  #  L3-4: Small diffuse disc bulge. Facet degenerative change. No significant stenosis.  #  L4-5: Grade 2 anterolisthesis. Severe facet degenerative change. No evidence of pars interarticularis defect. There is near-complete loss of disc space height. Probable small amount of calcified disc material herniated anteriorly into the prevertebral   soft tissues. Mild central canal stenosis with effacement of the right lateral recess. Moderate right foraminal stenosis. Mild left foraminal stenosis.  #  L5-S1: Facet degenerative change. No significant stenosis.    IMPRESSION:   1. Grade 2 anterolisthesis of L4 on L5, likely related to the severe facet arthropathy. There is mild central canal stenosis with effacement of the right lateral recess and moderate right foraminal stenosis at this level. Correlate  with radicular  symptoms.  2. A 5.3 cm cystic lesion within the left adnexal region, not fully evaluated, recommend dedicated pelvic ultrasound for evaluation.   Electronically Signed by Glennie Hawk Specimen Collected: - Last Resulted: - Date: 03/15/15  Received From: [facility]   She reports that she has never smoked. She has never used smokeless tobacco. No results for input(s): HGBA1C, LABURIC in the last 8760 hours.  Objective:  VS:  HT:    WT:   BMI:     BP:(!) 151/83  HR:71bpm  TEMP: ( )  RESP:  Physical Exam HENT:     Head: Normocephalic and atraumatic.     Right Ear: Tympanic membrane normal.     Left Ear: Tympanic membrane normal.     Nose: Nose normal.     Mouth/Throat:     Mouth: Mucous membranes are moist.  Eyes:     Pupils: Pupils are equal, round, and reactive to light.  Cardiovascular:     Rate and Rhythm: Normal rate.     Pulses: Normal pulses.  Pulmonary:     Effort: Pulmonary effort is normal.  Abdominal:     General:  Abdomen is flat. There is no distension.  Musculoskeletal:     Cervical back: Tenderness present.     Comments: Pt is slow to rise from seated position to standing. Pain noted upon facet loading. Strong distal strength without clonus, no pain upon palpation of greater trochanters. Sensation intact bilaterally. Walks independently, gait slow.   Discomfort noted with flexion, extension and side-to-side rotation. Good strength noted to bilateral upper extremities. Sensation intact bilaterally. Negative Hoffman's sign.   Limited range of motion noted to bilateral shoulders. No signs of impingement noted.   Skin:    General: Skin is warm and dry.     Capillary Refill: Capillary refill takes less than 2 seconds.  Neurological:     General: No focal deficit present.     Mental Status: She is alert.  Psychiatric:        Mood and Affect: Mood normal.    Ortho Exam  Imaging: No results found.  Past Medical/Family/Surgical/Social History: Medications & Allergies reviewed per EMR, new medications updated. Patient Active Problem List   Diagnosis Date Noted   Hearing impaired 05/03/2019   Osteoarthritis of both knees 08/26/2016   Osteoarthritis, hand 08/26/2016   Peripheral edema 05/22/2014   Meningioma ([place]) 04/29/2012   Vestibular schwannoma ([place]) 03/26/2012   Vitamin D deficiency 09/24/2009   Vitamin B12 deficiency 07/05/2009   OSTEOPENIA 10/16/2008   Essential hypertension, benign 09/05/2008   MELANOMA, HX OF 09/05/2008   ISCHEMIC COLITIS, HX OF 05/26/2007   Unspecified glaucoma 04/21/2006   FEMALE STRESS INCONTINENCE 04/21/2006   DEGENERATIVE [place] DISEASE, LUMBAR SPINE 04/21/2006   DIVERTICULOSIS, COLON W/HEM 12/01/2005   Past Medical History:  Diagnosis Date   Arthritis    [employer]rees[employer] 07/02/2020   Brain mass    monitor by Dr Trenton Gammon - right posterior fossa meningioma - thinks it's benign per patient   Cancer ([place])    hand, face - spots removed   Constipation    Degeneration of  lumbar or lumbosacral intervertebral disc    arthritis in back, hips and hands - otc med prn   Diarrhea    Diverticulosis of colon with hemorrhage    Dysrhythmia    Hx irregular heart beat - 15 yrs ago   Female stress incontinence    Glaucoma    [employer]reesia 07/02/2020   Headache  otc med prn   Hypertension    Lumbago    Missed abortion    x 2 - no surgery required   Paroxysmal supraventricular tachycardia (HCC)    Hx   PONV (postoperative nausea and vomiting)    Pure hypercholesterolemia    SVD (spontaneous vaginal delivery)    x 4   Unspecified glaucoma(365.9)    bilateral   Family History  Problem Relation Age of Onset   Aneurysm Mother    Alzheimer's disease Mother    Cancer Father        prostate   Cancer Sister    Arthritis Sister    Heart disease Brother    Hypertension Brother    Glaucoma Brother    Past Surgical History:  Procedure Laterality Date   ABDOMINAL HYSTERECTOMY     APPENDECTOMY     COLONOSCOPY     EYE SURGERY     bilateral cataract eye surgery   EYE SURGERY     laser to relieve eye pressure - bilateral   LAPAROTOMY N/A 02/06/2014   Procedure: EXPLORATORY LAPAROTOMY;  Surgeon: Lavonia Drafts, MD;  Location: [facility];  Service: Gynecology;  Laterality: N/A;   MULTIPLE TOOTH EXTRACTIONS     upper   right hand surgery     ganglion cyst removed   TUBAL LIGATION     Social History   Occupational History   Occupation: raised tobacco    Employer: Retired  Tobacco Use   Smoking status: Never   Smokeless tobacco: Never  Substance and Sexual Activity   Alcohol use: No   Drug use: No   Sexual activity: Not Currently    Birth control/protection: None, Post-menopausal, Surgical

## 2020-10-31 ENCOUNTER — Telehealth: Payer: Self-pay | Admitting: Physical Medicine and Rehabilitation

## 2020-10-31 NOTE — Telephone Encounter (Signed)
MRI and PT ordered at last OV. MRI has not been scheduled; first PT appointment is 9/12. Please advise.

## 2020-10-31 NOTE — Telephone Encounter (Signed)
Called patient's daughter and gave her the number to [facility]. She wanted an office visit as soon as possible. Scheduled for Friday, 9/2, with Megan.

## 2020-10-31 NOTE — Telephone Encounter (Signed)
Pt's daughter Juliann Pulse called requesting an appt for pains in lower area past back. Please call at [phone].

## 2020-11-01 ENCOUNTER — Other Ambulatory Visit: Payer: Self-pay

## 2020-11-01 ENCOUNTER — Encounter: Payer: Self-pay | Admitting: Physical Medicine and Rehabilitation

## 2020-11-01 ENCOUNTER — Ambulatory Visit: Payer: PPO | Admitting: Physical Medicine and Rehabilitation

## 2020-11-01 VITALS — BP 116/75 | HR 63

## 2020-11-01 DIAGNOSIS — M545 Low back pain, unspecified: Secondary | ICD-10-CM

## 2020-11-01 DIAGNOSIS — M47816 Spondylosis without myelopathy or radiculopathy, lumbar region: Secondary | ICD-10-CM | POA: Diagnosis not present

## 2020-11-01 DIAGNOSIS — M542 Cervicalgia: Secondary | ICD-10-CM

## 2020-11-01 DIAGNOSIS — M4316 Spondylolisthesis, lumbar region: Secondary | ICD-10-CM

## 2020-11-01 DIAGNOSIS — M79606 Pain in leg, unspecified: Secondary | ICD-10-CM | POA: Diagnosis not present

## 2020-11-01 DIAGNOSIS — G894 Chronic pain syndrome: Secondary | ICD-10-CM | POA: Diagnosis not present

## 2020-11-01 DIAGNOSIS — M353 Polymyalgia rheumatica: Secondary | ICD-10-CM | POA: Diagnosis not present

## 2020-11-01 MED ORDER — PREDNISONE 10 MG PO TABS: 10.0000 mg | ORAL_TABLET | Freq: Every day | ORAL | 0 refills | Status: DC

## 2020-11-01 NOTE — Progress Notes (Signed)
Pt state neck pain that travels to both shoulder. Pt state lower back pain that travels to both hips down both legs and feet. Pt state sitting and trying to get up from a chair makes the pain worse. Pt state walking, standing and laying down makes the pain worse. Pt state she drops a lot of items. Pt state she takes pain meds to help ease her pain. Pt state her prednisone has helped the pass couple of days.  Numeric Pain Rating Scale and Functional Assessment Average Pain 8 Pain Right Now 7 My pain is constant, burning, tingling, and aching Pain is worse with: walking, bending, sitting, standing, and some activites Pain improves with: medication   In the last MONTH (on 0-10 scale) has pain interfered with the following?  1. General activity like being  able to carry out your everyday physical activities such as walking, climbing stairs, carrying groceries, or moving a chair?  Rating(7)  2. Relation with others like being able to carry out your usual social activities and roles such as  activities at home, at work and in your community. Rating(8)  3. Enjoyment of life such that you have  been bothered by emotional problems such as feeling anxious, depressed or irritable?  Rating(9)

## 2020-11-01 NOTE — Progress Notes (Signed)
Debra [name] - 86 y.o. female MRN [id number]  Date of birth: 05-04-1934  Office Visit Note: Visit Date: 11/01/2020 PCP: Susy Frizzle, MD Referred by: Susy Frizzle, MD  Subjective: Chief Complaint  Patient presents with   Lower Back - Pain   Right Hip - Pain   Left Hip - Pain   Neck - Pain   Right Shoulder - Pain   Left Shoulder - Pain   HPI: Debra [name] is a 86 y.o. female who comes in today For evaluation of 2 ongoing issues. Chronic, worsening and severe bilateral lower back pain radiating to hips and buttocks. Patient reports pain is exacerbated by walking, bending and lifting. Patient describes pain as soreness and currently rates as 7 out of 10. Patient reports some relief with rest and use of Tylenol. Patient's lumbar MRI from 2017 exhibits anterolisthesis of L4 on L5, mild central canal stenosis, right lateral recess and right foraminal narrowing also noted at this level. No high grade stenosis noted. Patient had left L5-S1 interlaminar epidural steroid injection in July, which she states provided her with substantial pain relief for approximately 2 weeks.   Patient is also being evaluated for chronic, worsening and severe bilateral neck pain radiating to shoulders. Patient reports pain has been ongoing for several months and is beginning to worsen. Patient reports pain is exacerbated by movement and activity. She describes this pain as a tightness sensation, currently rates as 6 out of 10. Patient reports pain is somewhat relieved with rest.   Patient was recently placed on short course of oral Prednisone and reports that seemed to help her more than any other treatments we have tried. Patient states she was completely pain free for several days and could resume her daily activities without difficulty. Patient is scheduled to have lumbar MRI tomorrow 9/3 and will begin physical therapy sessions on 9/12.   Patient denies focal weakness, numbness and tingling.  Patient denies recent trauma or falls.   Review of Systems  HENT:  Positive for hearing loss.   Musculoskeletal:  Positive for back pain, joint pain, myalgias and neck pain.  Neurological:  Negative for tingling, sensory change, focal weakness and weakness.       Pt reports she has chronic issues with gait instability.  All other systems reviewed and are negative. Otherwise per HPI.  Assessment & Plan: Visit Diagnoses:    ICD-10-CM   1. Low back pain radiating to lower extremity  M54.50 CBC with Differential   M79.606 Sedimentation rate    C-reactive protein    HLA-B27 antigen    Rheumatoid Factor    Antinuclear Antib (ANA)    2. Anterolisthesis of lumbar spine  M43.16     3. Facet arthropathy, lumbar  M47.816     4. Cervicalgia  M54.2     5. Chronic pain syndrome  G89.4     6. Polymyalgia rheumatica (HCC)  M35.3        Plan: Findings:  Chronic, worsening and severe bilateral lower back pain radiating to hips and buttocks. No radicular symptoms noted today. Patient instructed to continue with conservative therapies, ensure that she has lumbar MRI performed tomorrow 9/3 and start physical therapy as scheduled on 9/12. Depending on results from lumbar MRI we would consider performing epidural steroid injection.   Chronic, worsening and severe bilateral neck pain radiating to bilateral shoulders. Patient's clinical presentation and exam are consistent with myofascial pain vs polymyalgia rheumatica.  Today, we have presumptively diagnosed  her with polymyalgia rheumatica due to significant pain relief with short course of oral steroids. Patient given prescription today for low dose of Prednisone, 10 mg daily to take for 1 month. We also ordered blood tests in office today, depending on results of blood tests and oral Prednisone we would consider referring patient to Dr. Vernelle Emerald with rheumatology. Patient instructed to follow-up with physical therapy as scheduled. If she gets good  relief from physical therapy we will continue to monitor, however if she continues to have severe pain we will consider ordering imaging of her cervical spine.   We will follow-up with patient in several weeks after lumbar MRI is obtained and she has started physical therapy and Prednisone. No red flag symptoms noted upon exam today.      Meds & Orders:  Meds ordered this encounter  Medications   predniSONE (DELTASONE) 10 MG tablet    Sig: Take 1 tablet (10 mg total) by mouth daily with breakfast.    Dispense:  30 tablet    Refill:  0    Orders Placed This Encounter  Procedures   CBC with Differential   Sedimentation rate   C-reactive protein   HLA-B27 antigen   Rheumatoid Factor   Antinuclear Antib (ANA)    Follow-up: Return in about 2 weeks (around 11/15/2020) for follow-up after lumbar MRI is completed.   Procedures: No procedures performed      Clinical History:   She reports that she has never smoked. She has never used smokeless tobacco. No results for input(s): HGBA1C, LABURIC in the last 8760 hours.  Objective:  VS:  HT:    WT:   BMI:     BP:116/75  HR:63bpm  TEMP: ( )  RESP:  Physical Exam HENT:     Head: Normocephalic and atraumatic.     Right Ear: Tympanic membrane normal.     Left Ear: Tympanic membrane normal.     Nose: Nose normal.     Mouth/Throat:     Mouth: Mucous membranes are moist.  Eyes:     Pupils: Pupils are equal, round, and reactive to light.  Cardiovascular:     Rate and Rhythm: Normal rate.     Pulses: Normal pulses.  Pulmonary:     Effort: Pulmonary effort is normal.  Abdominal:     General: Abdomen is flat. There is no distension.  Musculoskeletal:     Cervical back: Tenderness present.     Comments: Pt is slow to rise from seated position to standing. Good lumbar range of motion. Strong distal strength without clonus, no pain upon palpation of greater trochanters. No pain noted with bilateral hip flexion/extension. Sensation  intact bilaterally. Walks independently, gait is slow and unsteady.   Discomfort noted with flexion, extension and side-to-side rotation. Good strength noted to bilateral upper extremities. Sensation intact bilaterally. Negative Hoffman's sign.   Limited range of motion noted to bilateral shoulders. No signs of impingement noted.     Skin:    General: Skin is warm and dry.     Capillary Refill: Capillary refill takes less than 2 seconds.  Neurological:     Mental Status: She is alert.     Gait: Gait abnormal.  Psychiatric:        Mood and Affect: Mood normal.    Ortho Exam  Imaging: No results found.  Past Medical/Family/Surgical/Social History: Medications & Allergies reviewed per EMR, new medications updated. Patient Active Problem List   Diagnosis Date Noted   Hearing  impaired 05/03/2019   Osteoarthritis of both knees 08/26/2016   Osteoarthritis, hand 08/26/2016   Peripheral edema 05/22/2014   Meningioma ([place]) 04/29/2012   Vestibular schwannoma ([place]) 03/26/2012   Vitamin D deficiency 09/24/2009   Vitamin B12 deficiency 07/05/2009   OSTEOPENIA 10/16/2008   Essential hypertension, benign 09/05/2008   MELANOMA, HX OF 09/05/2008   ISCHEMIC COLITIS, HX OF 05/26/2007   Unspecified glaucoma 04/21/2006   FEMALE STRESS INCONTINENCE 04/21/2006   DEGENERATIVE [place] DISEASE, LUMBAR SPINE 04/21/2006   DIVERTICULOSIS, COLON W/HEM 12/01/2005   Past Medical History:  Diagnosis Date   Arthritis    Phreesia 07/02/2020   Brain mass    monitor by Dr Trenton Gammon - right posterior fossa meningioma - thinks it's benign per patient   Cancer ([place])    hand, face - spots removed   Constipation    Degeneration of lumbar or lumbosacral intervertebral disc    arthritis in back, hips and hands - otc med prn   Diarrhea    Diverticulosis of colon with hemorrhage    Dysrhythmia    Hx irregular heart beat - 15 yrs ago   Female stress incontinence    Glaucoma    Phreesia 07/02/2020   Headache     otc med prn   Hypertension    Lumbago    Missed abortion    x 2 - no surgery required   Paroxysmal supraventricular tachycardia (HCC)    Hx   PONV (postoperative nausea and vomiting)    Pure hypercholesterolemia    SVD (spontaneous vaginal delivery)    x 4   Unspecified glaucoma(365.9)    bilateral   Family History  Problem Relation Age of Onset   Aneurysm Mother    Alzheimer's disease Mother    Cancer Father        prostate   Cancer Sister    Arthritis Sister    Heart disease Brother    Hypertension Brother    Glaucoma Brother    Past Surgical History:  Procedure Laterality Date   ABDOMINAL HYSTERECTOMY     APPENDECTOMY     COLONOSCOPY     EYE SURGERY     bilateral cataract eye surgery   EYE SURGERY     laser to relieve eye pressure - bilateral   LAPAROTOMY N/A 02/06/2014   Procedure: EXPLORATORY LAPAROTOMY;  Surgeon: Lavonia Drafts, MD;  Location: [facility];  Service: Gynecology;  Laterality: N/A;   MULTIPLE TOOTH EXTRACTIONS     upper   right hand surgery     ganglion cyst removed   TUBAL LIGATION     Social History   Occupational History   Occupation: raised tobacco    Employer: Retired  Tobacco Use   Smoking status: Never   Smokeless tobacco: Never  Substance and Sexual Activity   Alcohol use: No   Drug use: No   Sexual activity: Not Currently    Birth control/protection: None, Post-menopausal, Surgical

## 2020-11-02 ENCOUNTER — Ambulatory Visit
Admission: RE | Admit: 2020-11-02 | Discharge: 2020-11-02 | Disposition: A | Discharge status: Home / Self Care | Payer: PPO | Source: Ambulatory Visit | Attending: Physical Medicine and Rehabilitation | Admitting: Physical Medicine and Rehabilitation

## 2020-11-02 DIAGNOSIS — M48061 Spinal stenosis, lumbar region without neurogenic claudication: Secondary | ICD-10-CM | POA: Diagnosis not present

## 2020-11-02 DIAGNOSIS — M545 Low back pain, unspecified: Secondary | ICD-10-CM | POA: Diagnosis not present

## 2020-11-02 DIAGNOSIS — M4316 Spondylolisthesis, lumbar region: Secondary | ICD-10-CM

## 2020-11-02 DIAGNOSIS — G8929 Other chronic pain: Secondary | ICD-10-CM

## 2020-11-02 DIAGNOSIS — M5416 Radiculopathy, lumbar region: Secondary | ICD-10-CM

## 2020-11-05 ENCOUNTER — Ambulatory Visit (INDEPENDENT_AMBULATORY_CARE_PROVIDER_SITE_OTHER): Payer: PPO | Admitting: Family Medicine

## 2020-11-05 ENCOUNTER — Encounter: Payer: Self-pay | Admitting: Family Medicine

## 2020-11-05 ENCOUNTER — Other Ambulatory Visit: Payer: Self-pay

## 2020-11-05 VITALS — BP 128/66 | HR 66 | Temp 98.9°F | Resp 18 | Ht 63.0 in | Wt 161.0 lb

## 2020-11-05 DIAGNOSIS — I10 Essential (primary) hypertension: Secondary | ICD-10-CM | POA: Diagnosis not present

## 2020-11-05 DIAGNOSIS — E7800 Pure hypercholesterolemia, unspecified: Secondary | ICD-10-CM | POA: Diagnosis not present

## 2020-11-05 DIAGNOSIS — M353 Polymyalgia rheumatica: Secondary | ICD-10-CM | POA: Diagnosis not present

## 2020-11-05 DIAGNOSIS — R609 Edema, unspecified: Secondary | ICD-10-CM | POA: Diagnosis not present

## 2020-11-05 NOTE — Progress Notes (Signed)
Subjective:    Patient ID: Debra [name], female    DOB: 02/23/1935, 86 y.o.   MRN: [id number]  HPI  Patient is a very sweet 86 year old Caucasian female who presents today to establish care with me.  Previously she was seen my partner who has since retired from family medicine.  However over the last several months she has been dealing with progressive pain and weakness.  Initially began in her lower back and in her hips and in her glutes.  She was losing the ability to walk and even bend over.  She states that her leg muscles are becoming extremely weak.  Then she developed severe pain and weakness in her shoulder muscles.  She was seeing Dr. Ernestina Patches who ordered an MRI which showed moderate spinal stenosis and advanced facet disease but no obvious explanation for her symptoms.  He then began treating her for what sounds like PMR with 20 mg of prednisone and the patient responded dramatically.  She is almost 100% better on the 20 mg of prednisone however after discontinuing the prednisone her symptoms came back immediately.  He has resumed 10 mg of prednisone and is doing this for the next month.  The patient states that she is 70% better on 10 mg of prednisone.  However further work-up after that point has not been decided upon.  From her report she may be scheduled to see a rheumatologist. Past Medical History:  Diagnosis Date  . Arthritis    Phreesia 07/02/2020  . Brain mass    monitor by Dr Trenton Gammon - right posterior fossa meningioma - thinks it's benign per patient  . Cancer (HCC)    hand, face - spots removed  . Constipation   . Degeneration of lumbar or lumbosacral intervertebral disc    arthritis in back, hips and hands - otc med prn  . Diarrhea   . Diverticulosis of colon with hemorrhage   . Dysrhythmia    Hx irregular heart beat - 15 yrs ago  . Female stress incontinence   . Glaucoma    Phreesia 07/02/2020  . Headache    otc med prn  . Hypertension   . Lumbago   . Missed  abortion    x 2 - no surgery required  . Paroxysmal supraventricular tachycardia (HCC)    Hx  . PONV (postoperative nausea and vomiting)   . Pure hypercholesterolemia   . SVD (spontaneous vaginal delivery)    x 4  . Unspecified glaucoma(365.9)    bilateral   Past Surgical History:  Procedure Laterality Date  . ABDOMINAL HYSTERECTOMY    . APPENDECTOMY    . COLONOSCOPY    . EYE SURGERY     bilateral cataract eye surgery  . EYE SURGERY     laser to relieve eye pressure - bilateral  . LAPAROTOMY N/A 02/06/2014   Procedure: EXPLORATORY LAPAROTOMY;  Surgeon: Lavonia Drafts, MD;  Location: [facility];  Service: Gynecology;  Laterality: N/A;  . MULTIPLE TOOTH EXTRACTIONS     upper  . right hand surgery     ganglion cyst removed  . TUBAL LIGATION     Current Outpatient Medications on File Prior to Visit  Medication Sig Dispense Refill  . acetaminophen (TYLENOL) 325 MG tablet Take 650 mg by mouth every 6 (six) hours as needed.    Marland Kitchen amLODipine (NORVASC) 10 MG tablet TAKE 1 TABLET BY MOUTH ONCE DAILY 90 tablet 3  . atenolol (TENORMIN) 50 MG tablet TAKE 1 TABLET BY MOUTH  ONCE DAILY 90 tablet 3  . brimonidine (ALPHAGAN) 0.2 % ophthalmic solution Place 1 drop into both eyes 2 (two) times daily.    . cholecalciferol (VITAMIN D3) 25 MCG (1000 UNIT) tablet Take 1,000 Units by mouth daily.    . cloNIDine (CATAPRES) 0.1 MG tablet TAKE 1 TABLET BY MOUTH IN THE MORNING AND 2 TABLETS AT BEDTIME 270 tablet 3  . furosemide (LASIX) 40 MG tablet TAKE 1 TABLET BY MOUTH ONCE DAILY AS NEEDED 90 tablet 1  . latanoprost (XALATAN) 0.005 % ophthalmic solution Place 1 drop into both eyes at bedtime.    Marland Kitchen lisinopril (ZESTRIL) 40 MG tablet TAKE 1 TABLET BY MOUTH ONCE A DAY 90 tablet 3  . pravastatin (PRAVACHOL) 80 MG tablet Take 1 tablet (80 mg total) by mouth daily. 90 tablet 3  . predniSONE (DELTASONE) 10 MG tablet Take 1 tablet (10 mg total) by mouth daily with breakfast. 30 tablet 0  . timolol (TIMOPTIC)  0.5 % ophthalmic solution Place 1 drop into both eyes 2 (two) times daily.    . traMADol (ULTRAM) 50 MG tablet Take 1 tablet (50 mg total) by mouth every 6 (six) hours as needed for severe pain. 30 tablet 0  . vitamin B-12 (CYANOCOBALAMIN) 100 MCG tablet Take 100 mcg by mouth daily.     No current facility-administered medications on file prior to visit.   Allergies  Allergen Reactions  . Codeine Phosphate Nausea And Vomiting    REACTION: unspecified  . Lipitor [Atorvastatin]   . Pneumococcal Vaccines Hives  . Sulfa Antibiotics     unknown  . Latex Hives, Itching and Rash   Social History   Socioeconomic History  . Marital status: Married    Spouse name: Not on file  . Number of children: Not on file  . Years of education: Not on file  . Highest education level: Not on file  Occupational History  . Occupation: raised tobacco    Employer: Retired  Tobacco Use  . Smoking status: Never  . Smokeless tobacco: Never  Substance and Sexual Activity  . Alcohol use: No  . Drug use: No  . Sexual activity: Not Currently    Birth control/protection: None, Post-menopausal, Surgical  Other Topics Concern  . Not on file  Social History Narrative    Has living will.  Daughter is HCPOA, Stephanie Acre.   Full code.   Social Determinants of Health   Financial Resource Strain: Low Risk   . Difficulty of Paying Living Expenses: Not hard at all  Food Insecurity: Not on file  Transportation Needs: Not on file  Physical Activity: Not on file  Stress: Not on file  Social Connections: Not on file  Intimate Partner Violence: Not on file     Review of Systems     Objective:   Physical Exam Vitals reviewed.  Constitutional:      Appearance: Normal appearance.  Cardiovascular:     Rate and Rhythm: Normal rate and regular rhythm.     Heart sounds: Normal heart sounds. No murmur heard.   No friction rub. No gallop.  Pulmonary:     Effort: Pulmonary effort is normal.     Breath  sounds: Normal breath sounds.  Musculoskeletal:     Right lower leg: No edema.     Left lower leg: No edema.  Neurological:     Mental Status: She is alert.          Assessment & Plan:  Essential hypertension, benign -  Plan: CBC with Differential/Platelet, COMPLETE METABOLIC PANEL WITH GFR, Lipid panel  Peripheral edema - Plan: CBC with Differential/Platelet, COMPLETE METABOLIC PANEL WITH GFR, Lipid panel  HYPERCHOLESTEROLEMIA, PURE - Plan: CBC with Differential/Platelet, COMPLETE METABOLIC PANEL WITH GFR, Lipid panel  PMR (polymyalgia rheumatica) (HCC) The patient's clinical picture certainly supports PMR.  She is currently on 10 mg a day of prednisone so I do not feel that checking a sed rate would add much to her diagnosis at this point.  Her blood pressure today is outstanding.  I will check a CBC CMP and a lipid panel.  She is on a high-dose statin so if symptoms worsen I would consider discontinuing her statin just to see if there could be an element of statin induced myopathy.  However at the present time, I have recommended that we gradually wean her off prednisone slowly.  At the end of this month I would step her down to 7.5 mg daily if possible and decrease in a similar fashion every 3 to 4 weeks as tolerated.  Depending upon whichever dosage creates an issue, we may elect just to continue her on that dose of prednisone indefinitely given her life expectancy.  However if she is unable to wean off prednisone at all, consider rheumatology for methotrexate.  Patient will let me know her decision as she has yet to follow back up with Dr. Ernestina Patches to discuss their plan.  I will be glad to help any way I can.

## 2020-11-06 ENCOUNTER — Other Ambulatory Visit: Payer: Self-pay | Admitting: Physical Medicine and Rehabilitation

## 2020-11-06 DIAGNOSIS — M545 Low back pain, unspecified: Secondary | ICD-10-CM

## 2020-11-06 LAB — LIPID PANEL
Cholesterol: 225 mg/dL — ABNORMAL HIGH (ref <200)
HDL: 58 mg/dL (ref >=50)
LDL Cholesterol (Calc): 138 mg/dL (calc) — ABNORMAL HIGH
Non-HDL Cholesterol (Calc): 167 mg/dL (calc) — ABNORMAL HIGH (ref <130)
Total CHOL/HDL Ratio: 3.9 (calc) (ref <5.0)
Triglycerides: 154 mg/dL — ABNORMAL HIGH (ref <150)

## 2020-11-06 LAB — COMPLETE METABOLIC PANEL WITH GFR
AG Ratio: 1.8 (calc) (ref 1.0–2.5)
ALT: 9 U/L (ref 6–29)
AST: 12 U/L (ref 10–35)
Albumin: 4.3 g/dL (ref 3.6–5.1)
Alkaline phosphatase (APISO): 68 U/L (ref 37–153)
BUN/Creatinine Ratio: 19 (calc) (ref 6–22)
BUN: 18 mg/dL (ref 7–25)
CO2: 29 mmol/L (ref 20–32)
Calcium: 9.5 mg/dL (ref 8.6–10.4)
Chloride: 103 mmol/L (ref 98–110)
Creat: 0.97 mg/dL — ABNORMAL HIGH (ref 0.60–0.95)
Globulin: 2.4 g/dL (calc) (ref 1.9–3.7)
Glucose, Bld: 95 mg/dL (ref 65–99)
Potassium: 4.3 mmol/L (ref 3.5–5.3)
Sodium: 140 mmol/L (ref 135–146)
Total Bilirubin: 0.7 mg/dL (ref 0.2–1.2)
Total Protein: 6.7 g/dL (ref 6.1–8.1)
eGFR: 58 mL/min/{1.73_m2} — ABNORMAL LOW (ref >=60)

## 2020-11-06 LAB — CBC WITH DIFFERENTIAL/PLATELET
Absolute Monocytes: 310 cells/uL (ref 200–950)
Basophils Absolute: 43 cells/uL (ref 0–200)
Basophils Relative: 0.5 %
Eosinophils Absolute: 120 cells/uL (ref 15–500)
Eosinophils Relative: 1.4 %
HCT: 41.2 % (ref 35.0–45.0)
Hemoglobin: 14 g/dL (ref 11.7–15.5)
Lymphs Abs: 972 cells/uL (ref 850–3900)
MCH: 29.5 pg (ref 27.0–33.0)
MCHC: 34 g/dL (ref 32.0–36.0)
MCV: 86.7 fL (ref 80.0–100.0)
MPV: 8.9 fL (ref 7.5–12.5)
Monocytes Relative: 3.6 %
Neutro Abs: 7155 cells/uL (ref 1500–7800)
Neutrophils Relative %: 83.2 %
Platelets: 224 10*3/uL (ref 140–400)
RBC: 4.75 10*6/uL (ref 3.80–5.10)
RDW: 13 % (ref 11.0–15.0)
Total Lymphocyte: 11.3 %
WBC: 8.6 10*3/uL (ref 3.8–10.8)

## 2020-11-07 ENCOUNTER — Ambulatory Visit: Payer: Self-pay

## 2020-11-07 ENCOUNTER — Encounter: Payer: Self-pay | Admitting: Physical Medicine and Rehabilitation

## 2020-11-07 ENCOUNTER — Ambulatory Visit (INDEPENDENT_AMBULATORY_CARE_PROVIDER_SITE_OTHER): Payer: PPO | Admitting: Physical Medicine and Rehabilitation

## 2020-11-07 ENCOUNTER — Other Ambulatory Visit: Payer: Self-pay

## 2020-11-07 VITALS — BP 170/82 | HR 79

## 2020-11-07 DIAGNOSIS — M5416 Radiculopathy, lumbar region: Secondary | ICD-10-CM

## 2020-11-07 DIAGNOSIS — M542 Cervicalgia: Secondary | ICD-10-CM

## 2020-11-07 DIAGNOSIS — G8929 Other chronic pain: Secondary | ICD-10-CM

## 2020-11-07 DIAGNOSIS — M353 Polymyalgia rheumatica: Secondary | ICD-10-CM | POA: Diagnosis not present

## 2020-11-07 DIAGNOSIS — M5442 Lumbago with sciatica, left side: Secondary | ICD-10-CM

## 2020-11-07 LAB — CBC WITH DIFFERENTIAL/PLATELET
Absolute Monocytes: 469 cells/uL (ref 200–950)
Basophils Absolute: 27 cells/uL (ref 0–200)
Basophils Relative: 0.4 %
Eosinophils Absolute: 147 cells/uL (ref 15–500)
Eosinophils Relative: 2.2 %
HCT: 40.6 % (ref 35.0–45.0)
Hemoglobin: 13.7 g/dL (ref 11.7–15.5)
Lymphs Abs: 1166 cells/uL (ref 850–3900)
MCH: 29.6 pg (ref 27.0–33.0)
MCHC: 33.7 g/dL (ref 32.0–36.0)
MCV: 87.7 fL (ref 80.0–100.0)
MPV: 9.2 fL (ref 7.5–12.5)
Monocytes Relative: 7 %
Neutro Abs: 4891 cells/uL (ref 1500–7800)
Neutrophils Relative %: 73 %
Platelets: 158 10*3/uL (ref 140–400)
RBC: 4.63 10*6/uL (ref 3.80–5.10)
RDW: 13.1 % (ref 11.0–15.0)
Total Lymphocyte: 17.4 %
WBC: 6.7 10*3/uL (ref 3.8–10.8)

## 2020-11-07 LAB — RHEUMATOID FACTOR: Rheumatoid fact SerPl-aCnc: <14 IU/mL (ref <14)

## 2020-11-07 LAB — ANA: Anti Nuclear Antibody (ANA): POSITIVE — AB

## 2020-11-07 LAB — HLA-B27 ANTIGEN: HLA-B27 Antigen: NEGATIVE

## 2020-11-07 LAB — ANTI-NUCLEAR AB-TITER (ANA TITER): ANA Titer 1: 1:320 {titer} — ABNORMAL HIGH

## 2020-11-07 LAB — SEDIMENTATION RATE: Sed Rate: 6 mm/h (ref 0–30)

## 2020-11-07 LAB — C-REACTIVE PROTEIN: CRP: 12.4 mg/L — ABNORMAL HIGH (ref <8.0)

## 2020-11-07 MED ORDER — METHYLPREDNISOLONE ACETATE 80 MG/ML IJ SUSP
80.0000 mg | Freq: Once | INTRAMUSCULAR | Status: AC
  Administered 2020-11-07: 80 mg

## 2020-11-07 NOTE — Patient Instructions (Signed)

## 2020-11-07 NOTE — Progress Notes (Signed)
Pt state lower back pain that travels down both legs. Pt state walking, standing and bending makes the pain worse. Pt state she takes pain meds to help ease her pain. Pt has hx of inj on 09/12/20 pt state it helped a little.  Numeric Pain Rating Scale and Functional Assessment Average Pain 4   In the last MONTH (on 0-10 scale) has pain interfered with the following?  1. General activity like being  able to carry out your everyday physical activities such as walking, climbing stairs, carrying groceries, or moving a chair?  Rating(10)   +Driver, -BT, -Dye Allergies.

## 2020-11-08 ENCOUNTER — Encounter: Payer: Self-pay | Admitting: *Deleted

## 2020-11-11 ENCOUNTER — Ambulatory Visit: Payer: PPO | Admitting: Physical Therapy

## 2020-11-11 ENCOUNTER — Encounter: Payer: Self-pay | Admitting: Physical Therapy

## 2020-11-11 ENCOUNTER — Other Ambulatory Visit: Payer: Self-pay

## 2020-11-11 DIAGNOSIS — M6281 Muscle weakness (generalized): Secondary | ICD-10-CM

## 2020-11-11 DIAGNOSIS — M5442 Lumbago with sciatica, left side: Secondary | ICD-10-CM | POA: Diagnosis not present

## 2020-11-11 DIAGNOSIS — R262 Difficulty in walking, not elsewhere classified: Secondary | ICD-10-CM | POA: Diagnosis not present

## 2020-11-11 DIAGNOSIS — G8929 Other chronic pain: Secondary | ICD-10-CM | POA: Diagnosis not present

## 2020-11-11 NOTE — Patient Instructions (Signed)
Access Code: [email or web address] URL: [email or web address] Date: 11/11/2020 Prepared by: Kearney Hard  Exercises Supine Lower Trunk Rotation - 2 x daily - 7 x weekly - 3 reps - 10-20 seconds hold Supine Bridge - 2 x daily - 7 x weekly - 2 sets - 10 reps - 5 seconds hold Hooklying Single Knee to Chest Stretch - 2 x daily - 7 x weekly - 3 reps - 10-20 seconds hold Supine Figure 4 Piriformis Stretch - 2 x daily - 7 x weekly - 3 reps - 10-20- seconds hold Supine Hamstring Stretch with Strap - 2 x daily - 7 x weekly - 3 reps - 10-20 seconds hold

## 2020-11-11 NOTE — Therapy (Signed)
[facility] [address] [place], [place], [address] Phone: [phone]   Fax:  [phone]  Physical Therapy Evaluation  Patient Details  Name: Debra [name] MRN: [id number] Date of Birth: 1934/07/01 Referring Provider (PT): Magnus Sinning MD  Encounter Date: 11/11/2020   PT End of Session - 11/11/20 1423     Visit Number 1    Number of Visits 8    Date for PT Re-Evaluation 12/27/20    Authorization Type Pt stating 2x/ week was too much for her therefore pt's frequency was decreased to 1x/ week for 6 weeks.    PT Start Time 1100    PT Stop Time 1145    PT Time Calculation (min) 45 min    Activity Tolerance Patient tolerated treatment well    Behavior During Therapy WFL for tasks assessed/performed             Past Medical History:  Diagnosis Date   Arthritis    Phreesia 07/02/2020   Brain mass    monitor by Dr Trenton Gammon - right posterior fossa meningioma - thinks it's benign per patient   Cancer ([place])    hand, face - spots removed   Constipation    Degeneration of lumbar or lumbosacral intervertebral disc    arthritis in back, hips and hands - otc med prn   Diarrhea    Diverticulosis of colon with hemorrhage    Dysrhythmia    Hx irregular heart beat - 15 yrs ago   Female stress incontinence    Glaucoma    Phreesia 07/02/2020   Headache    otc med prn   Hypertension    Lumbago    Missed abortion    x 2 - no surgery required   Paroxysmal supraventricular tachycardia (HCC)    Hx   PONV (postoperative nausea and vomiting)    Pure hypercholesterolemia    SVD (spontaneous vaginal delivery)    x 4   Unspecified glaucoma(365.9)    bilateral    Past Surgical History:  Procedure Laterality Date   ABDOMINAL HYSTERECTOMY     APPENDECTOMY     COLONOSCOPY     EYE SURGERY     bilateral cataract eye surgery   EYE SURGERY     laser to relieve eye pressure - bilateral   LAPAROTOMY N/A 02/06/2014   Procedure: EXPLORATORY  LAPAROTOMY;  Surgeon: Lavonia Drafts, MD;  Location: [facility];  Service: Gynecology;  Laterality: N/A;   MULTIPLE TOOTH EXTRACTIONS     upper   right hand surgery     ganglion cyst removed   TUBAL LIGATION      There were no vitals filed for this visit.    Subjective Assessment - 11/11/20 1112     Subjective Pt arriving to therapy with her daughter for PT evaluation of Low back pain with left sided sciatica with pain also noted in right low back and glutes. Pt stating she has difficulty with bending, stiting on toilet and household activities. Pt stating pain is 3/10 at rest. Pt reports pain can increase to 8-9/10. Pt stating steriods helps and the injection on 11/07/2020 seems to help a little. Pt stating she has difficulty picking up her small dog due to weakness.    Patient is accompained by: Family member   daughter Tye [place]   Limitations House hold activities;Other (comment);Lifting;Walking    Diagnostic tests Images:1. Advanced facet hypertrophy and grade 2 anterolisthesis at L4-5  with moderate central and foraminal narrowing bilaterally, slightly  worse on the right.  2. Moderate left and mild right foraminal stenosis at L2-3 secondary  to a broad-based disc protrusion and moderate facet hypertrophy  bilaterally.  3. Mild foraminal narrowing bilaterally at L3-4 is worse on the  left.  4. Moderate facet hypertrophy at L5-S1 is worse on the right. There  is no significant disc protrusion or stenosis.    Patient Stated Goals move better    Currently in Pain? Yes    Pain Score 3     Pain Location Back    Pain Orientation Left;Lower;Right    Pain Descriptors / Indicators Aching    Pain Type Chronic pain    Pain Radiating Towards glutes    Pain Onset More than a month ago    Pain Frequency Constant    Aggravating Factors  bending    Pain Relieving Factors steriods, tylenol    Effect of Pain on Daily Activities difficulty getting up and down off toilet, lifitng and bending                 [facility] PT Assessment - 11/11/20 0001       Assessment   Medical Diagnosis M54.42.left side low back pain with sciatica, M43.16 arterolisthesis of lumbar spine    Referring Provider (PT) Magnus Sinning MD    Hand Dominance Left    Prior Therapy yes ago on [address]      Precautions   Precautions None      Restrictions   Weight Bearing Restrictions No      Balance Screen   Has the patient fallen in the past 6 months No    Is the patient reluctant to leave their home because of a fear of falling?  No      Home Environment   Living Environment Private residence    Living Arrangements Alone   her son and nephew live next door   Available Help at Discharge Family    Type of [place] One level    Additional Comments one small step in her home between her kitchen and her sun room      Prior Function   Level of Independence Independent    Vocation Retired    Public house manager, cooking      Observation/Other Assessments   Focus on Therapeutic Outcomes (FOTO)  51% (predicted 56%)      Posture/Postural Control   Posture/Postural Control Postural limitations    Postural Limitations Rounded Shoulders;Forward head;Decreased lumbar lordosis      ROM / Strength   AROM / PROM / Strength AROM      AROM   AROM Assessment Site Lumbar    Lumbar Flexion 50    Lumbar Extension 10    Lumbar - Right Side Bend 30    Lumbar - Left Side Bend 20    Lumbar - Right Rotation limited 50%    Lumbar - Left Rotation limited 50%      Palpation   Palpation comment TTP: lumbar paraspinals      Special Tests   Other special tests + straight leg raise on the left      Transfers   Five time sit to stand comments  26 seconds with UE support      Ambulation/Gait   Assistive device None    Gait Pattern Decreased stride length;Decreased dorsiflexion - right;Decreased dorsiflexion - left;Decreased hip/knee flexion - right;Decreased  hip/knee flexion - left;Poor  foot clearance - left;Poor foot clearance - right      High Level Balance   High Level Balance Comments SLS: right: 0 seconds, Left 0 seconds                        Objective measurements completed on examination: See above findings.                PT Education - 11/11/20 1422     Education Details PT POC, HEP    Person(s) Educated Patient;Other (comment)   pt's daugher   Methods Explanation;Handout;Verbal cues;Tactile cues;Demonstration    Comprehension Verbalized understanding;Need further instruction              PT Short Term Goals - 11/11/20 1426       PT SHORT TERM GOAL #1   Title Pt will be independent in her initial HEP.    Baseline 3    Period Weeks    Status New    Target Date 12/06/20      PT SHORT TERM GOAL #2   Title Pt will improve her 5 time sit to stand to </= 18 seconds    Baseline 26 seconds    Time 4    Period Weeks    Status New    Target Date 12/13/20               PT Long Term Goals - 11/11/20 1427       PT LONG TERM GOAL #1   Title Pt will be indpendent in her advanced HEP.    Time 6    Period Weeks    Status New    Target Date 12/27/20      PT LONG TERM GOAL #2   Title Pt will be able to report decreased pain with ADL's of </= 2/10.    Time 6    Period Weeks    Status New    Target Date 12/27/20      PT LONG TERM GOAL #3   Title Pt will be able to pick up object from floor safely with back pain of </= 3/10.    Time 6    Period Weeks    Status New    Target Date 12/27/20      PT LONG TERM GOAL #4   Title Pt will improve her FOTO from 51% to >/= 56%.    Time 6    Period Weeks    Status New      PT LONG TERM GOAL #5   Title Pt will be able to stand for >/= 20 minutes with pain </= 2/10 for preparing meals.    Time 6    Period Weeks    Status New    Target Date 12/27/20                    Plan - 11/11/20 1526     Clinical Impression Statement Pt  presenting today for PT evaluation for her low back pain with left sided sciatica. Pt dx with anterolisthesis of lumbar spine with degenerative changes per images. Pt s/p lumbar injection on 11/07/2020 which pt reports has helped some, but hasn't taken the pain completely away. Pt also stating that steriods have worked in the past. Pt presenting today with postive SLR on the left, bilateral hamstring tightness and lumbar decresaed AROM. Pt with bilateral weakness noted in her hips and bilateral shoulder. Skilled PT needed to address  pt's impairments with the below interventions.    Personal Factors and Comorbidities Comorbidity 3+    Comorbidities HOH, peripheral edema, HTN, glaucoma, degenerative disc disease lumbar spine, osteopenia    Examination-Activity Limitations Stand;Transfers;Toileting;Sit;Squat;Stairs;Carry;Lift    Examination-Participation Restrictions Other;Community Activity;Meal Prep    Stability/Clinical Decision Making Stable/Uncomplicated    Clinical Decision Making Moderate    Rehab Potential Fair    PT Frequency 1x / week   frequency limited due to pt's refusal for 2x / week   PT Duration 6 weeks    PT Treatment/Interventions ADLs/Self Care Home Management;Cryotherapy;Electrical Stimulation;Ultrasound;Gait training;Stair training;Functional mobility training;Therapeutic activities;Therapeutic exercise;Balance training;Neuromuscular re-education;Cognitive remediation;Patient/family education;Taping;Passive range of motion;Dry needling;Manual techniques;Moist Heat    PT Next Visit Plan Perform TUG vs BERG for balance assessment and create goal if appropriate, Nustep, lumbar stretching, LE hip strenggthening, sit to stand, functional mobility    PT Home Exercise Plan Signed   Access Code: [email or web address]  URL: [email or web address]  Date: 11/11/2020  Prepared by: Kearney Hard     Exercises  Supine Lower Trunk Rotation - 2 x daily - 7 x weekly - 3 reps - 10-20 seconds hold  Supine  Bridge - 2 x daily - 7 x weekly - 2 sets - 10 reps - 5 seconds hold  Hooklying Single Knee to Chest Stretch - 2 x daily - 7 x weekly - 3 reps - 10-20 seconds hold  Supine Figure 4 Piriformis Stretch - 2 x daily - 7 x weekly - 3 reps - 10-20- seconds hold  Supine Hamstring Stretch with Strap - 2 x daily - 7 x weekly - 3 reps - 10-20 seconds hold          Last Modified by Oretha Caprice, PT on 11/11/2020 at  2:22 PM    Consulted and Agree with Plan of Care Patient             Patient will benefit from skilled therapeutic intervention in order to improve the following deficits and impairments:  Pain, Difficulty walking, Impaired flexibility, Decreased balance, Decreased activity tolerance, Decreased range of motion, Decreased strength, Postural dysfunction  Visit Diagnosis: Chronic left-sided low back pain with left-sided sciatica  Muscle weakness (generalized)  Difficulty in walking, not elsewhere classified     Problem List Patient Active Problem List   Diagnosis Date Noted   Hearing impaired 05/03/2019   Osteoarthritis of both knees 08/26/2016   Osteoarthritis, hand 08/26/2016   Peripheral edema 05/22/2014   Meningioma ([place]) 04/29/2012   Vestibular schwannoma ([place]) 03/26/2012   Vitamin D deficiency 09/24/2009   Vitamin B12 deficiency 07/05/2009   OSTEOPENIA 10/16/2008   Essential hypertension, benign 09/05/2008   MELANOMA, HX OF 09/05/2008   ISCHEMIC COLITIS, HX OF 05/26/2007   Unspecified glaucoma 04/21/2006   FEMALE STRESS INCONTINENCE 04/21/2006   DEGENERATIVE [place] DISEASE, LUMBAR SPINE 04/21/2006   DIVERTICULOSIS, COLON [place] 12/01/2005    Oretha Caprice, PT, MPT 11/11/2020, 4:39 PM  [facility] [address] [place], [place], [address] Phone: [phone]   Fax:  [phone]  Name: KYRIA SPADAFORA MRN: [id number] Date of Birth: 31-Oct-1934

## 2020-11-15 ENCOUNTER — Other Ambulatory Visit: Payer: Self-pay | Admitting: *Deleted

## 2020-11-15 MED ORDER — AMLODIPINE BESYLATE 10 MG PO TABS: 10.0000 mg | ORAL_TABLET | Freq: Every day | ORAL | 3 refills | Status: DC

## 2020-11-16 ENCOUNTER — Encounter: Payer: Self-pay | Admitting: Physical Medicine and Rehabilitation

## 2020-11-16 NOTE — Addendum Note (Signed)
Addended by: Raymondo Band on: 11/16/2020 04:51 PM   Modules accepted: Level of Service

## 2020-11-16 NOTE — Procedures (Signed)
Lumbosacral Transforaminal Epidural Steroid Injection - Sub-Pedicular Approach with Fluoroscopic Guidance  Patient: Debra [name]      Date of Birth: 09-19-1934 MRN: [id number] PCP: Susy Frizzle, MD      Visit Date: 11/07/2020   Universal Protocol:    Date/Time: 11/07/2020  Consent Given By: the patient  Position: PRONE  Additional Comments: Vital signs were monitored before and after the procedure. Patient was prepped and draped in the usual sterile fashion. The correct patient, procedure, and site was verified.   Injection Procedure Details:   Procedure diagnoses: Lumbar radiculopathy [M54.16]    Meds Administered:  Meds ordered this encounter  Medications   methylPREDNISolone acetate (DEPO-MEDROL) injection 80 mg    Laterality: Bilateral  Location/Site: L5  Needle:5.0 in., 22 ga.  Short bevel or Quincke spinal needle  Needle Placement: Transforaminal  Findings:    -Comments: Excellent flow of contrast along the nerve, nerve root and into the epidural space.  Procedure Details: After squaring off the end-plates to get a true AP view, the C-arm was positioned so that an oblique view of the foramen as noted above was visualized. The target area is just inferior to the "nose of the scotty dog" or sub pedicular. The soft tissues overlying this structure were infiltrated with 2-3 ml. of 1% Lidocaine without Epinephrine.  The spinal needle was inserted toward the target using a "trajectory" view along the fluoroscope beam.  Under AP and lateral visualization, the needle was advanced so it did not puncture dura and was located close the 6 O'Clock position of the pedical in AP tracterory. Biplanar projections were used to confirm position. Aspiration was confirmed to be negative for CSF and/or blood. A 1-2 ml. volume of Isovue-250 was injected and flow of contrast was noted at each level. Radiographs were obtained for documentation purposes.   After attaining the  desired flow of contrast documented above, a 0.5 to 1.0 ml test dose of 0.25% Marcaine was injected into each respective transforaminal space.  The patient was observed for 90 seconds post injection.  After no sensory deficits were reported, and normal lower extremity motor function was noted,   the above injectate was administered so that equal amounts of the injectate were placed at each foramen (level) into the transforaminal epidural space.   Additional Comments:  The patient tolerated the procedure well Dressing: 2 x 2 sterile gauze and Band-Aid    Post-procedure details: Patient was observed during the procedure. Post-procedure instructions were reviewed.  Patient left the clinic in stable condition.

## 2020-11-16 NOTE — Progress Notes (Signed)
Debra [name] - 86 y.o. female MRN [id number]  Date of birth: 13-Mar-1934  Office Visit Note: Visit Date: 11/07/2020 PCP: Donita Brooks, MD Referred by: Donita Brooks, MD  Subjective: Chief Complaint  Patient presents with   Lower Back - Pain   Left Leg - Pain   Right Leg - Pain   HPI:  Debra [name] is a 86 y.o. female who comes in today She returns today for review of lab work from her previous visit as well as possible epidural injection following MRI of the lumbar spine.  We did review the lumbar spine MRI with her.  This continues to show an almost grade 2 listhesis of L4 on L5 without spondylolysis.  There is however no real central canal stenosis and I do wonder if this was a spondylolysis early on.  She does have lateral recess narrowing and moderate canal narrowing with severe facet arthropathy at L4-5 and moderate at L5-S1.  She continues to have pain really in the neck and upper shoulders as well as all over but specific pain across the lower back and down the legs.  I have felt like she has had a presumptive diagnosis of possibly polymyalgia rheumatica.  Lab values were obtained to screen for rheumatologic issues.  This did turn up a positive ANA 1:320 -nuclear centromere and CRP of 12.4 but normal ESR.  The CRP was high despite the fact that she had had a course of prednisone prior to that lab draw.  As noted in our previous notes the first course of prednisone seem to alleviate her symptoms almost completely.  We did place her on a lower dose recently after the lab work and she has not felt as much relief with the lower dose.  We are going to make referral to Dr. Sheliah Hatch for evaluation of rheumatologic disease process.  I do think her hip and legs though are consistent with the listhesis and stenosis.  We will complete an injection today bilateral L5 transforaminal injection.  She has tried and failed other manner of conservative care.  She continues with  physical therapy.  ROS Otherwise per HPI.  Assessment & Plan: Visit Diagnoses:    ICD-10-CM   1. Lumbar radiculopathy  M54.16 XR C-ARM NO REPORT    Epidural Steroid injection    methylPREDNISolone acetate (DEPO-MEDROL) injection 80 mg    2. Polymyalgia rheumatica (HCC)  M35.3 Ambulatory referral to Rheumatology    3. Cervicalgia  M54.2 Ambulatory referral to Rheumatology    4. Chronic bilateral low back pain with bilateral sciatica  M54.42 Ambulatory referral to Rheumatology   M54.41    G89.29       Plan: No additional findings.   Meds & Orders:  Meds ordered this encounter  Medications   methylPREDNISolone acetate (DEPO-MEDROL) injection 80 mg    Orders Placed This Encounter  Procedures   XR C-ARM NO REPORT   Ambulatory referral to Rheumatology   Epidural Steroid injection    Follow-up: Return if symptoms worsen or fail to improve.   Procedures: No procedures performed  Lumbosacral Transforaminal Epidural Steroid Injection - Sub-Pedicular Approach with Fluoroscopic Guidance  Patient: Debra [name]      Date of Birth: 1934/09/11 MRN: [id number] PCP: Donita Brooks, MD      Visit Date: 11/07/2020   Universal Protocol:    Date/Time: 11/07/2020  Consent Given By: the patient  Position: PRONE  Additional Comments: Vital signs were monitored before and after the  procedure. Patient was prepped and draped in the usual sterile fashion. The correct patient, procedure, and site was verified.   Injection Procedure Details:   Procedure diagnoses: Lumbar radiculopathy [M54.16]    Meds Administered:  Meds ordered this encounter  Medications   methylPREDNISolone acetate (DEPO-MEDROL) injection 80 mg    Laterality: Bilateral  Location/Site: L5  Needle:5.0 in., 22 ga.  Short bevel or Quincke spinal needle  Needle Placement: Transforaminal  Findings:    -Comments: Excellent flow of contrast along the nerve, nerve root and into the epidural  space.  Procedure Details: After squaring off the end-plates to get a true AP view, the C-arm was positioned so that an oblique view of the foramen as noted above was visualized. The target area is just inferior to the "nose of the scotty dog" or sub pedicular. The soft tissues overlying this structure were infiltrated with 2-3 ml. of 1% Lidocaine without Epinephrine.  The spinal needle was inserted toward the target using a "trajectory" view along the fluoroscope beam.  Under AP and lateral visualization, the needle was advanced so it did not puncture dura and was located close the 6 O'Clock position of the pedical in AP tracterory. Biplanar projections were used to confirm position. Aspiration was confirmed to be negative for CSF and/or blood. A 1-2 ml. volume of Isovue-250 was injected and flow of contrast was noted at each level. Radiographs were obtained for documentation purposes.   After attaining the desired flow of contrast documented above, a 0.5 to 1.0 ml test dose of 0.25% Marcaine was injected into each respective transforaminal space.  The patient was observed for 90 seconds post injection.  After no sensory deficits were reported, and normal lower extremity motor function was noted,   the above injectate was administered so that equal amounts of the injectate were placed at each foramen (level) into the transforaminal epidural space.   Additional Comments:  The patient tolerated the procedure well Dressing: 2 x 2 sterile gauze and Band-Aid    Post-procedure details: Patient was observed during the procedure. Post-procedure instructions were reviewed.  Patient left the clinic in stable condition.    Clinical History: CLINICAL DATA:  Low back pain for greater than 6 weeks. Pain is into the lower extremities. Leg weakness.   EXAM: MRI LUMBAR SPINE WITHOUT CONTRAST   TECHNIQUE: Multiplanar, multisequence MR imaging of the lumbar spine was performed. No intravenous contrast  was administered.   COMPARISON:  None.   FINDINGS: Segmentation: 5 non rib-bearing lumbar type vertebral bodies are present. The lowest fully formed vertebral body is L5.   Alignment: 8 mm grade 2 anterolisthesis at L4-5 secondary to degenerative facet disease. Slight retrolisthesis present at T12-L1, L1-2, L2-3 and L3-4. Lumbar lordosis preserved. Mild rightward curvature centered at L2-3.   Vertebrae: Mixed edematous and fatty endplate marrow changes noted on the right at L4-5. Small hemangioma present at L2. Marrow signal and vertebral body heights otherwise normal.   Conus medullaris and cauda equina: Conus extends to the L1 level. Conus and cauda equina appear normal.   Paraspinal and other soft tissues: Limited imaging the abdomen is unremarkable. There is no significant adenopathy. No solid organ lesions are present.   Disc levels:   T12-L1: Mild facet hypertrophy is present. No significant disc protrusion or stenosis is present.   L1-2: Mild facet hypertrophy is present. Slight uncovering of the disc noted. No significant stenosis.   L2-3: Moderate facet hypertrophy present bilaterally. Broad-based disc protrusion noted. The central canal  is patent. Moderate left and mild right foraminal narrowing is present.   L3-4: Broad-based disc protrusion extends into the foramina bilaterally. Moderate facet hypertrophy noted bilaterally. The central canal is patent. Mild foraminal narrowing is worse on the left.   L4-5: Advanced facet hypertrophy is present. Uncovering of a broad-based disc protrusion noted with moderate central and foraminal narrowing bilaterally, slightly worse on the right.   L5-S1: Moderate facet hypertrophy is worse on the right. No significant disc protrusion or stenosis.   IMPRESSION: 1. Advanced facet hypertrophy and grade 2 anterolisthesis at L4-5 with moderate central and foraminal narrowing bilaterally, slightly worse on the right. 2.  Moderate left and mild right foraminal stenosis at L2-3 secondary to a broad-based disc protrusion and moderate facet hypertrophy bilaterally. 3. Mild foraminal narrowing bilaterally at L3-4 is worse on the left. 4. Moderate facet hypertrophy at L5-S1 is worse on the right. There is no significant disc protrusion or stenosis.     Electronically Signed   By: San Morelle M.D.   On: 11/03/2020 10:53     Objective:  VS:  HT:    WT:   BMI:     BP:(!) 170/82  HR:79bpm  TEMP: ( )  RESP:  Physical Exam Vitals and nursing note reviewed.  Constitutional:      General: She is not in acute distress.    Appearance: Normal appearance. She is not ill-appearing.  HENT:     Head: Normocephalic and atraumatic.     Right Ear: External ear normal.     Left Ear: External ear normal.  Eyes:     Extraocular Movements: Extraocular movements intact.  Cardiovascular:     Rate and Rhythm: Normal rate.     Pulses: Normal pulses.  Pulmonary:     Effort: Pulmonary effort is normal. No respiratory distress.  Abdominal:     General: There is no distension.     Palpations: Abdomen is soft.  Musculoskeletal:        General: Tenderness present.     Cervical back: Neck supple.     Right lower leg: No edema.     Left lower leg: No edema.     Comments: Patient has good distal strength with no pain over the greater trochanters.  No clonus or focal weakness.  Skin:    Findings: No erythema, lesion or rash.  Neurological:     General: No focal deficit present.     Mental Status: She is alert and oriented to person, place, and time.     Sensory: No sensory deficit.     Motor: No weakness or abnormal muscle tone.     Coordination: Coordination normal.  Psychiatric:        Mood and Affect: Mood normal.        Behavior: Behavior normal.     Imaging: No results found.

## 2020-11-18 ENCOUNTER — Ambulatory Visit (INDEPENDENT_AMBULATORY_CARE_PROVIDER_SITE_OTHER): Payer: PPO | Admitting: Physical Therapy

## 2020-11-18 ENCOUNTER — Other Ambulatory Visit: Payer: Self-pay

## 2020-11-18 ENCOUNTER — Encounter: Payer: Self-pay | Admitting: Physical Therapy

## 2020-11-18 DIAGNOSIS — G8929 Other chronic pain: Secondary | ICD-10-CM | POA: Diagnosis not present

## 2020-11-18 DIAGNOSIS — M5442 Lumbago with sciatica, left side: Secondary | ICD-10-CM

## 2020-11-18 DIAGNOSIS — R262 Difficulty in walking, not elsewhere classified: Secondary | ICD-10-CM | POA: Diagnosis not present

## 2020-11-18 DIAGNOSIS — M6281 Muscle weakness (generalized): Secondary | ICD-10-CM

## 2020-11-18 NOTE — Therapy (Signed)
[facility] [address] [place], [place], [address] Phone: [phone]   Fax:  [phone]  Physical Therapy Treatment  Patient Details  Name: Debra [name] MRN: [id number] Date of Birth: April 14, 1934 Referring Provider (PT): Magnus Sinning MD   Encounter Date: 11/18/2020   PT End of Session - 11/18/20 1114     Visit Number 2    Number of Visits 8    Date for PT Re-Evaluation 12/27/20    Authorization Type Pt stating 2x/ week was too much for her therefore pt's frequency was decreased to 1x/ week for 6 weeks.    PT Start Time 1103    PT Stop Time 1141    PT Time Calculation (min) 38 min    Activity Tolerance Patient tolerated treatment well    Behavior During Therapy WFL for tasks assessed/performed             Past Medical History:  Diagnosis Date   Arthritis    Phreesia 07/02/2020   Brain mass    monitor by Dr Trenton Gammon - right posterior fossa meningioma - thinks it's benign per patient   Cancer ([place])    hand, face - spots removed   Constipation    Degeneration of lumbar or lumbosacral intervertebral disc    arthritis in back, hips and hands - otc med prn   Diarrhea    Diverticulosis of colon with hemorrhage    Dysrhythmia    Hx irregular heart beat - 15 yrs ago   Female stress incontinence    Glaucoma    Phreesia 07/02/2020   Headache    otc med prn   Hypertension    Lumbago    Missed abortion    x 2 - no surgery required   Paroxysmal supraventricular tachycardia (HCC)    Hx   PONV (postoperative nausea and vomiting)    Pure hypercholesterolemia    SVD (spontaneous vaginal delivery)    x 4   Unspecified glaucoma(365.9)    bilateral    Past Surgical History:  Procedure Laterality Date   ABDOMINAL HYSTERECTOMY     APPENDECTOMY     COLONOSCOPY     EYE SURGERY     bilateral cataract eye surgery   EYE SURGERY     laser to relieve eye pressure - bilateral   LAPAROTOMY N/A 02/06/2014   Procedure: EXPLORATORY  LAPAROTOMY;  Surgeon: Lavonia Drafts, MD;  Location: [facility];  Service: Gynecology;  Laterality: N/A;   MULTIPLE TOOTH EXTRACTIONS     upper   right hand surgery     ganglion cyst removed   TUBAL LIGATION      There were no vitals filed for this visit.   Subjective Assessment - 11/18/20 1109     Subjective Pt arriving today with her daughter. Daughter stated pt has been doing her exericses 2x each day.    Patient is accompained by: Family member    Limitations House hold activities;Other (comment);Lifting;Walking    Diagnostic tests Images:1. Advanced facet hypertrophy and grade 2 anterolisthesis at L4-5  with moderate central and foraminal narrowing bilaterally, slightly  worse on the right.  2. Moderate left and mild right foraminal stenosis at L2-3 secondary  to a broad-based disc protrusion and moderate facet hypertrophy  bilaterally.  3. Mild foraminal narrowing bilaterally at L3-4 is worse on the  left.  4. Moderate facet hypertrophy at L5-S1 is worse on the right. There  is no significant disc protrusion or stenosis.    Patient Stated  Goals move better    Currently in Pain? Yes    Pain Score 2     Pain Location Back    Pain Orientation Lower;Left;Right    Pain Descriptors / Indicators Aching    Pain Type Chronic pain    Pain Onset More than a month ago                [facility] PT Assessment - 11/18/20 0001       Assessment   Medical Diagnosis M54.42.left side low back pain with sciatica, M43.16 arterolisthesis of lumbar spine    Referring Provider (PT) Magnus Sinning MD      Standardized Balance Assessment   Standardized Balance Assessment Berg Balance Test;Timed Up and Go Test      Berg Balance Test   Sit to Stand Able to stand without using hands and stabilize independently    Standing Unsupported Able to stand safely 2 minutes    Sitting with Back Unsupported but Feet Supported on Floor or Stool Able to sit safely and securely 2 minutes    Stand to Sit  Controls descent by using hands    Transfers Able to transfer safely, definite need of hands    Standing Unsupported with Eyes Closed Able to stand 10 seconds with supervision    Standing Unsupported with Feet Together Able to place feet together independently and stand for 1 minute with supervision    From Standing, Reach Forward with Outstretched Arm Can reach forward >5 cm safely (2")    From Standing Position, Pick up Object from Floor Unable to pick up shoe, but reaches 2-5 cm (1-2") from shoe and balances independently    From Standing Position, Turn to Look Behind Over each Shoulder Turn sideways only but maintains balance    Turn 360 Degrees Able to turn 360 degrees safely but slowly    Standing Unsupported, Alternately Place Feet on Step/Stool Able to complete >2 steps/needs minimal assist    Standing Unsupported, One Foot in Front Able to take small step independently and hold 30 seconds    Standing on One Leg Unable to try or needs assist to prevent fall    Total Score 35    Berg comment: instructions given multiple times due to pt's HOH      Timed Up and Go Test   Normal TUG (seconds) 16    TUG Comments no device                           [facility] Adult PT Treatment/Exercise - 11/18/20 0001       Exercises   Exercises Lumbar      Lumbar Exercises: Stretches   Single Knee to Chest Stretch 10 seconds    Pelvic Tilt 10 reps    Piriformis Stretch 20 seconds;3 reps      Lumbar Exercises: Aerobic   Nustep L3 x 6 minutes      Lumbar Exercises: Standing   Heel Raises 10 reps    Other Standing Lumbar Exercises hip abduciton and hip flexion each x10      Lumbar Exercises: Seated   Other Seated Lumbar Exercises forward trunk flexion over green physioball x 10 holding 5 seconds each at end range    Other Seated Lumbar Exercises cervical retraction using towel for biofeedback holding 5 seconds x 10      Lumbar Exercises: Supine   Ab Set 10 reps;5 seconds     Pelvic Tilt 10  reps;5 seconds    Bridge 10 reps;5 seconds    Straight Leg Raise 10 reps                       PT Short Term Goals - 11/11/20 1426       PT SHORT TERM GOAL #1   Title Pt will be independent in her initial HEP.    Baseline 3    Period Weeks    Status New    Target Date 12/06/20      PT SHORT TERM GOAL #2   Title Pt will improve her 5 time sit to stand to </= 18 seconds    Baseline 26 seconds    Time 4    Period Weeks    Status New    Target Date 12/13/20               PT Long Term Goals - 11/18/20 1147       PT LONG TERM GOAL #1   Title Pt will be indpendent in her advanced HEP.    Status On-going      PT LONG TERM GOAL #2   Title Pt will be able to report decreased pain with ADL's of </= 2/10.    Status On-going      PT LONG TERM GOAL #3   Title Pt will be able to pick up object from floor safely with back pain of </= 3/10.    Status On-going      PT LONG TERM GOAL #4   Title Pt will improve her FOTO from 51% to >/= 56%.    Status On-going      PT LONG TERM GOAL #5   Title Pt will be able to stand for >/= 20 minutes with pain </= 2/10 for preparing meals.    Status On-going      Additional Long Term Goals   Additional Long Term Goals Yes      PT LONG TERM GOAL #6   Title Pt will improve her BERG balance score to >/= 42/56.    Baseline 35/56 on 11/18/2020    Time 5    Period Weeks    Status New    Target Date 12/27/20                   Plan - 11/18/20 1115     Clinical Impression Statement Pt tolerating gentle spinal exercises for cervical, throacic and lumbar spine. Pt edu in core stability and strengtheing in sitting and supine. Pt's daughter present during treatement session and reporting pt has been complaining less of pain since beginning exercises last week. Pt with 16 seconds on TUG with no device. Pt 35/56 on BERG. I suggested to pt's daughter to bring her straight cane from home so we could work on gait  training for added balance when on uneven surfaces for safety.  Conitnue skilled PT to maximize pt's function.    Personal Factors and Comorbidities Comorbidity 3+    Comorbidities HOH, peripheral edema, HTN, glaucoma, degenerative disc disease lumbar spine, osteopenia    Examination-Activity Limitations Stand;Transfers;Toileting;Sit;Squat;Stairs;Carry;Lift    Examination-Participation Restrictions Other;Community Activity;Meal Prep    Rehab Potential Fair    PT Frequency 1x / week    PT Duration 6 weeks    PT Treatment/Interventions ADLs/Self Care Home Management;Cryotherapy;Electrical Stimulation;Ultrasound;Gait training;Stair training;Functional mobility training;Therapeutic activities;Therapeutic exercise;Balance training;Neuromuscular re-education;Cognitive remediation;Patient/family education;Taping;Passive range of motion;Dry needling;Manual techniques;Moist Heat    PT Next Visit Plan Nustep,  lumbar stretching, LE hip strenggthening, sit to stand, functional mobility    PT Home Exercise Plan Signed   Access Code: [email or web address]  URL: [email or web address]  Date: 11/11/2020  Prepared by: Kearney Hard     Exercises  Supine Lower Trunk Rotation - 2 x daily - 7 x weekly - 3 reps - 10-20 seconds hold  Supine Bridge - 2 x daily - 7 x weekly - 2 sets - 10 reps - 5 seconds hold  Hooklying Single Knee to Chest Stretch - 2 x daily - 7 x weekly - 3 reps - 10-20 seconds hold  Supine Figure 4 Piriformis Stretch - 2 x daily - 7 x weekly - 3 reps - 10-20- seconds hold  Supine Hamstring Stretch with Strap - 2 x daily - 7 x weekly - 3 reps - 10-20 seconds hold          Last Modified by Oretha Caprice, PT on 11/11/2020 at  2:22 PM    Consulted and Agree with Plan of Care Patient;Family member/caregiver    Family Member Consulted daughter             Patient will benefit from skilled therapeutic intervention in order to improve the following deficits and impairments:  Pain, Difficulty walking,  Impaired flexibility, Decreased balance, Decreased activity tolerance, Decreased range of motion, Decreased strength, Postural dysfunction  Visit Diagnosis: Chronic left-sided low back pain with left-sided sciatica  Muscle weakness (generalized)  Difficulty in walking, not elsewhere classified     Problem List Patient Active Problem List   Diagnosis Date Noted   Hearing impaired 05/03/2019   Osteoarthritis of both knees 08/26/2016   Osteoarthritis, hand 08/26/2016   Peripheral edema 05/22/2014   Meningioma ([place]) 04/29/2012   Vestibular schwannoma ([place]) 03/26/2012   Vitamin D deficiency 09/24/2009   Vitamin B12 deficiency 07/05/2009   OSTEOPENIA 10/16/2008   Essential hypertension, benign 09/05/2008   MELANOMA, HX OF 09/05/2008   ISCHEMIC COLITIS, HX OF 05/26/2007   Unspecified glaucoma 04/21/2006   FEMALE STRESS INCONTINENCE 04/21/2006   DEGENERATIVE [place] DISEASE, LUMBAR SPINE 04/21/2006   DIVERTICULOSIS, COLON [place] 12/01/2005    Oretha Caprice, PT, MPT 11/18/2020, 11:48 AM  [facility] Therapy [address] [place], [place], [address] Phone: [phone]   Fax:  [phone]  Name: Debra [name] MRN: [id number] Date of Birth: [birth year]

## 2020-11-26 ENCOUNTER — Encounter: Payer: PPO | Admitting: Physical Therapy

## 2020-12-03 ENCOUNTER — Ambulatory Visit: Payer: PPO | Admitting: Physical Therapy

## 2020-12-03 ENCOUNTER — Other Ambulatory Visit: Payer: Self-pay

## 2020-12-03 ENCOUNTER — Encounter: Payer: Self-pay | Admitting: Physical Therapy

## 2020-12-03 DIAGNOSIS — M5442 Lumbago with sciatica, left side: Secondary | ICD-10-CM | POA: Diagnosis not present

## 2020-12-03 DIAGNOSIS — G8929 Other chronic pain: Secondary | ICD-10-CM | POA: Diagnosis not present

## 2020-12-03 DIAGNOSIS — M6281 Muscle weakness (generalized): Secondary | ICD-10-CM

## 2020-12-03 DIAGNOSIS — R262 Difficulty in walking, not elsewhere classified: Secondary | ICD-10-CM | POA: Diagnosis not present

## 2020-12-03 NOTE — Therapy (Signed)
[facility] [address] [place], [place], [address] Phone: [phone]   Fax:  [phone]  Physical Therapy Treatment  Patient Details  Name: Debra [name] MRN: [id number] Date of Birth: Nov 28, 1934 Referring Provider (PT): Magnus Sinning MD   Encounter Date: 12/03/2020   PT End of Session - 12/03/20 1314     Visit Number 3    Number of Visits 8    Date for PT Re-Evaluation 12/27/20    Authorization Type Pt stating 2x/ week was too much for her therefore pt's frequency was decreased to 1x/ week for 6 weeks.    PT Start Time 1309    PT Stop Time 1345    PT Time Calculation (min) 36 min    Activity Tolerance Patient tolerated treatment well    Behavior During Therapy WFL for tasks assessed/performed             Past Medical History:  Diagnosis Date   Arthritis    Phreesia 07/02/2020   Brain mass    monitor by Dr Trenton Gammon - right posterior fossa meningioma - thinks it's benign per patient   Cancer ([place])    hand, face - spots removed   Constipation    Degeneration of lumbar or lumbosacral intervertebral disc    arthritis in back, hips and hands - otc med prn   Diarrhea    Diverticulosis of colon with hemorrhage    Dysrhythmia    Hx irregular heart beat - 15 yrs ago   Female stress incontinence    Glaucoma    Phreesia 07/02/2020   Headache    otc med prn   Hypertension    Lumbago    Missed abortion    x 2 - no surgery required   Paroxysmal supraventricular tachycardia (HCC)    Hx   PONV (postoperative nausea and vomiting)    Pure hypercholesterolemia    SVD (spontaneous vaginal delivery)    x 4   Unspecified glaucoma(365.9)    bilateral    Past Surgical History:  Procedure Laterality Date   ABDOMINAL HYSTERECTOMY     APPENDECTOMY     COLONOSCOPY     EYE SURGERY     bilateral cataract eye surgery   EYE SURGERY     laser to relieve eye pressure - bilateral   LAPAROTOMY N/A 02/06/2014   Procedure: EXPLORATORY  LAPAROTOMY;  Surgeon: Lavonia Drafts, MD;  Location: [facility];  Service: Gynecology;  Laterality: N/A;   MULTIPLE TOOTH EXTRACTIONS     upper   right hand surgery     ganglion cyst removed   TUBAL LIGATION      There were no vitals filed for this visit.   Subjective Assessment - 12/03/20 1312     Subjective Pt reporting back pain today of 3/10. Pt stating she feels like her injection almost 4 weeks ago is helping some. Pt also stating that the Dr took her off steriods.    Patient is accompained by: Family member    Limitations House hold activities;Other (comment);Lifting;Walking    Diagnostic tests Images:1. Advanced facet hypertrophy and grade 2 anterolisthesis at L4-5  with moderate central and foraminal narrowing bilaterally, slightly  worse on the right.  2. Moderate left and mild right foraminal stenosis at L2-3 secondary  to a broad-based disc protrusion and moderate facet hypertrophy  bilaterally.  3. Mild foraminal narrowing bilaterally at L3-4 is worse on the  left.  4. Moderate facet hypertrophy at L5-S1 is worse on the right.  There  is no significant disc protrusion or stenosis.    Patient Stated Goals move better    Currently in Pain? Yes    Pain Score 3     Pain Orientation Lower;Left    Pain Type Chronic pain    Pain Onset More than a month ago                [facility] PT Assessment - 12/03/20 0001       Assessment   Medical Diagnosis M54.42.left side low back pain with sciatica, M43.16 arterolisthesis of lumbar spine    Referring Provider (PT) Magnus Sinning MD                           [facility] Adult PT Treatment/Exercise - 12/03/20 0001       Ambulation/Gait   Gait Comments Pt's straight cane adjusted to pt's height. Performed 3 point gait training with st cane      Exercises   Exercises Lumbar      Lumbar Exercises: Stretches   Lower Trunk Rotation 3 reps;20 seconds      Lumbar Exercises: Aerobic   Nustep L4 x 6 minutes       Lumbar Exercises: Standing   Heel Raises 10 reps    Row Strengthening;Both;15 reps    Theraband Level (Row) Level 2 (Red)    Other Standing Lumbar Exercises hip extension x 15 with UE support      Lumbar Exercises: Seated   Long Arc Quad on Chair Strengthening;Both;10 reps;2 sets    LAQ on Chair Weights (lbs) 2      Lumbar Exercises: Supine   Ab Set 10 reps;5 seconds    Bridge --    Straight Leg Raise 10 reps    Other Supine Lumbar Exercises --                       PT Short Term Goals - 12/03/20 1351       PT SHORT TERM GOAL #1   Title Pt will be independent in her initial HEP.    Status Achieved      PT SHORT TERM GOAL #2   Title Pt will improve her 5 time sit to stand to </= 18 seconds    Status On-going               PT Long Term Goals - 12/03/20 1352       PT LONG TERM GOAL #1   Title Pt will be indpendent in her advanced HEP.    Status On-going      PT LONG TERM GOAL #2   Title Pt will be able to report decreased pain with ADL's of </= 2/10.    Status On-going      PT LONG TERM GOAL #3   Title Pt will be able to pick up object from floor safely with back pain of </= 3/10.    Status On-going      PT LONG TERM GOAL #4   Title Pt will improve her FOTO from 51% to >/= 56%.    Status On-going      PT LONG TERM GOAL #5   Title Pt will be able to stand for >/= 20 minutes with pain </= 2/10 for preparing meals.    Status On-going      PT LONG TERM GOAL #6   Title Pt will improve her BERG balance score to >/=  42/56.    Status On-going                   Plan - 12/03/20 1329     Clinical Impression Statement Pt arriving today reporting 3/10 low back pain. Pt also arriving with straight cane. Pt's cane was adjusted and lowered to correct height. Pt trained in 3 point gait and able to return demonstration. Pt reporting today the bridging exercise makes her pain worse. Pt was instructed to delete it from her HEP and HEp was updated.  Pt  tolerating all other exercises well. Continue skilled PT to maximize pt's function.    Personal Factors and Comorbidities Comorbidity 3+    Comorbidities HOH, peripheral edema, HTN, glaucoma, degenerative disc disease lumbar spine, osteopenia    Examination-Activity Limitations Stand;Transfers;Toileting;Sit;Squat;Stairs;Carry;Lift    Examination-Participation Restrictions Other;Community Activity;Meal Prep    Stability/Clinical Decision Making Stable/Uncomplicated    Rehab Potential Fair    PT Frequency 1x / week    PT Duration 6 weeks    PT Treatment/Interventions ADLs/Self Care Home Management;Cryotherapy;Electrical Stimulation;Ultrasound;Gait training;Stair training;Functional mobility training;Therapeutic activities;Therapeutic exercise;Balance training;Neuromuscular re-education;Cognitive remediation;Patient/family education;Taping;Passive range of motion;Dry needling;Manual techniques;Moist Heat    PT Next Visit Plan Nustep, lumbar stretching, LE hip strenggthening, sit to stand, functional mobility    PT Home Exercise Plan Signed   Access Code: [email or web address]  URL: [email or web address]  Date: 11/11/2020  Prepared by: Kearney Hard     Exercises  Supine Lower Trunk Rotation - 2 x daily - 7 x weekly - 3 reps - 10-20 seconds hold  Supine Bridge - 2 x daily - 7 x weekly - 2 sets - 10 reps - 5 seconds hold  Hooklying Single Knee to Chest Stretch - 2 x daily - 7 x weekly - 3 reps - 10-20 seconds hold  Supine Figure 4 Piriformis Stretch - 2 x daily - 7 x weekly - 3 reps - 10-20- seconds hold  Supine Hamstring Stretch with Strap - 2 x daily - 7 x weekly - 3 reps - 10-20 seconds hold          Last Modified by Oretha Caprice, PT on 11/11/2020 at  2:22 PM    Consulted and Agree with Plan of Care Patient;Family member/caregiver    Family Member Consulted daughter             Patient will benefit from skilled therapeutic intervention in order to improve the following deficits and  impairments:  Pain, Difficulty walking, Impaired flexibility, Decreased balance, Decreased activity tolerance, Decreased range of motion, Decreased strength, Postural dysfunction  Visit Diagnosis: Chronic left-sided low back pain with left-sided sciatica  Muscle weakness (generalized)  Difficulty in walking, not elsewhere classified     Problem List Patient Active Problem List   Diagnosis Date Noted   Hearing impaired 05/03/2019   Osteoarthritis of both knees 08/26/2016   Osteoarthritis, hand 08/26/2016   Peripheral edema 05/22/2014   Meningioma ([place]) 04/29/2012   Vestibular schwannoma ([place]) 03/26/2012   Vitamin D deficiency 09/24/2009   Vitamin B12 deficiency 07/05/2009   OSTEOPENIA 10/16/2008   Essential hypertension, benign 09/05/2008   MELANOMA, HX OF 09/05/2008   ISCHEMIC COLITIS, HX OF 05/26/2007   Unspecified glaucoma 04/21/2006   FEMALE STRESS INCONTINENCE 04/21/2006   DEGENERATIVE [place] DISEASE, LUMBAR SPINE 04/21/2006   DIVERTICULOSIS, COLON [place] 12/01/2005    Oretha Caprice, PT, MPT 12/03/2020, 1:53 PM  [facility] [address] [place], [place], [address] Phone: [phone]   Fax:  [phone]  Name: Debra [name] MRN: [id number] Date of Birth: [birth year]

## 2020-12-09 ENCOUNTER — Ambulatory Visit: Payer: PPO | Admitting: Physical Therapy

## 2020-12-09 ENCOUNTER — Encounter: Payer: Self-pay | Admitting: Physical Therapy

## 2020-12-09 ENCOUNTER — Other Ambulatory Visit: Payer: Self-pay

## 2020-12-09 DIAGNOSIS — M6281 Muscle weakness (generalized): Secondary | ICD-10-CM | POA: Diagnosis not present

## 2020-12-09 DIAGNOSIS — G8929 Other chronic pain: Secondary | ICD-10-CM

## 2020-12-09 DIAGNOSIS — M5442 Lumbago with sciatica, left side: Secondary | ICD-10-CM

## 2020-12-09 DIAGNOSIS — R262 Difficulty in walking, not elsewhere classified: Secondary | ICD-10-CM | POA: Diagnosis not present

## 2020-12-09 NOTE — Therapy (Signed)
[facility] [address] [place], [place], [address] Phone: [phone]   Fax:  [phone]  Physical Therapy Treatment  Patient Details  Name: Debra [name] MRN: [id number] Date of Birth: Jun 18, 1934 Referring Provider (PT): Magnus Sinning MD   Encounter Date: 12/09/2020   PT End of Session - 12/09/20 1126     Visit Number 4    Number of Visits 8    Date for PT Re-Evaluation 12/27/20    Authorization Type Pt stating 2x/ week was too much for her therefore pt's frequency was decreased to 1x/ week for 6 weeks.    PT Start Time 1100    PT Stop Time 1140    PT Time Calculation (min) 40 min    Activity Tolerance Patient tolerated treatment well    Behavior During Therapy WFL for tasks assessed/performed             Past Medical History:  Diagnosis Date   Arthritis    Phreesia 07/02/2020   Brain mass    monitor by Dr Trenton Gammon - right posterior fossa meningioma - thinks it's benign per patient   Cancer ([place])    hand, face - spots removed   Constipation    Degeneration of lumbar or lumbosacral intervertebral disc    arthritis in back, hips and hands - otc med prn   Diarrhea    Diverticulosis of colon with hemorrhage    Dysrhythmia    Hx irregular heart beat - 15 yrs ago   Female stress incontinence    Glaucoma    Phreesia 07/02/2020   Headache    otc med prn   Hypertension    Lumbago    Missed abortion    x 2 - no surgery required   Paroxysmal supraventricular tachycardia (HCC)    Hx   PONV (postoperative nausea and vomiting)    Pure hypercholesterolemia    SVD (spontaneous vaginal delivery)    x 4   Unspecified glaucoma(365.9)    bilateral    Past Surgical History:  Procedure Laterality Date   ABDOMINAL HYSTERECTOMY     APPENDECTOMY     COLONOSCOPY     EYE SURGERY     bilateral cataract eye surgery   EYE SURGERY     laser to relieve eye pressure - bilateral   LAPAROTOMY N/A 02/06/2014   Procedure: EXPLORATORY  LAPAROTOMY;  Surgeon: Lavonia Drafts, MD;  Location: [facility];  Service: Gynecology;  Laterality: N/A;   MULTIPLE TOOTH EXTRACTIONS     upper   right hand surgery     ganglion cyst removed   TUBAL LIGATION      There were no vitals filed for this visit.   Subjective Assessment - 12/09/20 1123     Subjective Pt arriving today reporting 5-6/10 pain in her low back.    Patient is accompained by: Family member    Limitations House hold activities;Other (comment);Lifting;Walking    Diagnostic tests Images:1. Advanced facet hypertrophy and grade 2 anterolisthesis at L4-5  with moderate central and foraminal narrowing bilaterally, slightly  worse on the right.  2. Moderate left and mild right foraminal stenosis at L2-3 secondary  to a broad-based disc protrusion and moderate facet hypertrophy  bilaterally.  3. Mild foraminal narrowing bilaterally at L3-4 is worse on the  left.  4. Moderate facet hypertrophy at L5-S1 is worse on the right. There  is no significant disc protrusion or stenosis.    Currently in Pain? Yes    Pain Score  6     Pain Orientation Lower    Pain Descriptors / Indicators Aching    Pain Type Chronic pain    Pain Onset More than a month ago                               [facility] Adult PT Treatment/Exercise - 12/09/20 0001       Exercises   Exercises Lumbar      Lumbar Exercises: Stretches   Lower Trunk Rotation 3 reps;20 seconds      Lumbar Exercises: Aerobic   Nustep L4 x 6 minutes      Lumbar Exercises: Standing   Heel Raises 15 reps    Row Strengthening;Both;15 reps    Theraband Level (Row) Level 2 (Red)    Other Standing Lumbar Exercises hip extension and hip flexionx 15 with UE support    Other Standing Lumbar Exercises hamstring curls x 15 each LE with UE support      Lumbar Exercises: Seated   Long Arc Quad on Chair Strengthening;Both;10 reps;2 sets    LAQ on Chair Weights (lbs) 3    Sit to Stand 15 reps   UE support from mat at  20 inches     Lumbar Exercises: Supine   Straight Leg Raise 10 reps    Other Supine Lumbar Exercises PPT with verbal cues x 15 holding 5 seconds each                       PT Short Term Goals - 12/03/20 1351       PT SHORT TERM GOAL #1   Title Pt will be independent in her initial HEP.    Status Achieved      PT SHORT TERM GOAL #2   Title Pt will improve her 5 time sit to stand to </= 18 seconds    Status On-going               PT Long Term Goals - 12/03/20 1352       PT LONG TERM GOAL #1   Title Pt will be indpendent in her advanced HEP.    Status On-going      PT LONG TERM GOAL #2   Title Pt will be able to report decreased pain with ADL's of </= 2/10.    Status On-going      PT LONG TERM GOAL #3   Title Pt will be able to pick up object from floor safely with back pain of </= 3/10.    Status On-going      PT LONG TERM GOAL #4   Title Pt will improve her FOTO from 51% to >/= 56%.    Status On-going      PT LONG TERM GOAL #5   Title Pt will be able to stand for >/= 20 minutes with pain </= 2/10 for preparing meals.    Status On-going      PT LONG TERM GOAL #6   Title Pt will improve her BERG balance score to >/= 42/56.    Status On-going                   Plan - 12/09/20 1127     Clinical Impression Statement Pt arriving today reporting 6/10 low back pain. Pt amb with st cane on community distances. Pt's daughter stating, "i can really see a difference at home with how she  is doing" since beginning therapy. Pt tolerating all exercises for lumbar stretching, strengthening, standing balance and functional mobility. Continue skilled PT as pt tolerates.    Personal Factors and Comorbidities Comorbidity 3+    Comorbidities HOH, peripheral edema, HTN, glaucoma, degenerative disc disease lumbar spine, osteopenia    Examination-Activity Limitations Stand;Transfers;Toileting;Sit;Squat;Stairs;Carry;Lift    Examination-Participation  Restrictions Other;Community Activity;Meal Prep    Stability/Clinical Decision Making Stable/Uncomplicated    Rehab Potential Fair    PT Frequency 1x / week    PT Duration 6 weeks    PT Treatment/Interventions ADLs/Self Care Home Management;Cryotherapy;Electrical Stimulation;Ultrasound;Gait training;Stair training;Functional mobility training;Therapeutic activities;Therapeutic exercise;Balance training;Neuromuscular re-education;Cognitive remediation;Patient/family education;Taping;Passive range of motion;Dry needling;Manual techniques;Moist Heat    PT Next Visit Plan Nustep, lumbar stretching, LE hip strenggthening, sit to stand, functional mobility    PT Home Exercise Plan Signed   Access Code: [email or web address]  URL: [email or web address]  Date: 11/11/2020  Prepared by: Kearney Hard     Exercises  Supine Lower Trunk Rotation - 2 x daily - 7 x weekly - 3 reps - 10-20 seconds hold  Supine Bridge - 2 x daily - 7 x weekly - 2 sets - 10 reps - 5 seconds hold  Hooklying Single Knee to Chest Stretch - 2 x daily - 7 x weekly - 3 reps - 10-20 seconds hold  Supine Figure 4 Piriformis Stretch - 2 x daily - 7 x weekly - 3 reps - 10-20- seconds hold  Supine Hamstring Stretch with Strap - 2 x daily - 7 x weekly - 3 reps - 10-20 seconds hold          Last Modified by Oretha Caprice, PT on 11/11/2020 at  2:22 PM    Consulted and Agree with Plan of Care Patient;Family member/caregiver    Family Member Consulted daughter             Patient will benefit from skilled therapeutic intervention in order to improve the following deficits and impairments:  Pain, Difficulty walking, Impaired flexibility, Decreased balance, Decreased activity tolerance, Decreased range of motion, Decreased strength, Postural dysfunction  Visit Diagnosis: Chronic left-sided low back pain with left-sided sciatica  Muscle weakness (generalized)  Difficulty in walking, not elsewhere classified     Problem List Patient  Active Problem List   Diagnosis Date Noted   Hearing impaired 05/03/2019   Osteoarthritis of both knees 08/26/2016   Osteoarthritis, hand 08/26/2016   Peripheral edema 05/22/2014   Meningioma ([place]) 04/29/2012   Vestibular schwannoma ([place]) 03/26/2012   Vitamin D deficiency 09/24/2009   Vitamin B12 deficiency 07/05/2009   OSTEOPENIA 10/16/2008   Essential hypertension, benign 09/05/2008   MELANOMA, HX OF 09/05/2008   ISCHEMIC COLITIS, HX OF 05/26/2007   Unspecified glaucoma 04/21/2006   FEMALE STRESS INCONTINENCE 04/21/2006   DEGENERATIVE [place] DISEASE, LUMBAR SPINE 04/21/2006   DIVERTICULOSIS, COLON [place] 12/01/2005    Oretha Caprice, PT, MPT 12/09/2020, 11:46 AM  [facility] [address] [place], [place], [address] Phone: [phone]   Fax:  [phone]  Name: Debra [name] MRN: [id number] Date of Birth: October 21, 1934

## 2020-12-10 NOTE — Progress Notes (Signed)
Office Visit Note  Patient: Debra [name]             Date of Birth: 06-24-1934           MRN: [id number]             PCP: Susy Frizzle, MD Referring: Magnus Sinning, MD Visit Date: 12/11/2020  Subjective:  New Patient (Initial Visit) (Back pain, see MRI results, fingers locking)   History of Present Illness: Debra STUEVE is a 86 y.o. female here for pain in her neck and shoulders with concern for PMR or myofascial pain initially responsive to prednisone.  This neck and bilateral shoulder pain started sometime in late July or early August.  She had pain and stiffness lasting the entire day before she started treatment.  She could not raise her shoulders without severe pain such as to drink or dress herself. No problems at the elbows or hands. She has some chronic, mild lower extremity edema treated intermittently with lasix not in particular exacerbation. This was a new complaint with relatively acute onset. Initial trial of oral prednisone had a significant benefit but symptoms quickly returned within about 2 weeks after stopping the medicine abruptly.  She also has chronic low back pain but this worsened with increased right-sided sciatica features. Recent MRI demonstrated degenerative appearing arthritis with some spondylolisthesis and face arthropathy. Treatment for symptoms was resumed with oral prednisone 20 mg and tapered down this dose stepwise now down to 5 mg daily at this time.  Local steroid injection in the back and initial treatments with physical therapy have improved her low back symptoms significantly.  Today she is feeling pretty well with very little shoulder pain or stiffness.  Laboratory work-up did demonstrate a mildly elevated CRP despite initial steroid treatment also positive ANA test.  Labs reviewed 10/2020 ANA 1:320 centromere RF neg CRP 12.4 CMP eGFR 58 HLA-B27 neg  11/02/20 MRI Lumbar spine IMPRESSION: 1. Advanced facet hypertrophy and grade 2  anterolisthesis at L4-5 with moderate central and foraminal narrowing bilaterally, slightly worse on the right. 2. Moderate left and mild right foraminal stenosis at L2-3 secondary to a broad-based disc protrusion and moderate facet hypertrophy bilaterally. 3. Mild foraminal narrowing bilaterally at L3-4 is worse on the left. 4. Moderate facet hypertrophy at L5-S1 is worse on the right. There is no significant disc protrusion or stenosis.   Activities of Daily Living:  Patient reports morning stiffness for 30 minutes.   Patient Reports nocturnal pain.  Difficulty dressing/grooming: Denies Difficulty climbing stairs: Denies Difficulty getting out of chair: Denies Difficulty using hands for taps, buttons, cutlery, and/or writing: Denies  Review of Systems  Constitutional:  Positive for fatigue.  HENT:  Negative for mouth dryness.   Eyes:  Positive for dryness.  Respiratory:  Negative for shortness of breath.   Cardiovascular:  Positive for swelling in legs/feet.  Gastrointestinal:  Positive for constipation and diarrhea.  Endocrine: Negative for excessive thirst.  Genitourinary:  Negative for difficulty urinating.  Musculoskeletal:  Positive for joint pain, gait problem, joint pain, joint swelling and morning stiffness.  Skin:  Negative for rash.  Allergic/Immunologic: Negative for susceptible to infections.  Neurological:  Negative for numbness.  Hematological:  Negative for bruising/bleeding tendency.  Psychiatric/Behavioral:  Negative for sleep disturbance.    PMFS History:  Patient Active Problem List   Diagnosis Date Noted   Polymyalgia rheumatica ([place]) 12/11/2020   Positive ANA (antinuclear antibody) 12/11/2020   Hearing impaired 05/03/2019   Osteoarthritis  of both knees 08/26/2016   Osteoarthritis, hand 08/26/2016   Peripheral edema 05/22/2014   Meningioma ([place]) 04/29/2012   Vestibular schwannoma ([place]) 03/26/2012   Vitamin D deficiency 09/24/2009   Vitamin B12  deficiency 07/05/2009   OSTEOPENIA 10/16/2008   Essential hypertension, benign 09/05/2008   MELANOMA, HX OF 09/05/2008   ISCHEMIC COLITIS, HX OF 05/26/2007   Unspecified glaucoma 04/21/2006   FEMALE STRESS INCONTINENCE 04/21/2006   DEGENERATIVE DISC DISEASE, LUMBAR SPINE 04/21/2006   DIVERTICULOSIS, COLON W/HEM 12/01/2005    Past Medical History:  Diagnosis Date   Arthritis    Phreesia 07/02/2020   Brain mass    monitor by Dr Trenton Gammon - right posterior fossa meningioma - thinks it's benign per patient   Cancer ([place])    hand, face - spots removed   Constipation    Degeneration of lumbar or lumbosacral intervertebral disc    arthritis in back, hips and hands - otc med prn   Diarrhea    Diverticulosis of colon with hemorrhage    Dysrhythmia    Hx irregular heart beat - 15 yrs ago   Female stress incontinence    Glaucoma    Phreesia 07/02/2020   Headache    otc med prn   Hypertension    Lumbago    Missed abortion    x 2 - no surgery required   Paroxysmal supraventricular tachycardia (HCC)    Hx   PONV (postoperative nausea and vomiting)    Pure hypercholesterolemia    SVD (spontaneous vaginal delivery)    x 4   Unspecified glaucoma(365.9)    bilateral    Family History  Problem Relation Age of Onset   Aneurysm Mother    Alzheimer's disease Mother    Cancer Father        prostate   Cancer Sister    Arthritis Sister    Cancer Brother        prostate   Heart disease Brother    Hypertension Brother    Glaucoma Brother    Cancer Brother        prostate   Diabetes Brother    Heart disease Brother    Glaucoma Brother    Past Surgical History:  Procedure Laterality Date   ABDOMINAL HYSTERECTOMY     APPENDECTOMY     COLONOSCOPY     EYE SURGERY     bilateral cataract eye surgery   EYE SURGERY     laser to relieve eye pressure - bilateral   LAPAROTOMY N/A 02/06/2014   Procedure: EXPLORATORY LAPAROTOMY;  Surgeon: Lavonia Drafts, MD;  Location: [facility];   Service: Gynecology;  Laterality: N/A;   MULTIPLE TOOTH EXTRACTIONS     upper   right hand surgery     ganglion cyst removed   TUBAL LIGATION     Social History   Social History Narrative    Has living will.  Daughter is HCPOA, Debra [name].   Full code.   Immunization History  Administered Date(s) Administered   Fluad Quad(high Dose 65+) 11/03/2019   Influenza Whole 01/14/2007, 12/14/2007, 03/22/2008, 12/17/2009   Influenza, High Dose Seasonal PF 12/14/2016, 11/26/2017   Influenza,inj,Quad PF,6+ Mos 11/14/2013, 11/23/2014, 11/08/2015, 12/06/2018   Moderna Sars-Covid-2 Vaccination 03/28/2019, 04/25/2019, 12/26/2019   Pneumococcal Polysaccharide-23 01/05/2001   Td 03/02/2004, 05/19/2019   Tdap 05/19/2019   Zoster Recombinat (Shingrix) 05/19/2019, 11/10/2019   Zoster, Live 07/05/2009     Objective: Vital Signs: BP (!) 153/72 (BP Location: Left Arm, Patient Position: Sitting, Cuff Size:  Normal)   Pulse 80   Resp 18   Ht $R'5\' 3"'yt$  (1.6 m)   Wt 165 lb (74.8 kg)   BMI 29.23 kg/m    Physical Exam HENT:     Mouth/Throat:     Mouth: Mucous membranes are moist.     Pharynx: Oropharynx is clear.  Eyes:     Conjunctiva/sclera: Conjunctivae normal.  Cardiovascular:     Rate and Rhythm: Normal rate and regular rhythm.  Pulmonary:     Effort: Pulmonary effort is normal.     Breath sounds: Normal breath sounds.  Musculoskeletal:     Comments: Trace bilateral edema at ankles  Skin:    General: Skin is warm and dry.     Findings: No rash.  Neurological:     Mental Status: She is alert.     Deep Tendon Reflexes: Reflexes normal.  Psychiatric:        Mood and Affect: Mood normal.     Musculoskeletal Exam:  Neck full ROM no tenderness Shoulders full ROM no tenderness or swelling Elbows full ROM no tenderness or swelling Wrists full ROM no tenderness or swelling Fingers with 1st CMC joint arthritis and DIP heberdon's nodes bilaterally, no synovitis No paraspinal tenderness  to palpation over upper and lower back No pain with hip internal and external rotation, decreased ROM with FABER maneuver Knees full ROM no tenderness or swelling Ankles full ROM no tenderness or swelling   Investigation: No additional findings.  Imaging: No results found.  Recent Labs: Lab Results  Component Value Date   WBC 8.6 11/05/2020   HGB 14.0 11/05/2020   PLT 224 11/05/2020   NA 140 11/05/2020   K 4.3 11/05/2020   CL 103 11/05/2020   CO2 29 11/05/2020   GLUCOSE 95 11/05/2020   BUN 18 11/05/2020   CREATININE 0.97 (H) 11/05/2020   BILITOT 0.7 11/05/2020   ALKPHOS 73 08/26/2016   AST 12 11/05/2020   ALT 9 11/05/2020   PROT 6.7 11/05/2020   ALBUMIN 4.1 08/26/2016   CALCIUM 9.5 11/05/2020   GFRAA >90 02/01/2014    Speciality Comments: No specialty comments available.  Procedures:  No procedures performed Allergies: Codeine phosphate, Lipitor [atorvastatin], Pneumococcal vaccines, Sulfa antibiotics, and Latex   Assessment / Plan:     Visit Diagnoses: Positive ANA (antinuclear antibody) - Plan: RNP Antibody, Anti-Smith antibody, Anti-DNA antibody, double-stranded, C3 and C4, C-reactive protein, Serum protein electrophoresis with reflex  Positive ANA at a reasonably high titer but I do not see specific clinical features of disease activity at this time.  Joint symptoms may be masked by the continued 5 mg of prednisone but other history and labs are unimpressive.  We will check ENA's including RNP, Smith, dsDNA, and serum complements.  We will repeat CRP on the decrease prednisone dose.  We will check serum protein electrophoresis.  Polymyalgia rheumatica (HCC)   The describe shoulder pain severe weakness and difficulty with no involvement of peripheral joints does sound consistent for PMR.  I cannot confirm the diagnosis based on my exam her symptoms seem completely improved now compared to what was initially reported.  So far appears to be having success with the  prednisone tapering recommended stopping from this dose would be best incrementally either down to 2.5 or switching to 1 mg tablets for even slower reduction.  I do not see a specific indication for starting DMARD such as methotrexate at this time unless symptoms are returning with additional prednisone tapering.  DEGENERATIVE DISC  DISEASE, LUMBAR SPINE  Symptoms appear well improved after most recent round of injections and continued physical therapy.  Patient reports physical therapy has not been very helpful though her daughter reports thinking has been a significant change for the better.  Orders: Orders Placed This Encounter  Procedures   RNP Antibody   Anti-Smith antibody   Anti-DNA antibody, double-stranded   C3 and C4   C-reactive protein   Serum protein electrophoresis with reflex    No orders of the defined types were placed in this encounter.    Follow-Up Instructions: Return if symptoms worsen or fail to improve.   Collier Salina, MD  Note - This record has been created using [employer].  Chart creation errors have been sought, but may not always  have been located. Such creation errors do not reflect on  the standard of medical care.

## 2020-12-11 ENCOUNTER — Encounter: Payer: Self-pay | Admitting: Internal Medicine

## 2020-12-11 ENCOUNTER — Other Ambulatory Visit: Payer: Self-pay

## 2020-12-11 ENCOUNTER — Ambulatory Visit: Payer: PPO | Admitting: Internal Medicine

## 2020-12-11 VITALS — BP 153/72 | HR 80 | Resp 18 | Ht 63.0 in | Wt 165.0 lb

## 2020-12-11 DIAGNOSIS — M5137 Other intervertebral disc degeneration, lumbosacral region: Secondary | ICD-10-CM | POA: Diagnosis not present

## 2020-12-11 DIAGNOSIS — M353 Polymyalgia rheumatica: Secondary | ICD-10-CM

## 2020-12-11 DIAGNOSIS — R768 Other specified abnormal immunological findings in serum: Secondary | ICD-10-CM | POA: Diagnosis not present

## 2020-12-18 ENCOUNTER — Encounter: Payer: Self-pay | Admitting: Physical Therapy

## 2020-12-18 ENCOUNTER — Other Ambulatory Visit: Payer: Self-pay

## 2020-12-18 ENCOUNTER — Ambulatory Visit: Payer: PPO | Admitting: Physical Therapy

## 2020-12-18 DIAGNOSIS — R262 Difficulty in walking, not elsewhere classified: Secondary | ICD-10-CM | POA: Diagnosis not present

## 2020-12-18 DIAGNOSIS — M6281 Muscle weakness (generalized): Secondary | ICD-10-CM

## 2020-12-18 DIAGNOSIS — M5442 Lumbago with sciatica, left side: Secondary | ICD-10-CM

## 2020-12-18 DIAGNOSIS — G8929 Other chronic pain: Secondary | ICD-10-CM | POA: Diagnosis not present

## 2020-12-18 LAB — PROTEIN ELECTROPHORESIS, SERUM, WITH REFLEX
Albumin ELP: 4.1 g/dL (ref 3.8–4.8)
Alpha 1: 0.4 g/dL — ABNORMAL HIGH (ref 0.2–0.3)
Alpha 2: 0.7 g/dL (ref 0.5–0.9)
Beta 2: 0.3 g/dL (ref 0.2–0.5)
Beta Globulin: 0.4 g/dL (ref 0.4–0.6)
Gamma Globulin: 0.7 g/dL — ABNORMAL LOW (ref 0.8–1.7)
Total Protein: 6.7 g/dL (ref 6.1–8.1)

## 2020-12-18 LAB — C3 AND C4
C3 Complement: 149 mg/dL
C4 Complement: 25 mg/dL

## 2020-12-18 LAB — ANTI-DNA ANTIBODY, DOUBLE-STRANDED: ds DNA Ab: 4 IU/mL

## 2020-12-18 LAB — RNP ANTIBODY: Ribonucleic Protein(ENA) Antibody, IgG: <1 AI

## 2020-12-18 LAB — IFE INTERPRETATION

## 2020-12-18 LAB — C-REACTIVE PROTEIN: CRP: 15.2 mg/L — ABNORMAL HIGH (ref <8.0)

## 2020-12-18 LAB — ANTI-SMITH ANTIBODY: ENA SM Ab Ser-aCnc: <1 AI

## 2020-12-18 NOTE — Therapy (Signed)
[facility] Physical Therapy [address] [place], [place], [address] Phone: [phone]   Fax:  [phone]  Physical Therapy Treatment  Patient Details  Name: Debra [name] MRN: [id number] Date of Birth: Aug 13, 1934 Referring Provider (PT): Magnus Sinning MD   Encounter Date: 12/18/2020   PT End of Session - 12/18/20 1112     Visit Number 5    Number of Visits 8    Date for PT Re-Evaluation 12/27/20    Authorization Type Pt stating 2x/ week was too much for her therefore pt's frequency was decreased to 1x/ week for 6 weeks.    PT Start Time 1105    PT Stop Time 1140    PT Time Calculation (min) 35 min    Activity Tolerance Patient tolerated treatment well             Past Medical History:  Diagnosis Date   Arthritis    Phreesia 07/02/2020   Brain mass    monitor by Dr Trenton Gammon - right posterior fossa meningioma - thinks it's benign per patient   Cancer ([place])    hand, face - spots removed   Constipation    Degeneration of lumbar or lumbosacral intervertebral disc    arthritis in back, hips and hands - otc med prn   Diarrhea    Diverticulosis of colon with hemorrhage    Dysrhythmia    Hx irregular heart beat - 15 yrs ago   Female stress incontinence    Glaucoma    Phreesia 07/02/2020   Headache    otc med prn   Hypertension    Lumbago    Missed abortion    x 2 - no surgery required   Paroxysmal supraventricular tachycardia (HCC)    Hx   PONV (postoperative nausea and vomiting)    Pure hypercholesterolemia    SVD (spontaneous vaginal delivery)    x 4   Unspecified glaucoma(365.9)    bilateral    Past Surgical History:  Procedure Laterality Date   ABDOMINAL HYSTERECTOMY     APPENDECTOMY     COLONOSCOPY     EYE SURGERY     bilateral cataract eye surgery   EYE SURGERY     laser to relieve eye pressure - bilateral   LAPAROTOMY N/A 02/06/2014   Procedure: EXPLORATORY LAPAROTOMY;  Surgeon: Lavonia Drafts, MD;  Location:  [facility];  Service: Gynecology;  Laterality: N/A;   MULTIPLE TOOTH EXTRACTIONS     upper   right hand surgery     ganglion cyst removed   TUBAL LIGATION      There were no vitals filed for this visit.   Subjective Assessment - 12/18/20 1111     Subjective Pt arriving to therapy reporting 3-4/10 pain in her back. Pt states she has been moving her plants around due to the cold weather. She has been bringing them in doors.    Patient is accompained by: Family member    Limitations House hold activities;Other (comment);Lifting;Walking    Diagnostic tests Images:1. Advanced facet hypertrophy and grade 2 anterolisthesis at L4-5  with moderate central and foraminal narrowing bilaterally, slightly  worse on the right.  2. Moderate left and mild right foraminal stenosis at L2-3 secondary  to a broad-based disc protrusion and moderate facet hypertrophy  bilaterally.  3. Mild foraminal narrowing bilaterally at L3-4 is worse on the  left.  4. Moderate facet hypertrophy at L5-S1 is worse on the right. There  is no significant disc protrusion or stenosis.  Patient Stated Goals move better    Currently in Pain? Yes    Pain Score 4     Pain Location Back    Pain Orientation Lower    Pain Descriptors / Indicators Sore    Pain Type Chronic pain    Pain Onset More than a month ago                [facility] PT Assessment - 12/18/20 0001       Assessment   Medical Diagnosis M54.42.left side low back pain with sciatica, M43.16 arterolisthesis of lumbar spine    Referring Provider (PT) Magnus Sinning MD      AROM   AROM Assessment Site Lumbar    Lumbar Flexion 60    Lumbar Extension 25    Lumbar - Right Side Bend 35    Lumbar - Left Side Bend 36    Lumbar - Right Rotation limited 50%    Lumbar - Left Rotation limited 50%      Transfers   Five time sit to stand comments  16 seconds with UE support      Ambulation/Gait   Gait Comments Pt amb with no device on level surfaces.                            [place] Adult PT Treatment/Exercise - 12/18/20 0001       Exercises   Exercises Lumbar      Lumbar Exercises: Stretches   Lower Trunk Rotation 3 reps;20 seconds      Lumbar Exercises: Aerobic   Nustep L5 x 6 minutes      Lumbar Exercises: Machines for Strengthening   Leg Press 50# bilateral LE's 3x10.      Lumbar Exercises: Standing   Row Strengthening;Both;15 reps    Theraband Level (Row) Level 3 (Green)    Row Limitations 2 sets    Other Standing Lumbar Exercises standing marching with cues for core activation on Airex, heel raises on Airex x 15 , hip extension each LE all x 15 with UE support   all with UE support   Other Standing Lumbar Exercises mini squats in parallel bars x 10      Lumbar Exercises: Seated   Long Arc Quad on Chair Strengthening;Both;10 reps;2 sets    LAQ on Chair Weights (lbs) 4, holding 3 seconds each    Sit to Stand 15 reps   UE support from mat at 20 inches     Lumbar Exercises: Supine   Bridge 15 reps;2 seconds    Straight Leg Raise 15 reps                       PT Short Term Goals - 12/18/20 1113       PT SHORT TERM GOAL #1   Title Pt will be independent in her initial HEP.    Status Achieved      PT SHORT TERM GOAL #2   Title Pt will improve her 5 time sit to stand to </= 18 seconds    Baseline 16 seconds    Status Achieved               PT Long Term Goals - 12/18/20 1118       PT LONG TERM GOAL #1   Title Pt will be indpendent in her advanced HEP.    Status On-going      PT LONG TERM  GOAL #2   Title Pt will be able to report decreased pain with ADL's of </= 2/10.    Status On-going      PT LONG TERM GOAL #3   Title Pt will be able to pick up object from floor safely with back pain of </= 3/10.    Status On-going      PT LONG TERM GOAL #4   Title Pt will improve her FOTO from 51% to >/= 56%.    Status On-going      PT LONG TERM GOAL #5   Title Pt will be able to stand for  >/= 20 minutes with pain </= 2/10 for preparing meals.    Status On-going      PT LONG TERM GOAL #6   Title Pt will improve her BERG balance score to >/= 42/56.    Baseline 35/56 on 11/18/2020    Status On-going                   Plan - 12/18/20 1149     Clinical Impression Statement Pt arriving today reporting3-/410 pain in her low back due to working more at home lifting potted plants. Pt and daughter reporting pt is doing better overall with her walking and home mobility. Pt has improved her lumbar ROM and her TUG from 26 to 16 seconds. We will continue to focus on overall strength and balance in her upcoming visits.    Personal Factors and Comorbidities Comorbidity 3+    Comorbidities HOH, peripheral edema, HTN, glaucoma, degenerative disc disease lumbar spine, osteopenia    Examination-Activity Limitations Stand;Transfers;Toileting;Sit;Squat;Stairs;Carry;Lift    Examination-Participation Restrictions Other;Community Activity;Meal Prep    Rehab Potential Fair    PT Frequency 1x / week    PT Duration 6 weeks    PT Treatment/Interventions ADLs/Self Care Home Management;Cryotherapy;Electrical Stimulation;Ultrasound;Gait training;Stair training;Functional mobility training;Therapeutic activities;Therapeutic exercise;Balance training;Neuromuscular re-education;Cognitive remediation;Patient/family education;Taping;Passive range of motion;Dry needling;Manual techniques;Moist Heat    PT Next Visit Plan BERG retest,    Nustep, lumbar stretching, LE hip strenggthening, sit to stand, functional mobility    PT Home Exercise Plan Signed   Access Code: [email or web address]  URL: [email or web address]  Date: 11/11/2020  Prepared by: Kearney Hard     Exercises  Supine Lower Trunk Rotation - 2 x daily - 7 x weekly - 3 reps - 10-20 seconds hold  Supine Bridge - 2 x daily - 7 x weekly - 2 sets - 10 reps - 5 seconds hold  Hooklying Single Knee to Chest Stretch - 2 x daily - 7 x weekly - 3 reps -  10-20 seconds hold  Supine Figure 4 Piriformis Stretch - 2 x daily - 7 x weekly - 3 reps - 10-20- seconds hold  Supine Hamstring Stretch with Strap - 2 x daily - 7 x weekly - 3 reps - 10-20 seconds hold          Last Modified by Oretha Caprice, PT on 11/11/2020 at  2:22 PM    Consulted and Agree with Plan of Care Patient;Family member/caregiver    Family Member Consulted daughter             Patient will benefit from skilled therapeutic intervention in order to improve the following deficits and impairments:  Pain, Difficulty walking, Impaired flexibility, Decreased balance, Decreased activity tolerance, Decreased range of motion, Decreased strength, Postural dysfunction  Visit Diagnosis: Chronic left-sided low back pain with left-sided sciatica  Muscle weakness (generalized)  Difficulty in walking, not elsewhere classified  Problem List Patient Active Problem List   Diagnosis Date Noted   Polymyalgia rheumatica ([place]) 12/11/2020   Positive ANA (antinuclear antibody) 12/11/2020   Hearing impaired 05/03/2019   Osteoarthritis of both knees 08/26/2016   Osteoarthritis, hand 08/26/2016   Peripheral edema 05/22/2014   Meningioma ([place]) 04/29/2012   Vestibular schwannoma ([place]) 03/26/2012   Vitamin D deficiency 09/24/2009   Vitamin B12 deficiency 07/05/2009   OSTEOPENIA 10/16/2008   Essential hypertension, benign 09/05/2008   MELANOMA, HX OF 09/05/2008   ISCHEMIC COLITIS, HX OF 05/26/2007   Unspecified glaucoma 04/21/2006   FEMALE STRESS INCONTINENCE 04/21/2006   DEGENERATIVE [place] DISEASE, LUMBAR SPINE 04/21/2006   DIVERTICULOSIS, COLON [place] 12/01/2005    Oretha Caprice, PT,MPT 12/18/2020, 11:52 AM  [facility] Physical Therapy [address] [place], [place], [address] Phone: [phone]   Fax:  [phone]  Name: Debra [name] MRN: [id number] Date of Birth: January 07, 1935

## 2020-12-20 ENCOUNTER — Telehealth: Payer: Self-pay | Admitting: Pharmacist

## 2020-12-20 ENCOUNTER — Other Ambulatory Visit: Payer: Self-pay | Admitting: Physical Medicine and Rehabilitation

## 2020-12-20 ENCOUNTER — Telehealth: Payer: Self-pay | Admitting: Physical Medicine and Rehabilitation

## 2020-12-20 NOTE — Progress Notes (Signed)
    Chronic Care Management Pharmacy Assistant   Name: LESLEY ATKIN  MRN: [id number] DOB: 1934-09-30  Reason for Encounter: General Disease State Call   Conditions to be addressed/monitored: HTN, Osteopenia, Arthritis, Edema  Recent office visits:  11/05/20 Dr. Dennard Schaumann For follow-up. No medication changes.   Recent consult visits:  12/11/20 Rheumatology Benjamine Mola, Resa Miner, MD. For new patient. No medication changes. 11/01/20 Phys Med Magnus Sinning, MD. For low back pain. STARTED Prednisone 10 mg daily. COMPLETED Prednisone 20 mg  10/23/20 Phys Med Magnus Sinning, MD. For pain. STARTED Prednisone 20 mg daily.  10/15/20 Optometry Lorie Apley T. For Residual stage of open-angle glaucoma, bilateral. No information given.  Hospital visits:  None since 10/04/20  Medications: Outpatient Encounter Medications as of 12/20/2020  Medication Sig Note   acetaminophen (TYLENOL) 325 MG tablet Take 650 mg by mouth every 6 (six) hours as needed.    amLODipine (NORVASC) 10 MG tablet Take 1 tablet (10 mg total) by mouth daily.    atenolol (TENORMIN) 50 MG tablet TAKE 1 TABLET BY MOUTH ONCE DAILY    brimonidine (ALPHAGAN) 0.2 % ophthalmic solution Place 1 drop into both eyes 2 (two) times daily.    cholecalciferol (VITAMIN D3) 25 MCG (1000 UNIT) tablet Take 1,000 Units by mouth daily.    cloNIDine (CATAPRES) 0.1 MG tablet TAKE 1 TABLET BY MOUTH IN THE MORNING AND 2 TABLETS AT BEDTIME    furosemide (LASIX) 40 MG tablet TAKE 1 TABLET BY MOUTH ONCE DAILY AS NEEDED    latanoprost (XALATAN) 0.005 % ophthalmic solution Place 1 drop into both eyes at bedtime.    lisinopril (ZESTRIL) 40 MG tablet TAKE 1 TABLET BY MOUTH ONCE A DAY 07/04/2020: She is taking one-half tablet twice daily   pravastatin (PRAVACHOL) 80 MG tablet Take 1 tablet (80 mg total) by mouth daily.    predniSONE (DELTASONE) 10 MG tablet Take 1 tablet (10 mg total) by mouth daily with breakfast. (Patient taking differently: Take 10 mg by  mouth daily with breakfast. Patient states she is taking 5 mg daily)    timolol (TIMOPTIC) 0.5 % ophthalmic solution Place 1 drop into both eyes 2 (two) times daily.    traMADol (ULTRAM) 50 MG tablet Take 1 tablet (50 mg total) by mouth every 6 (six) hours as needed for severe pain.    vitamin B-12 (CYANOCOBALAMIN) 100 MCG tablet Take 100 mcg by mouth daily.    No facility-administered encounter medications on file as of 12/20/2020.   GEN CALL: Patient stated her mom is doing well with taking her medications. She stated she has been having pain all over her body and she recent had a MRI showing some DDD. Her daughter stated she is getting some injections to help with her joint/body pain. Her daughter stated the prednisone is working well for her when it comes to her pain. She stated she is doing physical therapy and that's helping a lot. She stated her mom appetite has come back since her pain is gone. Juliann Pulse her daughter stated her mom is still active as much as she can daily.  Care Gaps:None.  Star Rating Drugs:Lisinopril 40 mg 12/04/20 90 DS, Pravastatin 80 mg 7/5/2 90 DS.  Follow-Up:Pharmacist Review  Charlann Lange, [facility] Pharmacist Assistant [phone]

## 2020-12-20 NOTE — Telephone Encounter (Signed)
Pt daughter called and wants to know if you can refill pt prednisone.   Cb [phone]

## 2020-12-21 ENCOUNTER — Other Ambulatory Visit: Payer: Self-pay

## 2020-12-23 ENCOUNTER — Other Ambulatory Visit: Payer: Self-pay

## 2020-12-24 ENCOUNTER — Encounter: Payer: PPO | Admitting: Physical Therapy

## 2020-12-24 ENCOUNTER — Telehealth: Payer: Self-pay | Admitting: Physical Medicine and Rehabilitation

## 2020-12-24 NOTE — Telephone Encounter (Signed)
See previous messages.

## 2020-12-24 NOTE — Telephone Encounter (Signed)
Pts daughter Juliann Pulse called back wanting to check on the prednisone rx. She states the pt runs out tomorrow.   [phone]

## 2020-12-24 NOTE — Telephone Encounter (Signed)
Pharmacy called. Pt needs refill for prednisone. She only has on pill for tomorrow.   Cb [phone]

## 2020-12-24 NOTE — Telephone Encounter (Signed)
Please advise 

## 2020-12-24 NOTE — Telephone Encounter (Signed)
Called patient's daughter to advise to call Dr. Marveen Reeks office tomorrow morning to discuss taper.

## 2020-12-26 ENCOUNTER — Telehealth: Payer: Self-pay | Admitting: *Deleted

## 2020-12-26 MED ORDER — PREDNISONE 5 MG PO TABS: ORAL_TABLET | ORAL | 0 refills | Status: DC

## 2020-12-26 NOTE — Telephone Encounter (Signed)
Received call from patient daughter, Juliann Pulse [phone] telephone.   Reports that she and patient discussed prednisone with PCP. States that she is currently taking 5mg  PO QD. States that she has been taking the 5mg  x3 weeks. Requested refill from PCP.   Chart notes from 11/05/2020: I have recommended that we gradually wean her off prednisone slowly.At the end of this month I would step her down to 7.5 mg daily if possible and decrease in a similar fashion every 3 to 4 weeks as tolerated.  Depending upon whichever dosage creates an issue, we may elect just to continue her on that dose of prednisone indefinitely given her life expectancy.  However if she is unable to wean off prednisone at all, consider rheumatology for methotrexate.   Please advise.

## 2020-12-26 NOTE — Telephone Encounter (Signed)
Patient daughter made aware. Agreeable to plan. Prescription sent to pharmacy.

## 2020-12-30 ENCOUNTER — Ambulatory Visit: Payer: PPO | Admitting: Physical Therapy

## 2020-12-30 ENCOUNTER — Encounter: Payer: Self-pay | Admitting: Physical Therapy

## 2020-12-30 ENCOUNTER — Other Ambulatory Visit: Payer: Self-pay

## 2020-12-30 DIAGNOSIS — G8929 Other chronic pain: Secondary | ICD-10-CM

## 2020-12-30 DIAGNOSIS — R262 Difficulty in walking, not elsewhere classified: Secondary | ICD-10-CM | POA: Diagnosis not present

## 2020-12-30 DIAGNOSIS — M6281 Muscle weakness (generalized): Secondary | ICD-10-CM | POA: Diagnosis not present

## 2020-12-30 DIAGNOSIS — M5442 Lumbago with sciatica, left side: Secondary | ICD-10-CM | POA: Diagnosis not present

## 2020-12-30 NOTE — Addendum Note (Signed)
Addended by: Kearney Hard R on: 12/30/2020 10:35 AM   Modules accepted: Orders

## 2020-12-30 NOTE — Therapy (Addendum)
[facility] [address] [place], [place], [address] Phone: [phone]   Fax:  [phone]  Physical Therapy Treatment Recert for today's visit Discharge Summary  Patient Details  Name: Debra [name] MRN: [id number] Date of Birth: 07-27-1934 Referring Provider (PT): Magnus Sinning MD   Encounter Date: 12/30/2020   PT End of Session - 12/30/20 0939     Visit Number 6    Number of Visits 8    Date for PT Re-Evaluation 12/27/20    Authorization Type Pt stating 2x/ week was too much for her therefore pt's frequency was decreased to 1x/ week for 6 weeks.    Authorization Time Period recert for today's visit due    PT Start Time 0930    PT Stop Time 1010    PT Time Calculation (min) 40 min    Activity Tolerance Patient tolerated treatment well    Behavior During Therapy WFL for tasks assessed/performed             Past Medical History:  Diagnosis Date   Arthritis    Phreesia 07/02/2020   Brain mass    monitor by Dr Trenton Gammon - right posterior fossa meningioma - thinks it's benign per patient   Cancer ([place])    hand, face - spots removed   Constipation    Degeneration of lumbar or lumbosacral intervertebral disc    arthritis in back, hips and hands - otc med prn   Diarrhea    Diverticulosis of colon with hemorrhage    Dysrhythmia    Hx irregular heart beat - 15 yrs ago   Female stress incontinence    Glaucoma    Phreesia 07/02/2020   Headache    otc med prn   Hypertension    Lumbago    Missed abortion    x 2 - no surgery required   Paroxysmal supraventricular tachycardia (HCC)    Hx   PONV (postoperative nausea and vomiting)    Pure hypercholesterolemia    SVD (spontaneous vaginal delivery)    x 4   Unspecified glaucoma(365.9)    bilateral    Past Surgical History:  Procedure Laterality Date   ABDOMINAL HYSTERECTOMY     APPENDECTOMY     COLONOSCOPY     EYE SURGERY     bilateral cataract eye surgery   EYE  SURGERY     laser to relieve eye pressure - bilateral   LAPAROTOMY N/A 02/06/2014   Procedure: EXPLORATORY LAPAROTOMY;  Surgeon: Lavonia Drafts, MD;  Location: [facility];  Service: Gynecology;  Laterality: N/A;   MULTIPLE TOOTH EXTRACTIONS     upper   right hand surgery     ganglion cyst removed   TUBAL LIGATION      There were no vitals filed for this visit.   Subjective Assessment - 12/30/20 0935     Subjective Pt arriving today feeling a little tired today. No back pain reported today.  Pt reporting pain in her right great toe of 8/10. Pt reporting her pain is due to an ingrown toe nail. Pt 's daughter stated she refuses to go to the doctor about her toes, due to not liking someone attend to her feet.    Patient is accompained by: Family member    Limitations House hold activities;Other (comment);Lifting;Walking    Diagnostic tests Images:1. Advanced facet hypertrophy and grade 2 anterolisthesis at L4-5  with moderate central and foraminal narrowing bilaterally, slightly  worse on the right.  2. Moderate left and mild  right foraminal stenosis at L2-3 secondary  to a broad-based disc protrusion and moderate facet hypertrophy  bilaterally.  3. Mild foraminal narrowing bilaterally at L3-4 is worse on the  left.  4. Moderate facet hypertrophy at L5-S1 is worse on the right. There  is no significant disc protrusion or stenosis.    Patient Stated Goals move better    Currently in Pain? Yes    Pain Score 8     Pain Location Toe (Comment which one)    Pain Orientation Right    Pain Descriptors / Indicators Sore    Pain Type Acute pain    Pain Onset More than a month ago                [facility] PT Assessment - 12/30/20 0001       Assessment   Medical Diagnosis M54.42.left side low back pain with sciatica, M43.16 arterolisthesis of lumbar spine    Referring Provider (PT) Magnus Sinning MD      Observation/Other Assessments   Focus on Therapeutic Outcomes (FOTO)  72%      AROM    AROM Assessment Site Lumbar    Lumbar Flexion 60    Lumbar Extension 25    Lumbar - Right Side Bend 35    Lumbar - Left Side Bend 36    Lumbar - Right Rotation limited 50%    Lumbar - Left Rotation limited 50%      Berg Balance Test   Sit to Stand Able to stand without using hands and stabilize independently    Standing Unsupported Able to stand safely 2 minutes    Sitting with Back Unsupported but Feet Supported on Floor or Stool Able to sit safely and securely 2 minutes    Stand to Sit Sits safely with minimal use of hands    Transfers Able to transfer safely, minor use of hands    Standing Unsupported with Eyes Closed Able to stand 10 seconds safely    Standing Unsupported with Feet Together Able to place feet together independently and stand 1 minute safely    From Standing, Reach Forward with Outstretched Arm Can reach confidently >25 cm (10")    From Standing Position, Pick up Object from Floor Able to pick up shoe safely and easily    From Standing Position, Turn to Look Behind Over each Shoulder Looks behind one side only/other side shows less weight shift    Turn 360 Degrees Able to turn 360 degrees safely one side only in 4 seconds or less    Standing Unsupported, Alternately Place Feet on Step/Stool Able to complete 4 steps without aid or supervision    Standing Unsupported, One Foot in Front Able to take small step independently and hold 30 seconds    Standing on One Leg Able to lift leg independently and hold equal to or more than 3 seconds    Total Score 48      Timed Up and Go Test   Normal TUG (seconds) 13                           [facility] Adult PT Treatment/Exercise - 12/30/20 0001       Exercises   Exercises Lumbar      Lumbar Exercises: Stretches   Lower Trunk Rotation 3 reps;20 seconds      Lumbar Exercises: Standing   Heel Raises 15 reps    Other Standing Lumbar Exercises hip abduction  x 10 with UE support      Lumbar Exercises: Seated    Sit to Stand 15 reps   UE support from mat at 20 inches     Lumbar Exercises: Supine   Bridge 15 reps;2 seconds    Straight Leg Raise 15 reps                       PT Short Term Goals - 12/18/20 1113       PT SHORT TERM GOAL #1   Title Pt will be independent in her initial HEP.    Status Achieved      PT SHORT TERM GOAL #2   Title Pt will improve her 5 time sit to stand to </= 18 seconds    Baseline 16 seconds    Status Achieved               PT Long Term Goals - 12/30/20 0953       PT LONG TERM GOAL #1   Title Pt will be indpendent in her advanced HEP.    Status Achieved      PT LONG TERM GOAL #2   Title Pt will be able to report decreased pain with ADL's of </= 2/10.    Status Achieved      PT LONG TERM GOAL #3   Title Pt will be able to pick up object from floor safely with back pain of </= 3/10.    Baseline no pain reported    Status Achieved      PT LONG TERM GOAL #4   Title Pt will improve her FOTO from 51% to >/= 56%.    Baseline 72%    Status Achieved      PT LONG TERM GOAL #5   Title Pt will be able to stand for >/= 20 minutes with pain </= 2/10 for preparing meals.    Status Achieved      PT LONG TERM GOAL #6   Title Pt will improve her BERG balance score to >/= 42/56.    Baseline 48/56    Status Achieved                   Plan - 12/30/20 0942     Clinical Impression Statement Pt has made great progress with her low back pain. Pt's chief complaint today was from her ingrown toe nail on her right great toe. Pt's BERG score has increased from 35 to 48. Pt's HEP was updated and pt is being discharged from skilled PT services.    Personal Factors and Comorbidities Comorbidity 3+    Comorbidities HOH, peripheral edema, HTN, glaucoma, degenerative disc disease lumbar spine, osteopenia    Examination-Activity Limitations Stand;Transfers;Toileting;Sit;Squat;Stairs;Carry;Lift    Examination-Participation Restrictions  Other;Community Activity;Meal Prep    Stability/Clinical Decision Making Stable/Uncomplicated    Rehab Potential Fair    PT Frequency 1x / week    PT Duration 6 weeks    PT Treatment/Interventions ADLs/Self Care Home Management;Cryotherapy;Electrical Stimulation;Ultrasound;Gait training;Stair training;Functional mobility training;Therapeutic activities;Therapeutic exercise;Balance training;Neuromuscular re-education;Cognitive remediation;Patient/family education;Taping;Passive range of motion;Dry needling;Manual techniques;Moist Heat    PT Next Visit Plan Discharge from skilled PT    PT Home Exercise Plan Access Code: [email or web address]  URL: [email or web address]  Date: 12/30/2020  Prepared by: Kearney Hard    Exercises  Supine Lower Trunk Rotation - 2 x daily - 7 x weekly - 3 reps - 10-20 seconds hold  Hooklying Single Knee to Chest Stretch - 2  x daily - 7 x weekly - 3 reps - 10-20 seconds hold  Supine Figure 4 Piriformis Stretch - 2 x daily - 7 x weekly - 3 reps - 10-20- seconds hold  Supine Hamstring Stretch with Strap - 2 x daily - 7 x weekly - 3 reps - 10-20 seconds hold  Supine Straight Leg Raises - 1-2 x daily - 7 x weekly - 2 sets - 10 reps  Hooklying Clamshell with Resistance - 1-2 x daily - 7 x weekly - 2 sets - 10 reps  Supine Hip Adduction Isometric with Ball - 1-2 x daily - 7 x weekly - 2 sets - 10 reps - 5 seconds hold  Standing Hip Abduction with Counter Support - 2 x daily - 7 x weekly - 2 sets - 10 reps  Heel Raises with Counter Support - 2 x daily - 7 x weekly - 2 sets - 10 reps    Consulted and Agree with Plan of Care Patient;Family member/caregiver    Family Member Consulted daughter             Patient will benefit from skilled therapeutic intervention in order to improve the following deficits and impairments:  Pain, Difficulty walking, Impaired flexibility, Decreased balance, Decreased activity tolerance, Decreased range of motion, Decreased strength, Postural  dysfunction  Visit Diagnosis: Chronic left-sided low back pain with left-sided sciatica  Muscle weakness (generalized)  Difficulty in walking, not elsewhere classified     Problem List Patient Active Problem List   Diagnosis Date Noted   Polymyalgia rheumatica ([place]) 12/11/2020   Positive ANA (antinuclear antibody) 12/11/2020   Hearing impaired 05/03/2019   Osteoarthritis of both knees 08/26/2016   Osteoarthritis, hand 08/26/2016   Peripheral edema 05/22/2014   Meningioma ([place]) 04/29/2012   Vestibular schwannoma ([place]) 03/26/2012   Vitamin D deficiency 09/24/2009   Vitamin B12 deficiency 07/05/2009   OSTEOPENIA 10/16/2008   Essential hypertension, benign 09/05/2008   MELANOMA, HX OF 09/05/2008   ISCHEMIC COLITIS, HX OF 05/26/2007   Unspecified glaucoma 04/21/2006   FEMALE STRESS INCONTINENCE 04/21/2006   DEGENERATIVE DISC DISEASE, LUMBAR SPINE 04/21/2006   DIVERTICULOSIS, COLON W/HEM 12/01/2005  PHYSICAL THERAPY DISCHARGE SUMMARY  Visits from Start of Care: 6  Current functional level related to goals / functional outcomes: See above   Remaining deficits: See above   Education / Equipment: See above   Patient agrees to discharge. Patient goals were met. Patient is being discharged due to meeting the stated rehab goals.   Oretha Caprice, PT, MPT 12/30/2020, 10:18 AM  [facility] [address] [place], [place], [address] Phone: [phone]   Fax:  [phone]  Name: Debra [name] MRN: [id number] Date of Birth: 1934/04/19

## 2021-01-07 ENCOUNTER — Telehealth: Payer: Self-pay | Admitting: *Deleted

## 2021-01-07 MED ORDER — NIRMATRELVIR/RITONAVIR (PAXLOVID) TABLET (RENAL DOSING): 2.0000 | ORAL_TABLET | Freq: Two times a day (BID) | ORAL | 0 refills | Status: AC

## 2021-01-07 NOTE — Telephone Encounter (Signed)
Received call from patient daughter Juliann Pulse.  Reports that patient tested positive for COVID on 01/07/2021 with home test.   Sx began 01/06/2021 and include sore throat, body aches, fever (T Max 101.1), cough, nasal congestion, nausea, and loss of appetite.   Advised to continue symptom management with OTC medications: Tylenol/ Ibuprofen for fever/ body aches, Mucinex/ Delsym for cough/ chest congestion, Afrin/Sudafed/nasal saline for sinus pressure/ nasal congestion.  GFR 58. Order for Paxlovid sent to pharmacy. Advised possible SE include nausea, diarrhea, loss of taste and hypertension. Also discussed Paxlovid rebound.  If chest pain, shortness of breath, fever >104 that is unresponsive to antipyretics noted, or if unable to tolerate fluids, advised to go to ER for evaluation.   Advised that the CDC recommends the following criteria prior to ending isolation in vaccinated and unvaccinated persons: at least 5 days since symptoms onset, then an additional 5 days of masking  AND 3 days fever free without antipyretics (Tylenol or Ibuprofen) AND improvement in respiratory symptoms.

## 2021-01-09 ENCOUNTER — Telehealth: Payer: Self-pay | Admitting: *Deleted

## 2021-01-09 MED ORDER — ONDANSETRON 4 MG PO TBDP
4.0000 mg | ORAL_TABLET | Freq: Three times a day (TID) | ORAL | 0 refills | Status: DC | PRN
Start: 2021-01-09 — End: 2022-05-14

## 2021-01-09 NOTE — Telephone Encounter (Signed)
Received call from patient daughter Juliann Pulse [phone] telephone.   Reports that patient is having severe episodes of diarrhea and is concerned for diverticulitis. Requesting ABTx.   Call placed to patient daughter. States that patient is now having nausea and vomiting. Advised that N/V/D could be due to COVID illness or antiviral medication.   Appointment scheduled for evaluation.   Prescription sent to pharmacy for Zofran.

## 2021-01-10 ENCOUNTER — Ambulatory Visit (INDEPENDENT_AMBULATORY_CARE_PROVIDER_SITE_OTHER): Payer: PPO | Admitting: Family Medicine

## 2021-01-10 ENCOUNTER — Telehealth: Payer: Self-pay

## 2021-01-10 ENCOUNTER — Encounter: Payer: Self-pay | Admitting: Family Medicine

## 2021-01-10 ENCOUNTER — Other Ambulatory Visit: Payer: Self-pay

## 2021-01-10 VITALS — BP 132/88 | HR 80 | Temp 98.8°F | Resp 18

## 2021-01-10 DIAGNOSIS — K5792 Diverticulitis of intestine, part unspecified, without perforation or abscess without bleeding: Secondary | ICD-10-CM

## 2021-01-10 MED ORDER — AMOXICILLIN-POT CLAVULANATE 875-125 MG PO TABS: 1.0000 | ORAL_TABLET | Freq: Two times a day (BID) | ORAL | 0 refills | Status: DC

## 2021-01-10 NOTE — Progress Notes (Signed)
Subjective:    Patient ID: Debra [name], female    DOB: 1934/03/13, 86 y.o.   MRN: [id number]  HPI  Patient was recently diagnosed with COVID and is on day 3 of Paxlovid.  Fortunately she is afebrile and denies any cough or chest pain or shortness of breath or pleurisy.  However she has had 2 days of severe left lower quadrant abdominal pain and copious watery diarrhea.  She has a history of frequent episodes of diverticulitis and believes that this is diverticulitis again.  She states it feels exactly like what she experiences when she has diverticulitis Past Medical History:  Diagnosis Date   Arthritis    Phreesia 07/02/2020   Brain mass    monitor by Dr Trenton Gammon - right posterior fossa meningioma - thinks it's benign per patient   Cancer ([place])    hand, face - spots removed   Constipation    Degeneration of lumbar or lumbosacral intervertebral disc    arthritis in back, hips and hands - otc med prn   Diarrhea    Diverticulosis of colon with hemorrhage    Dysrhythmia    Hx irregular heart beat - 15 yrs ago   Female stress incontinence    Glaucoma    Phreesia 07/02/2020   Headache    otc med prn   Hypertension    Lumbago    Missed abortion    x 2 - no surgery required   Paroxysmal supraventricular tachycardia (HCC)    Hx   PONV (postoperative nausea and vomiting)    Pure hypercholesterolemia    SVD (spontaneous vaginal delivery)    x 4   Unspecified glaucoma(365.9)    bilateral   Past Surgical History:  Procedure Laterality Date   ABDOMINAL HYSTERECTOMY     APPENDECTOMY     COLONOSCOPY     EYE SURGERY     bilateral cataract eye surgery   EYE SURGERY     laser to relieve eye pressure - bilateral   LAPAROTOMY N/A 02/06/2014   Procedure: EXPLORATORY LAPAROTOMY;  Surgeon: Lavonia Drafts, MD;  Location: [facility];  Service: Gynecology;  Laterality: N/A;   MULTIPLE TOOTH EXTRACTIONS     upper   right hand surgery     ganglion cyst removed   TUBAL LIGATION      Current Outpatient Medications on File Prior to Visit  Medication Sig Dispense Refill   acetaminophen (TYLENOL) 325 MG tablet Take 650 mg by mouth every 6 (six) hours as needed.     amLODipine (NORVASC) 10 MG tablet Take 1 tablet (10 mg total) by mouth daily. 90 tablet 3   atenolol (TENORMIN) 50 MG tablet TAKE 1 TABLET BY MOUTH ONCE DAILY 90 tablet 3   brimonidine (ALPHAGAN) 0.2 % ophthalmic solution Place 1 drop into both eyes 2 (two) times daily.     cholecalciferol (VITAMIN D3) 25 MCG (1000 UNIT) tablet Take 1,000 Units by mouth daily.     cloNIDine (CATAPRES) 0.1 MG tablet TAKE 1 TABLET BY MOUTH IN THE MORNING AND 2 TABLETS AT BEDTIME 270 tablet 3   furosemide (LASIX) 40 MG tablet TAKE 1 TABLET BY MOUTH ONCE DAILY AS NEEDED 90 tablet 1   latanoprost (XALATAN) 0.005 % ophthalmic solution Place 1 drop into both eyes at bedtime.     lisinopril (ZESTRIL) 40 MG tablet TAKE 1 TABLET BY MOUTH ONCE A DAY 90 tablet 3   nirmatrelvir/ritonavir EUA, renal dosing, (PAXLOVID) 10 x 150 MG & 10 x 100MG  TABS  Take 2 tablets by mouth 2 (two) times daily for 5 days. (Take nirmatrelvir 150 mg one tablet twice daily for 5 days and ritonavir 100 mg one tablet twice daily for 5 days) Patient GFR is 58. 20 tablet 0   ondansetron (ZOFRAN ODT) 4 MG disintegrating tablet Take 1 tablet (4 mg total) by mouth every 8 (eight) hours as needed for nausea or vomiting. 20 tablet 0   pravastatin (PRAVACHOL) 80 MG tablet Take 1 tablet (80 mg total) by mouth daily. 90 tablet 3   predniSONE (DELTASONE) 5 MG tablet 5MG  PO QD X2 WEEKS, THEN 2.5MG  PO QD X2 WEEKS, THEN D/C. 21 tablet 0   timolol (TIMOPTIC) 0.5 % ophthalmic solution Place 1 drop into both eyes 2 (two) times daily.     traMADol (ULTRAM) 50 MG tablet Take 1 tablet (50 mg total) by mouth every 6 (six) hours as needed for severe pain. 30 tablet 0   vitamin B-12 (CYANOCOBALAMIN) 100 MCG tablet Take 100 mcg by mouth daily.     No current facility-administered medications  on file prior to visit.   Allergies  Allergen Reactions   Codeine Phosphate Nausea And Vomiting    REACTION: unspecified   Lipitor [Atorvastatin]    Pneumococcal Vaccines Hives   Sulfa Antibiotics     unknown   Latex Hives, Itching and Rash   Social History   Socioeconomic History   Marital status: Married    Spouse name: Not on file   Number of children: Not on file   Years of education: Not on file   Highest education level: Not on file  Occupational History   Occupation: raised tobacco    Employer: Retired  Tobacco Use   Smoking status: Never   Smokeless tobacco: Never  Vaping Use   Vaping Use: Never used  Substance and Sexual Activity   Alcohol use: No   Drug use: No   Sexual activity: Not Currently    Birth control/protection: None, Post-menopausal, Surgical  Other Topics Concern   Not on file  Social History Narrative    Has living will.  Daughter is HCPOA, Stephanie Acre.   Full code.   Social Determinants of Health   Financial Resource Strain: Low Risk    Difficulty of Paying Living Expenses: Not hard at all  Food Insecurity: Not on file  Transportation Needs: Not on file  Physical Activity: Not on file  Stress: Not on file  Social Connections: Not on file  Intimate Partner Violence: Not on file     Review of Systems     Objective:   Physical Exam Vitals reviewed.  Constitutional:      Appearance: Normal appearance.  Cardiovascular:     Rate and Rhythm: Normal rate and regular rhythm.     Heart sounds: Normal heart sounds. No murmur heard.   No friction rub. No gallop.  Pulmonary:     Effort: Pulmonary effort is normal.     Breath sounds: Normal breath sounds.  Abdominal:     General: Abdomen is flat. Bowel sounds are decreased.     Palpations: Abdomen is soft.     Tenderness: There is abdominal tenderness in the left lower quadrant.  Musculoskeletal:     Right lower leg: No edema.     Left lower leg: No edema.  Neurological:      Mental Status: She is alert.          Assessment & Plan:  Diverticulitis Begin Augmentin 875 mg twice daily  for 10 days.  Recheck in 2 days if no better or sooner if worsening

## 2021-01-10 NOTE — Progress Notes (Deleted)
Chronic Care Management Pharmacy Note  01/10/2021 Name:  Debra [name] MRN:  [id number] DOB:  12-May-1934  Subjective: Debra [name] is an 86 y.o. year old female who is a primary patient of Pickard, Cammie Mcgee, MD.  The CCM team was consulted for assistance with disease management and care coordination needs.    Engaged with patient face to face for follow up visit in response to provider referral for pharmacy case management and/or care coordination services.   Consent to Services:  The patient was given the following information about Chronic Care Management services today, agreed to services, and gave verbal consent: 1. CCM service includes personalized support from designated clinical staff supervised by the primary care provider, including individualized plan of care and coordination with other care providers 2. 24/7 contact phone numbers for assistance for urgent and routine care needs. 3. Service will only be billed when office clinical staff spend 20 minutes or more in a month to coordinate care. 4. Only one practitioner may furnish and bill the service in a calendar month. 5.The patient may stop CCM services at any time (effective at the end of the month) by phone call to the office staff. 6. The patient will be responsible for cost sharing (co-pay) of up to 20% of the service fee (after annual deductible is met). Patient agreed to services and consent obtained.  Patient Care Team: Susy Frizzle, MD as PCP - General (Family Medicine) Edythe Clarity, [facility] as Pharmacist (Pharmacist)  Recent office visits:  11/05/20 Dr. Dennard Schaumann For follow-up. No medication changes.    Recent consult visits:  12/11/20 Rheumatology Benjamine Mola, Resa Miner, MD. For new patient. No medication changes. 11/01/20 Phys Med Magnus Sinning, MD. For low back pain. STARTED Prednisone 10 mg daily. COMPLETED Prednisone 20 mg  10/23/20 Phys Med Magnus Sinning, MD. For pain. STARTED Prednisone 20 mg daily.   10/15/20 Optometry Lorie Apley T. For Residual stage of open-angle glaucoma, bilateral. No information given.   Hospital visits:  None since 10/04/20  Objective:  Lab Results  Component Value Date   CREATININE 0.97 (H) 11/05/2020   BUN 18 11/05/2020   GFR 52.40 (L) 09/22/2012   GFRNONAA 79 (L) 02/01/2014   GFRAA >90 02/01/2014   NA 140 11/05/2020   K 4.3 11/05/2020   CALCIUM 9.5 11/05/2020   CO2 29 11/05/2020   GLUCOSE 95 11/05/2020    Lab Results  Component Value Date/Time   GFR 52.40 (L) 09/22/2012 02:54 PM   GFR 69.12 07/19/2012 09:21 AM    Last diabetic Eye exam: No results found for: HMDIABEYEEXA  Last diabetic Foot exam: No results found for: HMDIABFOOTEX   Lab Results  Component Value Date   CHOL 225 (H) 11/05/2020   HDL 58 11/05/2020   LDLCALC 138 (H) 11/05/2020   LDLDIRECT 160.7 09/24/2009   TRIG 154 (H) 11/05/2020   CHOLHDL 3.9 11/05/2020    Hepatic Function Latest Ref Rng & Units 12/11/2020 11/05/2020 05/03/2020  Total Protein 6.1 - 8.1 g/dL 6.7 6.7 6.4  Albumin 3.6 - 5.1 g/dL - - -  AST 10 - 35 U/L - 12 12  ALT 6 - 29 U/L - 9 9  Alk Phosphatase 33 - 130 U/L - - -  Total Bilirubin 0.2 - 1.2 mg/dL - 0.7 0.8  Bilirubin, Direct 0.0 - 0.3 mg/dL - - -    Lab Results  Component Value Date/Time   TSH 3.84 05/03/2019 03:31 PM   TSH 3.74 09/24/2009 10:44 AM  CBC Latest Ref Rng & Units 11/05/2020 11/01/2020 05/03/2020  WBC 3.8 - 10.8 Thousand/uL 8.6 6.7 3.7(L)  Hemoglobin 11.7 - 15.5 g/dL 14.0 13.7 13.7  Hematocrit 35.0 - 45.0 % 41.2 40.6 40.3  Platelets 140 - 400 Thousand/uL 224 158 187    Lab Results  Component Value Date/Time   VD25OH 43 05/03/2020 10:59 AM   VD25OH 28 (L) 11/03/2019 02:37 PM   VD25OH 29.91 09/19/2013 08:55 AM    Clinical ASCVD: No  The ASCVD Risk score (Arnett DK, et al., 2019) failed to calculate for the following reasons:   The 2019 ASCVD risk score is only valid for ages 18 to 90    Depression screen PHQ 2/9 05/03/2020 05/03/2019  12/01/2016  Decreased Interest 0 0 0  Down, Depressed, Hopeless 0 0 0  PHQ - 2 Score 0 0 0  Altered sleeping 0 0 -  Tired, decreased energy 0 0 -  Change in appetite 0 0 -  Feeling bad or failure about yourself  0 0 -  Trouble concentrating 0 0 -  Moving slowly or fidgety/restless 0 0 -  Suicidal thoughts 0 0 -  PHQ-9 Score 0 0 -  Difficult doing work/chores Not difficult at all Not difficult at all -  Some recent data might be hidden      Social History   Tobacco Use  Smoking Status Never  Smokeless Tobacco Never   BP Readings from Last 3 Encounters:  01/10/21 132/88  12/11/20 (!) 153/72  11/07/20 (!) 170/82   Pulse Readings from Last 3 Encounters:  01/10/21 80  12/11/20 80  11/07/20 79   Wt Readings from Last 3 Encounters:  12/11/20 165 lb (74.8 kg)  11/05/20 161 lb (73 kg)  05/03/20 166 lb (75.3 kg)   BMI Readings from Last 3 Encounters:  12/11/20 29.23 kg/m  11/05/20 28.52 kg/m  05/03/20 29.41 kg/m    Assessment/Interventions: Review of patient past medical history, allergies, medications, health status, including review of consultants reports, laboratory and other test data, was performed as part of comprehensive evaluation and provision of chronic care management services.   SDOH:  (Social Determinants of Health) assessments and interventions performed: Yes  SDOH Screenings   Alcohol Screen: Low Risk    Last Alcohol Screening Score (AUDIT): 0  Depression (PHQ2-9): Low Risk    PHQ-2 Score: 0  Financial Resource Strain: Low Risk    Difficulty of Paying Living Expenses: Not hard at all  Food Insecurity: Not on file  Housing: Not on file  Physical Activity: Not on file  Social Connections: Not on file  Stress: Not on file  Tobacco Use: Low Risk    Smoking Tobacco Use: Never   Smokeless Tobacco Use: Never   Passive Exposure: Not on file  Transportation Needs: Not on file    [place]  Allergies  Allergen Reactions   Codeine Phosphate  Nausea And Vomiting    REACTION: unspecified   Lipitor [Atorvastatin]    Pneumococcal Vaccines Hives   Sulfa Antibiotics     unknown   Latex Hives, Itching and Rash    Medications Reviewed Today     Reviewed by Six, Eden Lathe, LPN (Licensed Practical Nurse) on 01/10/21 at [place] List Status: <None>   Medication Order Taking? Sig Documenting Provider Last Dose Status Informant  acetaminophen (TYLENOL) 325 MG tablet [id number] No Take 650 mg by mouth every 6 (six) hours as needed. [provider] Taking Active   amLODipine (NORVASC) 10  MG tablet [id number] No Take 1 tablet (10 mg total) by mouth daily. Susy Frizzle, MD Taking Active   atenolol (TENORMIN) 50 MG tablet [id number] No TAKE 1 TABLET BY MOUTH ONCE DAILY Susy Frizzle, MD Taking Active   brimonidine (ALPHAGAN) 0.2 % ophthalmic solution [id number] No Place 1 drop into both eyes 2 (two) times daily. [provider] Taking Active Self  cholecalciferol (VITAMIN D3) 25 MCG (1000 UNIT) tablet [id number] No Take 1,000 Units by mouth daily. [provider] Taking Active   cloNIDine (CATAPRES) 0.1 MG tablet [id number] No TAKE 1 TABLET BY MOUTH IN THE MORNING AND 2 TABLETS AT BEDTIME Alycia Rossetti, MD Taking Active   furosemide (LASIX) 40 MG tablet [id number] No TAKE 1 TABLET BY MOUTH ONCE DAILY AS NEEDED Vowinckel, Modena Nunnery, MD Taking Active   latanoprost (XALATAN) 0.005 % ophthalmic solution [id number] No Place 1 drop into both eyes at bedtime. [provider] Taking Active Self  lisinopril (ZESTRIL) 40 MG tablet [id number] No TAKE 1 TABLET BY MOUTH ONCE A DAY Casselberry, Modena Nunnery, MD Taking Active            Med Note Rosana Hoes, Jerilynn Mages   Thu Jul 04, 2020 11:15 AM) She is taking one-half tablet twice daily  nirmatrelvir/ritonavir EUA, renal dosing, (PAXLOVID) 10 x 150 MG & 10 x $Re'100MG'uTy$  TABS [id number]  Take 2 tablets by mouth 2 (two) times daily for 5 days. (Take nirmatrelvir 150 mg one tablet twice daily  for 5 days and ritonavir 100 mg one tablet twice daily for 5 days) Patient GFR is 58. Susy Frizzle, MD  Active   ondansetron (ZOFRAN ODT) 4 MG disintegrating tablet [id number]  Take 1 tablet (4 mg total) by mouth every 8 (eight) hours as needed for nausea or vomiting. Susy Frizzle, MD  Active   pravastatin (PRAVACHOL) 80 MG tablet [id number] No Take 1 tablet (80 mg total) by mouth daily. Kress, Modena Nunnery, MD Taking Active   predniSONE (DELTASONE) 5 MG tablet [id number]  $RemoveBe'5MG'MsiskZuPX$  PO QD X2 WEEKS, THEN 2.$RemoveBefor'5MG'obJuATHCdZnC$  PO QD X2 WEEKS, THEN D/C. Susy Frizzle, MD  Active   timolol (TIMOPTIC) 0.5 % ophthalmic solution [id number] No Place 1 drop into both eyes 2 (two) times daily. [provider] Taking Active Self  traMADol (ULTRAM) 50 MG tablet [id number] No Take 1 tablet (50 mg total) by mouth every 6 (six) hours as needed for severe pain. Jaynee Eagles, PA-C Taking Active   vitamin B-12 (CYANOCOBALAMIN) 100 MCG tablet [id number] No Take 100 mcg by mouth daily. [provider] Taking Active             Patient Active Problem List   Diagnosis Date Noted   Polymyalgia rheumatica ([place]) 12/11/2020   Positive ANA (antinuclear antibody) 12/11/2020   Hearing impaired 05/03/2019   Osteoarthritis of both knees 08/26/2016   Osteoarthritis, hand 08/26/2016   Peripheral edema 05/22/2014   Meningioma ([place]) 04/29/2012   Vestibular schwannoma ([place]) 03/26/2012   Vitamin D deficiency 09/24/2009   Vitamin B12 deficiency 07/05/2009   OSTEOPENIA 10/16/2008   Essential hypertension, benign 09/05/2008   MELANOMA, HX OF 09/05/2008   ISCHEMIC COLITIS, HX OF 05/26/2007   Unspecified glaucoma 04/21/2006   FEMALE STRESS INCONTINENCE 04/21/2006   DEGENERATIVE DISC DISEASE, LUMBAR SPINE 04/21/2006   DIVERTICULOSIS, COLON W/HEM 12/01/2005    Immunization History  Administered Date(s) Administered   Fluad Quad(high Dose 65+) 11/03/2019   Influenza Whole 01/14/2007, 12/14/2007, 03/22/2008, 12/17/2009    Influenza, High  Dose Seasonal PF 12/14/2016, 11/26/2017   Influenza,inj,Quad PF,6+ Mos 11/14/2013, 11/23/2014, 11/08/2015, 12/06/2018   Moderna Sars-Covid-2 Vaccination 03/28/2019, 04/25/2019, 12/26/2019   Pneumococcal Polysaccharide-23 01/05/2001   Td 03/02/2004, 05/19/2019   Tdap 05/19/2019   Zoster Recombinat (Shingrix) 05/19/2019, 11/10/2019   Zoster, Live 07/05/2009    Conditions to be addressed/monitored:  HTN, Osteopenia, Arthritis, Edema  There are no care plans that you recently modified to display for this patient.    Medication Assistance: None required.  Patient affirms current coverage meets needs.  Patient's preferred pharmacy is:  [facility], [address] [place] [address] Phone: [phone] Fax: [phone]  [id number] [place] [place] [id number]: [phone]: [phone] Uses pill box? Yes Pt endorses 100% compliance  We discussed: Benefits of medication synchronization, packaging and delivery as well as enhanced pharmacist oversight with [employer]. Patient decided to: Continue current medication management strategy  Care Plan and Follow Up Patient Decision:  Patient agrees to Care Plan and Follow-up.  Plan: The care management team will reach out to the patient again over the next 180 days.  CBeverly Milch PharmD Clinical Pharmacist BJonni SangerFamily Medicine [phone]  Current Barriers:  None identified at this time  Pharmacist Clinical Goal(s):  Patient will maintain control of BP as evidenced by home monitoring  adhere to plan to optimize therapeutic regimen for HTN as evidenced by report of adherence to recommended medication management changes contact provider office for questions/concerns as evidenced notation of same in electronic health record through collaboration with  PharmD and provider.   Interventions: 1:1 collaboration with PSusy Frizzle MD regarding development and update of comprehensive plan of care as evidenced by provider attestation and co-signature Inter-disciplinary care team collaboration (see longitudinal plan of care) Comprehensive medication review performed; medication list updated in electronic medical record  Hypertension (BP goal <140/90) -Controlled -Current treatment: Amlodipine 581mdaily Atenolol 5022maily Clonidine 0.1mg84me tablet AM and two tablets HS Lisinopril 40mg31m-half tablet BID -Medications previously tried: Verapamil (d/c) -Current home readings: not currently checking -Current dietary habits: bacon for breakfast,  -Current exercise habits: very active in her yard, loves to garden -Denies hypotensive/hypertensive symptoms -Educated on BP goals and benefits of medications for prevention of heart attack, stroke and kidney damage; Daily salt intake goal < 2300 mg; Importance of home blood pressure monitoring; Symptoms of hypotension and importance of maintaining adequate hydration; -Counseled to monitor BP at home weekly, document, and provide log at future appointments -Recommended to continue current medication  Osteopenia (Goal: Prevent fractures/falls) -Controlled -Last DEXA Scan: 10/10/2008  T-Score femoral neck: -1.3   T-Score lumbar spine: -1.2    10-year probability of major osteoporotic fracture: 9.3  10-year probability of hip fracture: 1.3 -Patient is not a candidate for pharmacologic treatment -Current treatment  None -Medications previously tried: none  -Recommend [phone] units of vitamin D daily. Recommend 1200 mg of calcium daily from dietary and supplemental sources. -Recommend updated DEXA scan.  Osteoarthritis (Goal: Minimize symptoms) -Controlled -Current treatment  Tylenol 500 mg bid -Medications previously tried: none noted -She rarely uses the Tramadol, reports Tylenol  controls her pain  -Recommended to continue current medication Counseled on benefits of regular Tylenol use  Edema (Goal: Minimize swelling) -Controlled -Current treatment  Furosemide 40mg 71m -Medications previously tried: none noted -Reports she sometimes takes half tablet -Swelling is occasional  and usually resolves with elevation or Lasix -Did report that sometimes Lasix does not make her feel well -Have asked her to monitor BP when she feels like this to let me know what it is  -Recommended to continue current medication   Patient Goals/Self-Care Activities Patient will:  - take medications as prescribed focus on medication adherence by pill box check blood pressure weekly, document, and provide at future appointments  Follow Up Plan: The care management team will reach out to the patient again over the next 180 days.

## 2021-01-28 ENCOUNTER — Other Ambulatory Visit: Payer: Self-pay | Admitting: *Deleted

## 2021-01-28 MED ORDER — CLONIDINE HCL 0.1 MG PO TABS: ORAL_TABLET | ORAL | 3 refills | Status: DC

## 2021-02-11 DIAGNOSIS — H40153 Residual stage of open-angle glaucoma, bilateral: Secondary | ICD-10-CM | POA: Diagnosis not present

## 2021-02-14 ENCOUNTER — Other Ambulatory Visit: Payer: Self-pay

## 2021-02-14 ENCOUNTER — Telehealth: Payer: Self-pay

## 2021-02-14 ENCOUNTER — Other Ambulatory Visit: Payer: Self-pay | Admitting: Family Medicine

## 2021-02-14 MED ORDER — PRAVASTATIN SODIUM 80 MG PO TABS: 80.0000 mg | ORAL_TABLET | Freq: Every day | ORAL | 3 refills | Status: DC

## 2021-02-14 MED ORDER — PREDNISONE 5 MG PO TABS: 5.0000 mg | ORAL_TABLET | Freq: Every day | ORAL | 1 refills | Status: DC

## 2021-02-14 NOTE — Telephone Encounter (Signed)
Pt's daughter, Juliann Pulse [facility]), calling to ask for refill on oral prednisone. Per chart: 12/26/20 5MG  PO QD X2 WEEKS, THEN 2.5MG  PO QD X2 WEEKS, THEN D/C.  Please advise, thanks!

## 2021-02-14 NOTE — Telephone Encounter (Signed)
Spoke with Juliann Pulse and she states pt did stop taking oral prednisone several weeks ago. She states pt reports pain in her back, across her shoulders and down to her elbows has returned. She states pt does not have good posture. She states pt has been taking Tylenol but this has not been helping much. They would like to know what else you would recommend.   Please advise, thanks!

## 2021-02-14 NOTE — Telephone Encounter (Signed)
LMTRC

## 2021-03-06 ENCOUNTER — Other Ambulatory Visit: Payer: Self-pay

## 2021-03-06 MED ORDER — LISINOPRIL 40 MG PO TABS: 40.0000 mg | ORAL_TABLET | Freq: Every day | ORAL | 3 refills | Status: DC

## 2021-05-08 ENCOUNTER — Ambulatory Visit (INDEPENDENT_AMBULATORY_CARE_PROVIDER_SITE_OTHER): Payer: PPO

## 2021-05-08 ENCOUNTER — Other Ambulatory Visit: Payer: Self-pay

## 2021-05-08 VITALS — BP 132/64 | HR 72 | Ht 63.0 in | Wt 165.0 lb

## 2021-05-08 DIAGNOSIS — Z0000 Encounter for general adult medical examination without abnormal findings: Secondary | ICD-10-CM

## 2021-05-08 NOTE — Progress Notes (Signed)
Subjective:   Debra [name] is a 87 y.o. female who presents for Medicare Annual (Subsequent) preventive examination.  Review of Systems     Cardiac Risk Factors include: advanced age (>43mn, >>22women);hypertension;sedentary lifestyle;Other (see comment), Risk factor comments: Degenerative Disk Dz, Osteopenia     Objective:    Today's Vitals   05/08/21 1449  BP: 132/64  Pulse: 72  SpO2: 97%  Weight: 165 lb (74.8 kg)  Height: '5\' 3"'$  (1.6 m)   Body mass index is 29.23 kg/m.  Advanced Directives 05/08/2021 11/11/2020 05/03/2020 05/03/2019 03/10/2016 03/07/2015 03/19/2014  Does Patient Have a Medical Advance Directive? Yes No Yes Yes Yes No Yes  Type of AParamedicof AGreen ValleyLiving will - - - - - HPress photographerLiving will  Does patient want to make changes to medical advance directive? - - No - Patient declined No - Patient declined - - -  Copy of HMineolain Chart? No - copy requested - - - - - Yes  Would patient like information on creating a medical advance directive? - No - Patient declined - - - - -    Current Medications (verified) Outpatient Encounter Medications as of 05/08/2021  Medication Sig   acetaminophen (TYLENOL) 325 MG tablet Take 650 mg by mouth every 6 (six) hours as needed.   amLODipine (NORVASC) 10 MG tablet Take 1 tablet (10 mg total) by mouth daily.   atenolol (TENORMIN) 50 MG tablet TAKE 1 TABLET BY MOUTH ONCE DAILY   brimonidine (ALPHAGAN) 0.2 % ophthalmic solution Place 1 drop into both eyes 2 (two) times daily.   cholecalciferol (VITAMIN D3) 25 MCG (1000 UNIT) tablet Take 1,000 Units by mouth daily.   cloNIDine (CATAPRES) 0.1 MG tablet Take 1 tablet in am and 2 tablets at bedtime   furosemide (LASIX) 40 MG tablet TAKE 1 TABLET BY MOUTH ONCE DAILY AS NEEDED   latanoprost (XALATAN) 0.005 % ophthalmic solution Place 1 drop into both eyes at bedtime.   lisinopril (ZESTRIL) 40 MG tablet Take 1 tablet (40  mg total) by mouth daily.   ondansetron (ZOFRAN ODT) 4 MG disintegrating tablet Take 1 tablet (4 mg total) by mouth every 8 (eight) hours as needed for nausea or vomiting.   pravastatin (PRAVACHOL) 80 MG tablet Take 1 tablet (80 mg total) by mouth daily.   predniSONE (DELTASONE) 5 MG tablet Take 1 tablet (5 mg total) by mouth daily with breakfast.   timolol (TIMOPTIC) 0.5 % ophthalmic solution Place 1 drop into both eyes 2 (two) times daily.   traMADol (ULTRAM) 50 MG tablet Take 1 tablet (50 mg total) by mouth every 6 (six) hours as needed for severe pain.   vitamin B-12 (CYANOCOBALAMIN) 100 MCG tablet Take 100 mcg by mouth daily.   amoxicillin-clavulanate (AUGMENTIN) 875-125 MG tablet Take 1 tablet by mouth 2 (two) times daily. (Patient not taking: Reported on 05/08/2021)   No facility-administered encounter medications on file as of 05/08/2021.    Allergies (verified) Codeine phosphate, Lipitor [atorvastatin], Pneumococcal vaccines, Sulfa antibiotics, and Latex   History: Past Medical History:  Diagnosis Date   Arthritis    Phreesia 07/02/2020   Brain mass    monitor by Dr PTrenton Gammon- right posterior fossa meningioma - thinks it's benign per patient   [facility]    hand, face - spots removed   Constipation    Degeneration of lumbar or lumbosacral intervertebral disc    arthritis in back, hips and hands -  otc med prn   Diarrhea    Diverticulosis of colon with hemorrhage    Dysrhythmia    Hx irregular heart beat - 15 yrs ago   Female stress incontinence    Glaucoma    Phreesia 07/02/2020   Headache    otc med prn   Hypertension    Lumbago    Missed abortion    x 2 - no surgery required   Paroxysmal supraventricular tachycardia (HCC)    Hx   PONV (postoperative nausea and vomiting)    Pure hypercholesterolemia    SVD (spontaneous vaginal delivery)    x 4   Unspecified glaucoma(365.9)    bilateral   Past Surgical History:  Procedure Laterality Date   ABDOMINAL HYSTERECTOMY      APPENDECTOMY     COLONOSCOPY     EYE SURGERY     bilateral cataract eye surgery   EYE SURGERY     laser to relieve eye pressure - bilateral   LAPAROTOMY N/A 02/06/2014   Procedure: EXPLORATORY LAPAROTOMY;  Surgeon: Lavonia Drafts, MD;  Location: [facility];  Service: Gynecology;  Laterality: N/A;   MULTIPLE TOOTH EXTRACTIONS     upper   right hand surgery     ganglion cyst removed   TUBAL LIGATION     Family History  Problem Relation Age of Onset   Aneurysm Mother    Alzheimer's disease Mother    Cancer Father        prostate   Cancer Sister    Arthritis Sister    Cancer Brother        prostate   Heart disease Brother    Hypertension Brother    Glaucoma Brother    Cancer Brother        prostate   Diabetes Brother    Heart disease Brother    Glaucoma Brother    Social History   Socioeconomic History   Marital status: Widowed    Spouse name: Not on file   Number of children: 4   Years of education: Not on file   Highest education level: Not on file  Occupational History   Occupation: raised tobacco    Employer: Retired  Tobacco Use   Smoking status: Never   Smokeless tobacco: Never  Vaping Use   Vaping Use: Never used  Substance and Sexual Activity   Alcohol use: No   Drug use: No   Sexual activity: Not Currently    Birth control/protection: None, Post-menopausal, Surgical  Other Topics Concern   Not on file  Social History Narrative    Has living will.  Daughter is HCPOA, Stephanie Acre.Full code.   3 sons and 1 daughter.   8 grandchildren   5 great grandchildren   Social Determinants of Radio broadcast assistant Strain: Low Risk    Difficulty of Paying Living Expenses: Not hard at all  Food Insecurity: No Food Insecurity   Worried About Charity fundraiser in the Last Year: Never true   Arboriculturist in the Last Year: Never true  Transportation Needs: No Transportation Needs   Lack of Transportation (Medical): No   Lack of  Transportation (Non-Medical): No  Physical Activity: Sufficiently Active   Days of Exercise per Week: 5 days   Minutes of Exercise per Session: 30 min  Stress: Not on file  Social Connections: Moderately Integrated   Frequency of Communication with Friends and Family: More than three times a week   Frequency of Social  Gatherings with Friends and Family: More than three times a week   Attends Religious Services: More than 4 times per year   Active Member of Clubs or Organizations: Yes   Attends Archivist Meetings: More than 4 times per year   Marital Status: Widowed    Tobacco Counseling Counseling given: Not Answered   Clinical Intake:  Pre-visit preparation completed: Yes  Pain : No/denies pain     BMI - recorded: 29.23 Nutritional Status: BMI 25 -29 Overweight Nutritional Risks: None Diabetes: No  How often do you need to have someone help you when you read instructions, pamphlets, or other written materials from your doctor or pharmacy?: 1 - Never  Diabetic?NO  Interpreter Needed?: No  Information entered by :: mj Glendene Wyer, lpn   Activities of Daily Living In your present state of health, do you have any difficulty performing the following activities: 05/08/2021  Hearing? Y  Comment Profound hearing loss. Wears hearing aids.  Vision? N  Difficulty concentrating or making decisions? N  Walking or climbing stairs? N  Dressing or bathing? N  Doing errands, shopping? N  Preparing Food and eating ? N  Using the Toilet? N  In the past six months, have you accidently leaked urine? Y  Comment At times. Wears pads.  Do you have problems with loss of bowel control? N  Managing your Medications? N  Managing your Finances? N  Housekeeping or managing your Housekeeping? N  Some recent data might be hidden    Patient Care Team: Susy Frizzle, MD as PCP - General (Family Medicine) Edythe Clarity, [facility] as Pharmacist (Pharmacist)  Indicate any recent  Medical Services you may have received from other than [facility] providers in the past year (date may be approximate).     Assessment:   This is a routine wellness examination for [place].  Hearing/Vision screen Hearing Screening - Comments:: Hearing aids, profound hearing loss.  Vision Screening - Comments:: Glasses. Dr. Gloriann Loan, every 3 months due to Glaucoma  Dietary issues and exercise activities discussed: Current Exercise Habits: Home exercise routine, Type of exercise: walking, Time (Minutes): 30, Frequency (Times/Week): 5, Weekly Exercise (Minutes/Week): 150, Intensity: Mild, Exercise limited by: cardiac condition(s);orthopedic condition(s)   Goals Addressed             This Visit's Progress    Exercise 3x per week (30 min per time)       Keep Moving and eat healthy       Depression Screen PHQ 2/9 Scores 05/08/2021 05/03/2020 05/03/2019 12/01/2016 08/26/2016 02/04/2015 10/17/2013  PHQ - 2 Score 0 0 0 0 0 0 0  PHQ- 9 Score - 0 0 - 0 - -    Fall Risk Fall Risk  05/08/2021 05/03/2020 05/03/2019 09/30/2018 12/01/2016  Falls in the past year? 0 1 0 (No Data) No  Comment - - - Emmi Telephone Survey: data to providers prior to load -  Number falls in past yr: 0 0 - (No Data) -  Comment - - - Emmi Telephone Survey Actual Response =  -  Injury with Fall? 0 0 - - -  Risk for fall due to : Impaired balance/gait;Impaired mobility History of fall(s) No Fall Risks - -  Follow up Falls prevention discussed Falls evaluation completed Falls evaluation completed - -    FALL RISK PREVENTION PERTAINING TO THE HOME:  Any stairs in or around the home? Yes  If so, are there any without handrails? No  Home free of  loose throw rugs in walkways, pet beds, electrical cords, etc? Yes  Adequate lighting in your home to reduce risk of falls? Yes   ASSISTIVE DEVICES UTILIZED TO PREVENT FALLS:  Life alert? No  Use of a cane, walker or w/c? No  Grab bars in the bathroom? Yes  Shower chair or bench in shower?  Yes  Elevated toilet seat or a handicapped toilet? Yes   TIMED UP AND GO:  Was the test performed? Yes .  Length of time to ambulate 10 feet: 12 sec.   Gait steady and fast without use of assistive device  Cognitive Function:     6CIT Screen 05/08/2021  What Year? 0 points  What month? 0 points  What time? 0 points  Count back from 20 2 points  Months in reverse 4 points  Repeat phrase 0 points  Total Score 6    Immunizations Immunization History  Administered Date(s) Administered   Fluad Quad(high Dose 65+) 11/03/2019   Influenza Whole 01/14/2007, 12/14/2007, 03/22/2008, 12/17/2009   Influenza, High Dose Seasonal PF 12/14/2016, 11/26/2017   Influenza,inj,Quad PF,6+ Mos 11/14/2013, 11/23/2014, 11/08/2015, 12/06/2018   Moderna Sars-Covid-2 Vaccination 03/28/2019, 04/25/2019, 12/26/2019   Pneumococcal Polysaccharide-23 01/05/2001   Td 03/02/2004, 05/19/2019   Tdap 05/19/2019   Zoster Recombinat (Shingrix) 05/19/2019, 11/10/2019   Zoster, Live 07/05/2009    TDAP status: Up to date  Flu Vaccine status: Due, Education has been provided regarding the importance of this vaccine. Advised may receive this vaccine at local pharmacy or Health Dept. Aware to provide a copy of the vaccination record if obtained from local pharmacy or Health Dept. Verbalized acceptance and understanding.  Pneumococcal vaccine status: Due, Education has been provided regarding the importance of this vaccine. Advised may receive this vaccine at local pharmacy or Health Dept. Aware to provide a copy of the vaccination record if obtained from local pharmacy or Health Dept. Verbalized acceptance and understanding.  Covid-19 vaccine status: Completed vaccines  Qualifies for Shingles Vaccine? Yes   Zostavax completed Yes   Shingrix Completed?: Yes  Screening Tests Health Maintenance  Topic Date Due   Pneumonia Vaccine 32+ Years old (2 - PCV) 01/05/2002   COVID-19 Vaccine (4 - Booster for Moderna  series) 02/20/2020   INFLUENZA VACCINE  09/30/2020   TETANUS/TDAP  05/18/2029   DEXA SCAN  Completed   Zoster Vaccines- Shingrix  Completed   HPV VACCINES  Aged Out    Health Maintenance  Health Maintenance Due  Topic Date Due   Pneumonia Vaccine 74+ Years old (2 - PCV) 01/05/2002   COVID-19 Vaccine (4 - Booster for Moderna series) 02/20/2020   INFLUENZA VACCINE  09/30/2020    Colorectal cancer screening: No longer required.   Mammogram status: No longer required due to AGE.  Bone Density status: Ordered Pt declined. Pt provided with contact info and advised to call to schedule appt.  Lung Cancer Screening: (Low Dose CT Chest recommended if Age 61-80 years, 30 pack-year currently smoking OR have quit w/in 15years.) does not qualify.   Additional Screening:  Hepatitis C Screening: does not qualify;  Vision Screening: Recommended annual ophthalmology exams for early detection of glaucoma and other disorders of the eye. Is the patient up to date with their annual eye exam?  Yes  Who is the provider or what is the name of the office in which the patient attends annual eye exams? Dr. Gloriann Loan in [place] If pt is not established with a provider, would they like to be referred to  a provider to establish care? No .   Dental Screening: Recommended annual dental exams for proper oral hygiene  Community Resource Referral / Chronic Care Management: CRR required this visit?  No   CCM required this visit?  No      Plan:     I have personally reviewed and noted the following in the patients chart:   Medical and social history Use of alcohol, tobacco or illicit drugs  Current medications and supplements including opioid prescriptions.  Functional ability and status Nutritional status Physical activity Advanced directives List of other physicians Hospitalizations, surgeries, and ER visits in previous 12 months Vitals Screenings to include cognitive, depression, and  falls Referrals and appointments  In addition, I have reviewed and discussed with patient certain preventive protocols, quality metrics, and best practice recommendations. A written personalized care plan for preventive services as well as general preventive health recommendations were provided to patient.     Chriss Driver, LPN   0/03/7791   Nurse Notes: Pt is up to date on all age appropriate health maintenance and vaccines. 6CIT score of 6.

## 2021-05-08 NOTE — Patient Instructions (Signed)
Ms. Debra [name] , Thank you for taking time to come for your Medicare Wellness Visit. I appreciate your ongoing commitment to your health goals. Please review the following plan we discussed and let me know if I can assist you in the future.   Screening recommendations/referrals: Colonoscopy: No longer required due to age.  Mammogram: No longer required due to age.  Bone Density: Declined.  Recommended yearly ophthalmology/optometry visit for glaucoma screening and checkup Recommended yearly dental visit for hygiene and checkup  Vaccinations: Influenza vaccine: Done 11/03/2019 Repeat annually  Pneumococcal vaccine: Done 01/05/2001.  Tdap vaccine: Done 05/19/2019 Repeat in 10 years  Shingles vaccine: Done 07/05/2009, 05/09/2019 and 11/10/2019   Covid-19:Done 03/28/2019, 04/25/2019 and 12/26/2019.  Advanced directives: Please bring a copy of your health care power of attorney and living will to the office to be added to your chart at your convenience.   Conditions/risks identified: KEEP UP THE GOOD WORK!!!  Next appointment: Follow up in one year for your annual wellness visit 2024.   Preventive Care 85 Years and Older, Female Preventive care refers to lifestyle choices and visits with your health care provider that can promote health and wellness. What does preventive care include? A yearly physical exam. This is also called an annual well check. Dental exams once or twice a year. Routine eye exams. Ask your health care provider how often you should have your eyes checked. Personal lifestyle choices, including: Daily care of your teeth and gums. Regular physical activity. Eating a healthy diet. Avoiding tobacco and drug use. Limiting alcohol use. Practicing safe sex. Taking low-dose aspirin every day. Taking vitamin and mineral supplements as recommended by your health care provider. What happens during an annual well check? The services and screenings done by your health care provider  during your annual well check will depend on your age, overall health, lifestyle risk factors, and family history of disease. Counseling  Your health care provider may ask you questions about your: Alcohol use. Tobacco use. Drug use. Emotional well-being. Home and relationship well-being. Sexual activity. Eating habits. History of falls. Memory and ability to understand (cognition). Work and work Statistician. Reproductive health. Screening  You may have the following tests or measurements: Height, weight, and BMI. Blood pressure. Lipid and cholesterol levels. These may be checked every 5 years, or more frequently if you are over 24 years old. Skin check. Lung cancer screening. You may have this screening every year starting at age 20 if you have a 30-pack-year history of smoking and currently smoke or have quit within the past 15 years. Fecal occult blood test (FOBT) of the stool. You may have this test every year starting at age 77. Flexible sigmoidoscopy or colonoscopy. You may have a sigmoidoscopy every 5 years or a colonoscopy every 10 years starting at age 11. Hepatitis C blood test. Hepatitis B blood test. Sexually transmitted disease (STD) testing. Diabetes screening. This is done by checking your blood sugar (glucose) after you have not eaten for a while (fasting). You may have this done every 1-3 years. Bone density scan. This is done to screen for osteoporosis. You may have this done starting at age 24. Mammogram. This may be done every 1-2 years. Talk to your health care provider about how often you should have regular mammograms. Talk with your health care provider about your test results, treatment options, and if necessary, the need for more tests. Vaccines  Your health care provider may recommend certain vaccines, such as: Influenza vaccine. This is recommended  every year. Tetanus, diphtheria, and acellular pertussis (Tdap, Td) vaccine. You may need a Td booster every  10 years. Zoster vaccine. You may need this after age 68. Pneumococcal 13-valent conjugate (PCV13) vaccine. One dose is recommended after age 35. Pneumococcal polysaccharide (PPSV23) vaccine. One dose is recommended after age 72. Talk to your health care provider about which screenings and vaccines you need and how often you need them. This information is not intended to replace advice given to you by your health care provider. Make sure you discuss any questions you have with your health care provider. Document Released: 03/15/2015 Document Revised: 11/06/2015 Document Reviewed: 12/18/2014 Elsevier Interactive Patient Education  2017 [employer] Prevention in the Home Falls can cause injuries. They can happen to people of all ages. There are many things you can do to make your home safe and to help prevent falls. What can I do on the outside of my home? Regularly fix the edges of walkways and driveways and fix any cracks. Remove anything that might make you trip as you walk through a door, such as a raised step or threshold. Trim any bushes or trees on the path to your home. Use bright outdoor lighting. Clear any walking paths of anything that might make someone trip, such as rocks or tools. Regularly check to see if handrails are loose or broken. Make sure that both sides of any steps have handrails. Any raised decks and porches should have guardrails on the edges. Have any leaves, snow, or ice cleared regularly. Use sand or salt on walking paths during winter. Clean up any spills in your garage right away. This includes oil or grease spills. What can I do in the bathroom? Use night lights. Install grab bars by the toilet and in the tub and shower. Do not use towel bars as grab bars. Use non-skid mats or decals in the tub or shower. If you need to sit down in the shower, use a plastic, non-slip stool. Keep the floor dry. Clean up any water that spills on the floor as soon as it  happens. Remove soap buildup in the tub or shower regularly. Attach bath mats securely with double-sided non-slip rug tape. Do not have throw rugs and other things on the floor that can make you trip. What can I do in the bedroom? Use night lights. Make sure that you have a light by your bed that is easy to reach. Do not use any sheets or blankets that are too big for your bed. They should not hang down onto the floor. Have a firm chair that has side arms. You can use this for support while you get dressed. Do not have throw rugs and other things on the floor that can make you trip. What can I do in the kitchen? Clean up any spills right away. Avoid walking on wet floors. Keep items that you use a lot in easy-to-reach places. If you need to reach something above you, use a strong step stool that has a grab bar. Keep electrical cords out of the way. Do not use floor polish or wax that makes floors slippery. If you must use wax, use non-skid floor wax. Do not have throw rugs and other things on the floor that can make you trip. What can I do with my stairs? Do not leave any items on the stairs. Make sure that there are handrails on both sides of the stairs and use them. Fix handrails that are broken  or loose. Make sure that handrails are as long as the stairways. Check any carpeting to make sure that it is firmly attached to the stairs. Fix any carpet that is loose or worn. Avoid having throw rugs at the top or bottom of the stairs. If you do have throw rugs, attach them to the floor with carpet tape. Make sure that you have a light switch at the top of the stairs and the bottom of the stairs. If you do not have them, ask someone to add them for you. What else can I do to help prevent falls? Wear shoes that: Do not have high heels. Have rubber bottoms. Are comfortable and fit you well. Are closed at the toe. Do not wear sandals. If you use a stepladder: Make sure that it is fully opened.  Do not climb a closed stepladder. Make sure that both sides of the stepladder are locked into place. Ask someone to hold it for you, if possible. Clearly mark and make sure that you can see: Any grab bars or handrails. First and last steps. Where the edge of each step is. Use tools that help you move around (mobility aids) if they are needed. These include: Canes. Walkers. Scooters. Crutches. Turn on the lights when you go into a dark area. Replace any light bulbs as soon as they burn out. Set up your furniture so you have a clear path. Avoid moving your furniture around. If any of your floors are uneven, fix them. If there are any pets around you, be aware of where they are. Review your medicines with your doctor. Some medicines can make you feel dizzy. This can increase your chance of falling. Ask your doctor what other things that you can do to help prevent falls. This information is not intended to replace advice given to you by your health care provider. Make sure you discuss any questions you have with your health care provider. Document Released: 12/13/2008 Document Revised: 07/25/2015 Document Reviewed: 03/23/2014 Elsevier Interactive Patient Education  2017 [employer].

## 2021-06-03 ENCOUNTER — Other Ambulatory Visit: Payer: Self-pay | Admitting: Family Medicine

## 2021-06-12 DIAGNOSIS — H40153 Residual stage of open-angle glaucoma, bilateral: Secondary | ICD-10-CM | POA: Diagnosis not present

## 2021-07-29 ENCOUNTER — Other Ambulatory Visit: Payer: Self-pay | Admitting: Family Medicine

## 2021-07-29 NOTE — Telephone Encounter (Signed)
Is this okay to refill for patient ?

## 2021-09-08 ENCOUNTER — Other Ambulatory Visit: Payer: Self-pay | Admitting: Family Medicine

## 2021-09-29 ENCOUNTER — Other Ambulatory Visit: Payer: Self-pay | Admitting: Family Medicine

## 2021-09-30 NOTE — Telephone Encounter (Signed)
Call to daughter- appointment scheduled Requested Prescriptions  Pending Prescriptions Disp Refills  . atenolol (TENORMIN) 50 MG tablet [Pharmacy Med Name: ATENOLOL 50 MG TAB] 30 tablet 0    Sig: TAKE 1 TABLET BY MOUTH ONCE A DAY (PLEASE KEEP ANNUAL VISIT FOR FUTURE REFILLS)     Cardiovascular: Beta Blockers 2 Failed - 09/29/2021 11:01 AM      Failed - Cr in normal range and within 360 days    Creatinine  Date Value Ref Range Status  03/10/2016 0.8 0.6 - 1.1 mg/dL Final   Creat  Date Value Ref Range Status  11/05/2020 0.97 (H) 0.60 - 0.95 mg/dL Final         Failed - Valid encounter within last 6 months    Recent Outpatient Visits          8 months ago Diverticulitis   [place] Susy Frizzle, MD   10 months ago Essential hypertension, benign   [place] Pickard, Cammie Mcgee, MD   1 year ago Routine general medical examination at a health care facility   [place], Modena Nunnery, MD   1 year ago Essential hypertension, benign   [place], Modena Nunnery, MD   2 years ago Routine general medical examination at a health care facility   [place], Modena Nunnery, MD      Future Appointments            In 1 month Pickard, Cammie Mcgee, MD [place]           Passed - Last BP in normal range    BP Readings from Last 1 Encounters:  05/08/21 132/64         Passed - Last Heart Rate in normal range    Pulse Readings from Last 1 Encounters:  05/08/21 72

## 2021-10-20 DIAGNOSIS — H40153 Residual stage of open-angle glaucoma, bilateral: Secondary | ICD-10-CM | POA: Diagnosis not present

## 2021-11-04 ENCOUNTER — Other Ambulatory Visit: Payer: Self-pay | Admitting: Family Medicine

## 2021-11-04 NOTE — Telephone Encounter (Signed)
Pls call pt for an annual for med eva/future refills

## 2021-11-06 ENCOUNTER — Ambulatory Visit (INDEPENDENT_AMBULATORY_CARE_PROVIDER_SITE_OTHER): Payer: PPO | Admitting: Family Medicine

## 2021-11-06 VITALS — BP 142/76 | HR 60 | Temp 98.2°F | Ht 63.0 in | Wt 166.6 lb

## 2021-11-06 DIAGNOSIS — Z0000 Encounter for general adult medical examination without abnormal findings: Secondary | ICD-10-CM | POA: Diagnosis not present

## 2021-11-06 DIAGNOSIS — M353 Polymyalgia rheumatica: Secondary | ICD-10-CM | POA: Diagnosis not present

## 2021-11-06 DIAGNOSIS — E7800 Pure hypercholesterolemia, unspecified: Secondary | ICD-10-CM

## 2021-11-06 DIAGNOSIS — I10 Essential (primary) hypertension: Secondary | ICD-10-CM | POA: Diagnosis not present

## 2021-11-06 MED ORDER — PREDNISONE 1 MG PO TABS: 4.0000 mg | ORAL_TABLET | Freq: Every day | ORAL | 3 refills | Status: DC

## 2021-11-06 NOTE — Progress Notes (Signed)
Subjective:    Patient ID: Debra [name], female    DOB: 29-Jul-1934, 87 y.o.   MRN: [id number]    Patient is a very sweet 87 year old Caucasian female  who is here for cpe.  Last year, she had been diagnosed with PMR and had been started on prednisone.  We were able to wean her successfully down to 5 mg of prednisone with minimal complications.  From that point we went to 2.5 mg of prednisone.  The patient states that she is now having pain in both shoulders right greater than left, her neck, and her proximal thigh muscles.  She reports weakness with standing and pain with walking.  She is getting progressively weaker in her proximal muscles especially her gluteus hamstring and quadriceps.  She denies any falls but she is having to use a walker.  She denies any depression or memory loss.  Blood pressure today is acceptable for age. Immunization History  Administered Date(s) Administered   Fluad Quad(high Dose 65+) 11/03/2019   Influenza Whole 01/14/2007, 12/14/2007, 03/22/2008, 12/17/2009   Influenza, High Dose Seasonal PF 12/14/2016, 11/26/2017   Influenza,inj,Quad PF,6+ Mos 11/14/2013, 11/23/2014, 11/08/2015, 12/06/2018   Moderna Sars-Covid-2 Vaccination 03/28/2019, 04/25/2019, 12/26/2019   Pneumococcal Polysaccharide-23 01/05/2001   Td 03/02/2004, 05/19/2019   Tdap 05/19/2019   Zoster Recombinat (Shingrix) 05/19/2019, 11/10/2019   Zoster, Live 07/05/2009    Past Medical History:  Diagnosis Date   Arthritis    Phreesia 07/02/2020   Brain mass    monitor by Dr Trenton Gammon - right posterior fossa meningioma - thinks it's benign per patient   Cancer ([place])    hand, face - spots removed   Constipation    Degeneration of lumbar or lumbosacral intervertebral disc    arthritis in back, hips and hands - otc med prn   Diarrhea    Diverticulosis of colon with hemorrhage    Dysrhythmia    Hx irregular heart beat - 15 yrs ago   Female stress incontinence    Glaucoma    [employer]reesia 07/02/2020    Headache    otc med prn   Hypertension    Lumbago    Missed abortion    x 2 - no surgery required   Paroxysmal supraventricular tachycardia (HCC)    Hx   PONV (postoperative nausea and vomiting)    Pure hypercholesterolemia    SVD (spontaneous vaginal delivery)    x 4   Unspecified glaucoma(365.9)    bilateral   Past Surgical History:  Procedure Laterality Date   ABDOMINAL HYSTERECTOMY     APPENDECTOMY     COLONOSCOPY     EYE SURGERY     bilateral cataract eye surgery   EYE SURGERY     laser to relieve eye pressure - bilateral   LAPAROTOMY N/A 02/06/2014   Procedure: EXPLORATORY LAPAROTOMY;  Surgeon: Lavonia Drafts, MD;  Location: [facility];  Service: Gynecology;  Laterality: N/A;   MULTIPLE TOOTH EXTRACTIONS     upper   right hand surgery     ganglion cyst removed   TUBAL LIGATION     Current Outpatient Medications on File Prior to Visit  Medication Sig Dispense Refill   acetaminophen (TYLENOL) 325 MG tablet Take 650 mg by mouth every 6 (six) hours as needed.     amLODipine (NORVASC) 10 MG tablet TAKE 1 TABLET BY MOUTH ONCE DAILY 3 tablet 0   amoxicillin-clavulanate (AUGMENTIN) 875-125 MG tablet Take 1 tablet by mouth 2 (two) times daily. (Patient not taking:  Reported on 05/08/2021) 20 tablet 0   atenolol (TENORMIN) 50 MG tablet TAKE 1 TABLET BY MOUTH ONCE A DAY (PLEASE KEEP ANNUAL VISIT FOR FUTURE REFILLS) 30 tablet 0   brimonidine (ALPHAGAN) 0.2 % ophthalmic solution Place 1 drop into both eyes 2 (two) times daily.     cholecalciferol (VITAMIN D3) 25 MCG (1000 UNIT) tablet Take 1,000 Units by mouth daily.     cloNIDine (CATAPRES) 0.1 MG tablet Take 1 tablet in am and 2 tablets at bedtime 270 tablet 3   furosemide (LASIX) 40 MG tablet TAKE 1 TABLET BY MOUTH ONCE DAILY AS NEEDED 90 tablet 1   latanoprost (XALATAN) 0.005 % ophthalmic solution Place 1 drop into both eyes at bedtime.     lisinopril (ZESTRIL) 40 MG tablet Take 1 tablet (40 mg total) by mouth daily. 90  tablet 3   ondansetron (ZOFRAN ODT) 4 MG disintegrating tablet Take 1 tablet (4 mg total) by mouth every 8 (eight) hours as needed for nausea or vomiting. 20 tablet 0   pravastatin (PRAVACHOL) 80 MG tablet Take 1 tablet (80 mg total) by mouth daily. 90 tablet 3   predniSONE (DELTASONE) 5 MG tablet TAKE 1 TABLET BY MOUTH ONCE A DAY WITH BREAKFAST. 30 tablet 1   timolol (TIMOPTIC) 0.5 % ophthalmic solution Place 1 drop into both eyes 2 (two) times daily.     traMADol (ULTRAM) 50 MG tablet Take 1 tablet (50 mg total) by mouth every 6 (six) hours as needed for severe pain. 30 tablet 0   vitamin B-12 (CYANOCOBALAMIN) 100 MCG tablet Take 100 mcg by mouth daily.     No current facility-administered medications on file prior to visit.   Allergies  Allergen Reactions   Codeine Phosphate Nausea And Vomiting    REACTION: unspecified   Lipitor [Atorvastatin]    Pneumococcal Vaccines Hives   Sulfa Antibiotics     unknown   Latex Hives, Itching and Rash   Social History   Socioeconomic History   Marital status: Widowed    Spouse name: Not on file   Number of children: 4   Years of education: Not on file   Highest education level: Not on file  Occupational History   Occupation: raised tobacco    Employer: Retired  Tobacco Use   Smoking status: Never   Smokeless tobacco: Never  Vaping Use   Vaping Use: Never used  Substance and Sexual Activity   Alcohol use: No   Drug use: No   Sexual activity: Not Currently    Birth control/protection: None, Post-menopausal, Surgical  Other Topics Concern   Not on file  Social History Narrative    Has living will.  Daughter is HCPOA, Stephanie Acre.Full code.   3 sons and 1 daughter.   8 grandchildren   5 great grandchildren   Social Determinants of Health   Financial Resource Strain: Low Risk  (05/08/2021)   Overall Financial Resource Strain (CARDIA)    Difficulty of Paying Living Expenses: Not hard at all  Food Insecurity: No Food Insecurity  (05/08/2021)   Hunger Vital Sign    Worried About Running Out of Food in the Last Year: Never true    Ran Out of Food in the Last Year: Never true  Transportation Needs: No Transportation Needs (05/08/2021)   PRAPARE - Hydrologist (Medical): No    Lack of Transportation (Non-Medical): No  Physical Activity: Sufficiently Active (05/08/2021)   Exercise Vital Sign    Days  of Exercise per Week: 5 days    Minutes of Exercise per Session: 30 min  Stress: Not on file  Social Connections: Moderately Integrated (05/08/2021)   Social Connection and Isolation Panel [NHANES]    Frequency of Communication with Friends and Family: More than three times a week    Frequency of Social Gatherings with Friends and Family: More than three times a week    Attends Religious Services: More than 4 times per year    Active Member of [employer] or Organizations: Yes    Attends Archivist Meetings: More than 4 times per year    Marital Status: Widowed  Intimate Partner Violence: Not At Risk (05/08/2021)   Humiliation, Afraid, Rape, and Kick questionnaire    Fear of Current or Ex-Partner: No    Emotionally Abused: No    Physically Abused: No    Sexually Abused: No     Review of Systems     Objective:   Physical Exam Vitals reviewed.  Constitutional:      Appearance: Normal appearance.  Cardiovascular:     Rate and Rhythm: Normal rate and regular rhythm.     Heart sounds: Normal heart sounds. No murmur heard.    No friction rub. No gallop.  Pulmonary:     Effort: Pulmonary effort is normal.     Breath sounds: Normal breath sounds.  Musculoskeletal:     Right lower leg: No edema.     Left lower leg: No edema.  Neurological:     Mental Status: She is alert.           Assessment & Plan:  Essential hypertension, benign - Plan: CBC with Differential/Platelet, Lipid panel, COMPLETE METABOLIC PANEL WITH GFR  Encounter for Medicare annual wellness  exam  HYPERCHOLESTEROLEMIA, PURE  PMR (polymyalgia rheumatica) (HCC) Increase the patient's prednisone to 4 mg a day.  I believe that I weaned her to quickly down to the 2.5 mg of prednisone.  I recommended that we recheck in 2 weeks to see if this is helping with the weakness and pain in her proximal leg muscles and in her shoulders.  Blood pressure is acceptable for age.  Check CBC, CMP, lipid panel.  Recommended that she use a cane or walker with ambulation.  Recommended a shower chair as well as a elevated commode seat to help with her getting on and off the toilet.  Recommended physical therapy to try to strengthen the muscles in her legs and prevent falls.  Fortunately there is no evidence of depression or dementia.  Would not recommend cancer screening given the fact she is 87 years old.

## 2021-11-07 ENCOUNTER — Telehealth: Payer: Self-pay

## 2021-11-07 LAB — CBC WITH DIFFERENTIAL/PLATELET
Absolute Monocytes: 315 cells/uL (ref 200–950)
Basophils Absolute: 29 cells/uL (ref 0–200)
Basophils Relative: 0.7 %
Eosinophils Absolute: 88 cells/uL (ref 15–500)
Eosinophils Relative: 2.1 %
HCT: 41.7 % (ref 35.0–45.0)
Hemoglobin: 14.1 g/dL (ref 11.7–15.5)
Lymphs Abs: 962 cells/uL (ref 850–3900)
MCH: 30.3 pg (ref 27.0–33.0)
MCHC: 33.8 g/dL (ref 32.0–36.0)
MCV: 89.7 fL (ref 80.0–100.0)
MPV: 9.1 fL (ref 7.5–12.5)
Monocytes Relative: 7.5 %
Neutro Abs: 2806 cells/uL (ref 1500–7800)
Neutrophils Relative %: 66.8 %
Platelets: 173 10*3/uL (ref 140–400)
RBC: 4.65 10*6/uL (ref 3.80–5.10)
RDW: 13 % (ref 11.0–15.0)
Total Lymphocyte: 22.9 %
WBC: 4.2 10*3/uL (ref 3.8–10.8)

## 2021-11-07 LAB — COMPLETE METABOLIC PANEL WITH GFR
AG Ratio: 1.8 (calc) (ref 1.0–2.5)
ALT: 9 U/L (ref 6–29)
AST: 11 U/L (ref 10–35)
Albumin: 4.2 g/dL (ref 3.6–5.1)
Alkaline phosphatase (APISO): 49 U/L (ref 37–153)
BUN/Creatinine Ratio: 17 (calc) (ref 6–22)
BUN: 17 mg/dL (ref 7–25)
CO2: 31 mmol/L (ref 20–32)
Calcium: 9.6 mg/dL (ref 8.6–10.4)
Chloride: 105 mmol/L (ref 98–110)
Creat: 0.98 mg/dL — ABNORMAL HIGH (ref 0.60–0.95)
Globulin: 2.3 g/dL (calc) (ref 1.9–3.7)
Glucose, Bld: 107 mg/dL — ABNORMAL HIGH (ref 65–99)
Potassium: 4.4 mmol/L (ref 3.5–5.3)
Sodium: 143 mmol/L (ref 135–146)
Total Bilirubin: 0.8 mg/dL (ref 0.2–1.2)
Total Protein: 6.5 g/dL (ref 6.1–8.1)
eGFR: 57 mL/min/{1.73_m2} — ABNORMAL LOW (ref >=60)

## 2021-11-07 LAB — LIPID PANEL
Cholesterol: 234 mg/dL — ABNORMAL HIGH (ref <200)
HDL: 51 mg/dL (ref >=50)
LDL Cholesterol (Calc): 152 mg/dL (calc) — ABNORMAL HIGH
Non-HDL Cholesterol (Calc): 183 mg/dL (calc) — ABNORMAL HIGH (ref <130)
Total CHOL/HDL Ratio: 4.6 (calc) (ref <5.0)
Triglycerides: 173 mg/dL — ABNORMAL HIGH (ref <150)

## 2021-11-07 NOTE — Telephone Encounter (Signed)
Pt called to advise that she is taking Pravastatin 80 mg for her cholesterol.  Pt also asks if she should have her Vitamin B12 checked due to hx of deficiency in past.

## 2021-11-10 NOTE — Telephone Encounter (Signed)
CPE was 11/06/21.

## 2021-11-20 ENCOUNTER — Telehealth: Payer: Self-pay

## 2021-11-20 NOTE — Telephone Encounter (Signed)
Pt's daughter Juliann Pulse called and states pt is doing better on her the Prednisone. Juliann Pulse states patient's pain is better.

## 2021-12-09 ENCOUNTER — Other Ambulatory Visit: Payer: Self-pay | Admitting: Family Medicine

## 2022-01-06 ENCOUNTER — Other Ambulatory Visit: Payer: Self-pay | Admitting: Family Medicine

## 2022-02-02 ENCOUNTER — Other Ambulatory Visit: Payer: Self-pay | Admitting: Family Medicine

## 2022-02-10 ENCOUNTER — Ambulatory Visit (INDEPENDENT_AMBULATORY_CARE_PROVIDER_SITE_OTHER): Payer: PPO

## 2022-02-10 DIAGNOSIS — Z23 Encounter for immunization: Secondary | ICD-10-CM | POA: Diagnosis not present

## 2022-03-05 DIAGNOSIS — H40153 Residual stage of open-angle glaucoma, bilateral: Secondary | ICD-10-CM | POA: Diagnosis not present

## 2022-03-13 ENCOUNTER — Other Ambulatory Visit: Payer: Self-pay | Admitting: Family Medicine

## 2022-04-13 ENCOUNTER — Other Ambulatory Visit: Payer: Self-pay | Admitting: Family Medicine

## 2022-04-28 ENCOUNTER — Other Ambulatory Visit: Payer: Self-pay | Admitting: Family Medicine

## 2022-04-28 NOTE — Telephone Encounter (Signed)
Prescription Request  04/28/2022  Is this a "Controlled Substance" medicine? No  LOV: 11/06/2021  What is the name of the medication or equipment?   predniSONE (DELTASONE) 1 MG tablet  **PHARMACY REQUESTING NEW RX**  Have you contacted your pharmacy to request a refill? Yes   Which pharmacy would you like this sent to?  [place], [address] [place] [address] Phone: [phone] Fax: [phone]    Patient notified that their request is being sent to the clinical staff for review and that they should receive a response within 2 business days.   Please advise pharmacist.

## 2022-04-28 NOTE — Telephone Encounter (Signed)
Requested medication (s) are due for refill today: yes  Requested medication (s) are on the active medication list: yes    Last refill: 11/06/21  #120  3 refills  Future visit scheduled No  Notes to clinic:Not delegated, please review. Thank you.  Requested Prescriptions  Pending Prescriptions Disp Refills   predniSONE (DELTASONE) 1 MG tablet 120 tablet 3    Sig: Take 4 tablets (4 mg total) by mouth daily with breakfast.     Not Delegated - Endocrinology:  Oral Corticosteroids Failed - 04/28/2022 12:01 PM      Failed - This refill cannot be delegated      Failed - Manual Review: Eye exam for IOP if prolonged treatment      Failed - Glucose (serum) in normal range and within 180 days    Glucose  Date Value Ref Range Status  03/10/2016 97 70 - 140 mg/dl Final    Comment:    Glucose reference range is for nonfasting patients. Fasting glucose reference range is 70- 100.   Glucose, Bld  Date Value Ref Range Status  11/06/2021 107 (H) 65 - 99 mg/dL Final    Comment:    .            Fasting reference interval . For someone without known diabetes, a glucose value between 100 and 125 mg/dL is consistent with prediabetes and should be confirmed with a follow-up test. .   06/04/2009 96 73 - 118 mg/dL Final         Failed - Last BP in normal range    BP Readings from Last 1 Encounters:  11/06/21 (!) 142/76         Failed - Valid encounter within last 6 months    Recent Outpatient Visits           1 year ago Diverticulitis   [place] [facility], Cammie Mcgee, MD   1 year ago Essential hypertension, benign   [place] Pickard, Cammie Mcgee, MD   1 year ago Routine general medical examination at a health care facility   [place], Modena Nunnery, MD   2 years ago Essential hypertension, benign   [place], Modena Nunnery, MD   2 years ago Routine general medical examination at a health care facility    [place], Modena Nunnery, MD              Failed - Bone Mineral Density or Dexa Scan completed in the last 2 years      32 - K in normal range and within 180 days    Potassium  Date Value Ref Range Status  11/06/2021 4.4 3.5 - 5.3 mmol/L Final  03/10/2016 3.7 3.5 - 5.1 mEq/L Final         Passed - Na in normal range and within 180 days    Sodium  Date Value Ref Range Status  11/06/2021 143 135 - 146 mmol/L Final  03/10/2016 142 136 - 145 mEq/L Final

## 2022-04-29 ENCOUNTER — Other Ambulatory Visit: Payer: Self-pay

## 2022-04-29 NOTE — Telephone Encounter (Signed)
Per pt's daughter LVM yesterday, stating that the pharmacy will need a verbal consent for refill.  Pls advice.

## 2022-04-30 MED ORDER — PREDNISONE 1 MG PO TABS: 4.0000 mg | ORAL_TABLET | Freq: Every day | ORAL | 3 refills | Status: DC

## 2022-05-14 ENCOUNTER — Ambulatory Visit (INDEPENDENT_AMBULATORY_CARE_PROVIDER_SITE_OTHER): Payer: PPO

## 2022-05-14 VITALS — BP 126/72 | Ht 63.0 in | Wt 169.2 lb

## 2022-05-14 DIAGNOSIS — Z0000 Encounter for general adult medical examination without abnormal findings: Secondary | ICD-10-CM | POA: Diagnosis not present

## 2022-05-14 NOTE — Progress Notes (Signed)
Subjective:   Debra [name] is a 88 y.o. female who presents for Medicare Annual (Subsequent) preventive examination.  Review of Systems     Cardiac Risk Factors include: advanced age (>14mn, >>65women);hypertension;dyslipidemia     Objective:    Today's Vitals   05/14/22 1431  BP: 126/72  Weight: 169 lb 3.2 oz (76.7 kg)  Height: '5\' 3"'$  (1.6 m)   Body mass index is 29.97 kg/m.     05/14/2022    3:03 PM 05/08/2021    3:03 PM 11/11/2020    2:23 PM 05/03/2020   10:38 AM 05/03/2019    2:58 PM 03/10/2016    3:37 PM 03/07/2015    5:08 PM  Advanced Directives  Does Patient Have a Medical Advance Directive? Yes Yes No Yes Yes Yes No  Type of Advance Directive Living will;[place]care [place]       Does patient want to make changes to medical advance directive? No - Patient declined   No - Patient declined No - Patient declined    Copy of [place]? No - copy requested No - copy requested       Would patient like information on creating a medical advance directive?   No - Patient declined        Current Medications (verified) Outpatient Encounter Medications as of 05/14/2022  Medication Sig   acetaminophen (TYLENOL) 325 MG tablet Take 650 mg by mouth every 6 (six) hours as needed.   amLODipine (NORVASC) 10 MG tablet TAKE 1 TABLET BY MOUTH ONCE DAILY   atenolol (TENORMIN) 50 MG tablet TAKE 1 TABLET BY MOUTH ONCE A DAY (PLEASE KEEP ANNUAL VISIT FOR FUTURE REFILLS)   brimonidine (ALPHAGAN) 0.2 % ophthalmic solution Place 1 drop into both eyes 2 (two) times daily.   cholecalciferol (VITAMIN D3) 25 MCG (1000 UNIT) tablet Take 1,000 Units by mouth daily.   cloNIDine (CATAPRES) 0.1 MG tablet TAKE 1 TABLET BY MOUTH IN THE MORNING AND 2 TABLETS BY MOUTH AT BEDTIME   furosemide (LASIX) 40 MG tablet TAKE 1 TABLET BY MOUTH ONCE DAILY AS NEEDED   latanoprost (XALATAN) 0.005 % ophthalmic solution Place 1 drop into both  eyes at bedtime.   lisinopril (ZESTRIL) 40 MG tablet TAKE 1 TABLET BY MOUTH DAILY   pravastatin (PRAVACHOL) 80 MG tablet TAKE 1 TABLET BY MOUTH ONCE A DAY   predniSONE (DELTASONE) 1 MG tablet Take 4 tablets (4 mg total) by mouth daily with breakfast.   timolol (TIMOPTIC) 0.5 % ophthalmic solution Place 1 drop into both eyes 2 (two) times daily.   vitamin B-12 (CYANOCOBALAMIN) 100 MCG tablet Take 100 mcg by mouth daily.   [DISCONTINUED] amoxicillin-clavulanate (AUGMENTIN) 875-125 MG tablet Take 1 tablet by mouth 2 (two) times daily. (Patient not taking: Reported on 11/06/2021)   [DISCONTINUED] ondansetron (ZOFRAN ODT) 4 MG disintegrating tablet Take 1 tablet (4 mg total) by mouth every 8 (eight) hours as needed for nausea or vomiting.   [DISCONTINUED] traMADol (ULTRAM) 50 MG tablet Take 1 tablet (50 mg total) by mouth every 6 (six) hours as needed for severe pain.   No facility-administered encounter medications on file as of 05/14/2022.    Allergies (verified) Codeine phosphate, Lipitor [atorvastatin], Pneumococcal vaccines, Sulfa antibiotics, and Latex   History: Past Medical History:  Diagnosis Date   Arthritis    Phreesia 07/02/2020   Brain mass    monitor by Dr PTrenton Gammon- right posterior fossa meningioma -  thinks it's benign per patient   Cancer ([place])    hand, face - spots removed   Constipation    Degeneration of lumbar or lumbosacral intervertebral disc    arthritis in back, hips and hands - otc med prn   Diarrhea    Diverticulosis of colon with hemorrhage    Dysrhythmia    Hx irregular heart beat - 15 yrs ago   Female stress incontinence    Glaucoma    Phreesia 07/02/2020   Headache    otc med prn   Hypertension    Lumbago    Missed abortion    x 2 - no surgery required   Paroxysmal supraventricular tachycardia    Hx   PONV (postoperative nausea and vomiting)    Pure hypercholesterolemia    SVD (spontaneous vaginal delivery)    x 4   Unspecified glaucoma(365.9)     bilateral   Past Surgical History:  Procedure Laterality Date   ABDOMINAL HYSTERECTOMY     APPENDECTOMY     COLONOSCOPY     EYE SURGERY     bilateral cataract eye surgery   EYE SURGERY     laser to relieve eye pressure - bilateral   LAPAROTOMY N/A 02/06/2014   Procedure: EXPLORATORY LAPAROTOMY;  Surgeon: Lavonia Drafts, MD;  Location: [facility];  Service: Gynecology;  Laterality: N/A;   MULTIPLE TOOTH EXTRACTIONS     upper   right hand surgery     ganglion cyst removed   TUBAL LIGATION     Family History  Problem Relation Age of Onset   Aneurysm Mother    Alzheimer's disease Mother    Cancer Father        prostate   Cancer Sister    Arthritis Sister    Cancer Brother        prostate   Heart disease Brother    Hypertension Brother    Glaucoma Brother    Cancer Brother        prostate   Diabetes Brother    Heart disease Brother    Glaucoma Brother    Social History   Socioeconomic History   Marital status: Widowed    Spouse name: Not on file   Number of children: 4   Years of education: Not on file   Highest education level: Not on file  Occupational History   Occupation: raised tobacco    Employer: Retired  Tobacco Use   Smoking status: Never   Smokeless tobacco: Never  Vaping Use   Vaping Use: Never used  Substance and Sexual Activity   Alcohol use: No   Drug use: No   Sexual activity: Not Currently    Birth control/protection: None, Post-menopausal, Surgical  Other Topics Concern   Not on file  Social History Narrative    Has living will.  Daughter is HCPOA, Stephanie Acre.Full code.   3 sons and 1 daughter.   8 grandchildren   5 great grandchildren   Social Determinants of Health   Financial Resource Strain: Low Risk  (05/14/2022)   Overall Financial Resource Strain (CARDIA)    Difficulty of Paying Living Expenses: Not hard at all  Food Insecurity: No Food Insecurity (05/14/2022)   Hunger Vital Sign    Worried About Running Out of Food in  the Last Year: Never true    Ran Out of Food in the Last Year: Never true  Transportation Needs: No Transportation Needs (05/14/2022)   PRAPARE - Transportation    Lack of  Transportation (Medical): No    Lack of Transportation (Non-Medical): No  Physical Activity: Insufficiently Active (05/14/2022)   Exercise Vital Sign    Days of Exercise per Week: 3 days    Minutes of Exercise per Session: 30 min  Stress: No Stress Concern Present (05/14/2022)   [place]    Feeling of Stress : Not at all  Social Connections: Moderately Integrated (05/14/2022)   Social Connection and Isolation Panel [NHANES]    Frequency of Communication with Friends and Family: More than three times a week    Frequency of Social Gatherings with Friends and Family: Three times a week    Attends Religious Services: More than 4 times per year    Active Member of Clubs or Organizations: Yes    Attends Archivist Meetings: More than 4 times per year    Marital Status: Widowed    Tobacco Counseling Counseling given: Not Answered   Clinical Intake:  Pre-visit preparation completed: Yes  Pain : No/denies pain  Diabetes: No  How often do you need to have someone help you when you read instructions, pamphlets, or other written materials from your doctor or pharmacy?: 1 - Never  Diabetic?No   Interpreter Needed?: No  Information entered by :: Denman George LPN   Activities of Daily Living    05/14/2022    3:04 PM  In your present state of health, do you have any difficulty performing the following activities:  Hearing? 1  Vision? 0  Difficulty concentrating or making decisions? 0  Walking or climbing stairs? 0  Dressing or bathing? 0  Doing errands, shopping? 0  Preparing Food and eating ? N  Using the Toilet? N  In the past six months, have you accidently leaked urine? N  Do you have problems with loss of bowel control? N   Managing your Medications? N  Managing your Finances? N  Housekeeping or managing your Housekeeping? N    Patient Care Team: Susy Frizzle, MD as PCP - General (Family Medicine) Edythe Clarity, [facility] as Pharmacist (Pharmacist) Lorelee Cover., MD (Ophthalmology)  Indicate any recent Medical Services you may have received from other than [facility] providers in the past year (date may be approximate).     Assessment:   This is a routine wellness examination for [place].  Hearing/Vision screen Hearing Screening - Comments:: Hard of hearing with bilateral hearing aids   Vision Screening - Comments:: Wears rx glasses - up to date with routine eye exams with Dr. Gloriann Loan    Dietary issues and exercise activities discussed: Current Exercise Habits: Home exercise routine, Time (Minutes): 30, Frequency (Times/Week): 3, Weekly Exercise (Minutes/Week): 90, Intensity: Mild   Goals Addressed             This Visit's Progress    COMPLETED: Track and Manage My Blood Pressure-Hypertension       Timeframe:  Long-Range Goal Priority:  High Start Date:  07/04/20                           Expected End Date:    01/04/21                   Follow Up Date 10/04/20   - check blood pressure weekly - write blood pressure results in a log or diary    Why is this important?   You won't feel high blood pressure, but  it can still hurt your blood vessels.  High blood pressure can cause heart or kidney problems. It can also cause a stroke.  Making lifestyle changes like losing a little weight or eating less salt will help.  Checking your blood pressure at home and at different times of the day can help to control blood pressure.  If the doctor prescribes medicine remember to take it the way the doctor ordered.  Call the office if you cannot afford the medicine or if there are questions about it.     Notes:        Depression Screen    05/14/2022    2:56 PM 05/08/2021    2:59 PM 05/03/2020    10:36 AM 05/03/2019    2:57 PM 12/01/2016    3:34 PM 08/26/2016   11:33 AM 02/04/2015   11:12 AM  PHQ 2/9 Scores  PHQ - 2 Score 0 0 0 0 0 0 0  PHQ- 9 Score   0 0  0     Fall Risk    05/14/2022    2:55 PM 11/06/2021    9:34 AM 05/08/2021    3:05 PM 05/03/2020   10:35 AM 05/03/2019    2:57 PM  Fall Risk   Falls in the past year? 0 0 0 1 0  Number falls in past yr: 0 0 0 0   Injury with Fall? 0 0 0 0   Risk for fall due to : No Fall Risks Impaired balance/gait;Impaired mobility Impaired balance/gait;Impaired mobility History of fall(s) No Fall Risks  Follow up Falls prevention discussed;Education provided;Falls evaluation completed Falls prevention discussed Falls prevention discussed Falls evaluation completed Falls evaluation completed    FALL RISK PREVENTION PERTAINING TO THE HOME:  Any stairs in or around the home? No  If so, are there any without handrails? No  Home free of loose throw rugs in walkways, pet beds, electrical cords, etc? Yes  Adequate lighting in your home to reduce risk of falls? Yes   ASSISTIVE DEVICES UTILIZED TO PREVENT FALLS:  Life alert? No  Use of a cane, walker or w/c? No  Grab bars in the bathroom? Yes  Shower chair or bench in shower? No  Elevated toilet seat or a handicapped toilet? Yes   TIMED UP AND GO:  Was the test performed? Yes .  Length of time to ambulate 10 feet: 6 sec.   Gait steady and fast without use of assistive device  Cognitive Function:        05/14/2022    3:04 PM 05/08/2021    3:09 PM  6CIT Screen  What Year? 0 points 0 points  What month? 0 points 0 points  What time? 0 points 0 points  Count back from 20 0 points 2 points  Months in reverse 0 points 4 points  Repeat phrase 0 points 0 points  Total Score 0 points 6 points    Immunizations Immunization History  Administered Date(s) Administered   Fluad Quad(high Dose 65+) 11/03/2019, 02/10/2022   Influenza Whole 01/14/2007, 12/14/2007, 03/22/2008, 12/17/2009    Influenza, High Dose Seasonal PF 12/14/2016, 11/26/2017   Influenza,inj,Quad PF,6+ Mos 11/14/2013, 11/23/2014, 11/08/2015, 12/06/2018   Moderna Sars-Covid-2 Vaccination 03/28/2019, 04/25/2019, 12/26/2019   Pneumococcal Polysaccharide-23 01/05/2001   Td 03/02/2004, 05/19/2019   Tdap 05/19/2019   Zoster Recombinat (Shingrix) 05/19/2019, 11/10/2019   Zoster, Live 07/05/2009    TDAP status: Up to date  Flu Vaccine status: Up to date  Pneumococcal vaccine status: Up to  date  Covid-19 vaccine status: Information provided on how to obtain vaccines.   Qualifies for Shingles Vaccine? Yes   Zostavax completed No   Shingrix Completed?: Yes  Screening Tests Health Maintenance  Topic Date Due   COVID-19 Vaccine (4 - 2023-24 season) 10/31/2021   Medicare Annual Wellness (AWV)  05/14/2023   DTaP/Tdap/Td (4 - Td or Tdap) 05/18/2029   INFLUENZA VACCINE  Completed   DEXA SCAN  Completed   Zoster Vaccines- Shingrix  Completed   HPV VACCINES  Aged Out   Pneumonia Vaccine 16+ Years old  Discontinued    Health Maintenance  Health Maintenance Due  Topic Date Due   COVID-19 Vaccine (4 - 2023-24 season) 10/31/2021    Colorectal cancer screening: No longer required.   Mammogram status: No longer required due to age.  Bone Density status: Completed 10/10/08. Results reflect: Bone density results: NORMAL. Repeat every 5 years.  Lung Cancer Screening: (Low Dose CT Chest recommended if Age 57-80 years, 30 pack-year currently smoking OR have quit w/in 15years.) does not qualify.   Lung Cancer Screening Referral: n/a  Additional Screening:  Hepatitis C Screening: does not qualify;   Vision Screening: Recommended annual ophthalmology exams for early detection of glaucoma and other disorders of the eye. Is the patient up to date with their annual eye exam?  Yes  Who is the provider or what is the name of the office in which the patient attends annual eye exams? Dr. Gloriann Loan  If pt is not  established with a provider, would they like to be referred to a provider to establish care? No .   Dental Screening: Recommended annual dental exams for proper oral hygiene  Community Resource Referral / Chronic Care Management: CRR required this visit?  No   CCM required this visit?  No      Plan:     I have personally reviewed and noted the following in the patient's chart:   Medical and social history Use of alcohol, tobacco or illicit drugs  Current medications and supplements including opioid prescriptions. Patient is not currently taking opioid prescriptions. Functional ability and status Nutritional status Physical activity Advanced directives List of other physicians Hospitalizations, surgeries, and ER visits in previous 12 months Vitals Screenings to include cognitive, depression, and falls Referrals and appointments  In addition, I have reviewed and discussed with patient certain preventive protocols, quality metrics, and best practice recommendations. A written personalized care plan for preventive services as well as general preventive health recommendations were provided to patient.     Denman George [place] [employer], [place]   [id number]   Nurse Notes: No concerns

## 2022-05-14 NOTE — Patient Instructions (Addendum)
Debra [name] , Thank you for taking time to come for your Medicare Wellness Visit. I appreciate your ongoing commitment to your health goals. Please review the following plan we discussed and let me know if I can assist you in the future.   These are the goals we discussed:  Goals      Exercise 3x per week (30 min per time)     Keep Moving and eat healthy        This is a list of the screening recommended for you and due dates:  Health Maintenance  Topic Date Due   COVID-19 Vaccine (4 - 2023-24 season) 10/31/2021   Medicare Annual Wellness Visit  05/14/2023   DTaP/Tdap/Td vaccine (4 - Td or Tdap) 05/18/2029   Flu Shot  Completed   DEXA scan (bone density measurement)  Completed   Zoster (Shingles) Vaccine  Completed   HPV Vaccine  Aged Out   Pneumonia Vaccine  Discontinued    Advanced directives: Forms are available if you choose in the future to pursue completion.  This is recommended in order to make sure that your health wishes are honored in the event that you are unable to verbalize them to the provider.    Conditions/risks identified: Aim for 30 minutes of exercise or brisk walking, 6-8 glasses of water, and 5 servings of fruits and vegetables each day.   Next appointment: Follow up in one year for your annual wellness visit    Preventive Care 65 Years and Older, Female Preventive care refers to lifestyle choices and visits with your health care provider that can promote health and wellness. What does preventive care include? A yearly physical exam. This is also called an annual well check. Dental exams once or twice a year. Routine eye exams. Ask your health care provider how often you should have your eyes checked. Personal lifestyle choices, including: Daily care of your teeth and gums. Regular physical activity. Eating a healthy diet. Avoiding tobacco and drug use. Limiting alcohol use. Practicing safe sex. Taking low-dose aspirin every day. Taking vitamin and  mineral supplements as recommended by your health care provider. What happens during an annual well check? The services and screenings done by your health care provider during your annual well check will depend on your age, overall health, lifestyle risk factors, and family history of disease. Counseling  Your health care provider may ask you questions about your: Alcohol use. Tobacco use. Drug use. Emotional well-being. Home and relationship well-being. Sexual activity. Eating habits. History of falls. Memory and ability to understand (cognition). Work and work Statistician. Reproductive health. Screening  You may have the following tests or measurements: Height, weight, and BMI. Blood pressure. Lipid and cholesterol levels. These may be checked every 5 years, or more frequently if you are over 86 years old. Skin check. Lung cancer screening. You may have this screening every year starting at age 86 if you have a 30-pack-year history of smoking and currently smoke or have quit within the past 15 years. Fecal occult blood test (FOBT) of the stool. You may have this test every year starting at age 86. Flexible sigmoidoscopy or colonoscopy. You may have a sigmoidoscopy every 5 years or a colonoscopy every 10 years starting at age 86. Hepatitis C blood test. Hepatitis B blood test. Sexually transmitted disease (STD) testing. Diabetes screening. This is done by checking your blood sugar (glucose) after you have not eaten for a while (fasting). You may have this done every 1-3 years.  Bone density scan. This is done to screen for osteoporosis. You may have this done starting at age 86. Mammogram. This may be done every 1-2 years. Talk to your health care provider about how often you should have regular mammograms. Talk with your health care provider about your test results, treatment options, and if necessary, the need for more tests. Vaccines  Your health care provider may recommend  certain vaccines, such as: Influenza vaccine. This is recommended every year. Tetanus, diphtheria, and acellular pertussis (Tdap, Td) vaccine. You may need a Td booster every 10 years. Zoster vaccine. You may need this after age 86. Pneumococcal 13-valent conjugate (PCV13) vaccine. One dose is recommended after age 86. Pneumococcal polysaccharide (PPSV23) vaccine. One dose is recommended after age 86. Talk to your health care provider about which screenings and vaccines you need and how often you need them. This information is not intended to replace advice given to you by your health care provider. Make sure you discuss any questions you have with your health care provider. Document Released: 03/15/2015 Document Revised: 11/06/2015 Document Reviewed: 12/18/2014 Elsevier Interactive Patient Education  2017 [place] Prevention in the Home Falls can cause injuries. They can happen to people of all ages. There are many things you can do to make your home safe and to help prevent falls. What can I do on the outside of my home? Regularly fix the edges of walkways and driveways and fix any cracks. Remove anything that might make you trip as you walk through a door, such as a raised step or threshold. Trim any bushes or trees on the path to your home. Use bright outdoor lighting. Clear any walking paths of anything that might make someone trip, such as rocks or tools. Regularly check to see if handrails are loose or broken. Make sure that both sides of any steps have handrails. Any raised decks and porches should have guardrails on the edges. Have any leaves, snow, or ice cleared regularly. Use sand or salt on walking paths during winter. Clean up any spills in your garage right away. This includes oil or grease spills. What can I do in the bathroom? Use night lights. Install grab bars by the toilet and in the tub and shower. Do not use towel bars as grab bars. Use non-skid mats or  decals in the tub or shower. If you need to sit down in the shower, use a plastic, non-slip stool. Keep the floor dry. Clean up any water that spills on the floor as soon as it happens. Remove soap buildup in the tub or shower regularly. Attach bath mats securely with double-sided non-slip rug tape. Do not have throw rugs and other things on the floor that can make you trip. What can I do in the bedroom? Use night lights. Make sure that you have a light by your bed that is easy to reach. Do not use any sheets or blankets that are too big for your bed. They should not hang down onto the floor. Have a firm chair that has side arms. You can use this for support while you get dressed. Do not have throw rugs and other things on the floor that can make you trip. What can I do in the kitchen? Clean up any spills right away. Avoid walking on wet floors. Keep items that you use a lot in easy-to-reach places. If you need to reach something above you, use a strong step stool that has a grab bar.  Keep electrical cords out of the way. Do not use floor polish or wax that makes floors slippery. If you must use wax, use non-skid floor wax. Do not have throw rugs and other things on the floor that can make you trip. What can I do with my stairs? Do not leave any items on the stairs. Make sure that there are handrails on both sides of the stairs and use them. Fix handrails that are broken or loose. Make sure that handrails are as long as the stairways. Check any carpeting to make sure that it is firmly attached to the stairs. Fix any carpet that is loose or worn. Avoid having throw rugs at the top or bottom of the stairs. If you do have throw rugs, attach them to the floor with carpet tape. Make sure that you have a light switch at the top of the stairs and the bottom of the stairs. If you do not have them, ask someone to add them for you. What else can I do to help prevent falls? Wear shoes that: Do not  have high heels. Have rubber bottoms. Are comfortable and fit you well. Are closed at the toe. Do not wear sandals. If you use a stepladder: Make sure that it is fully opened. Do not climb a closed stepladder. Make sure that both sides of the stepladder are locked into place. Ask someone to hold it for you, if possible. Clearly mark and make sure that you can see: Any grab bars or handrails. First and last steps. Where the edge of each step is. Use tools that help you move around (mobility aids) if they are needed. These include: Canes. Walkers. Scooters. Crutches. Turn on the lights when you go into a dark area. Replace any light bulbs as soon as they burn out. Set up your furniture so you have a clear path. Avoid moving your furniture around. If any of your floors are uneven, fix them. If there are any pets around you, be aware of where they are. Review your medicines with your doctor. Some medicines can make you feel dizzy. This can increase your chance of falling. Ask your doctor what other things that you can do to help prevent falls. This information is not intended to replace advice given to you by your health care provider. Make sure you discuss any questions you have with your health care provider. Document Released: 12/13/2008 Document Revised: 07/25/2015 Document Reviewed: 03/23/2014 Elsevier Interactive Patient Education  2017 [employer].

## 2022-05-15 ENCOUNTER — Other Ambulatory Visit: Payer: Self-pay | Admitting: Family Medicine

## 2022-05-15 NOTE — Telephone Encounter (Signed)
Requested medications are due for refill today.  yes  Requested medications are on the active medications list.  yes  Last refill. 04/13/2022 #30 0 rf  Future visit scheduled.   no  Notes to clinic.  Courtesy refill already given.    Requested Prescriptions  Pending Prescriptions Disp Refills   atenolol (TENORMIN) 50 MG tablet [Pharmacy Med Name: ATENOLOL 50MG  TABLET] 30 tablet 0    Sig: TAKE ONE TABLET BY MOUTH ONCE A DAY (PLEASE KEEP ANNUAL VISIT FOR FUTURE REFILLS)     Cardiovascular: Beta Blockers 2 Failed - 05/15/2022  5:45 PM      Failed - Cr in normal range and within 360 days    Creatinine  Date Value Ref Range Status  03/10/2016 0.8 0.6 - 1.1 mg/dL Final   Creat  Date Value Ref Range Status  11/06/2021 0.98 (H) 0.60 - 0.95 mg/dL Final         Failed - Valid encounter within last 6 months    Recent Outpatient Visits           1 year ago Diverticulitis   [place] Susy Frizzle, MD   1 year ago Essential hypertension, benign   [place] Pickard, Cammie Mcgee, MD   2 years ago Routine general medical examination at a health care facility   [place], Modena Nunnery, MD   2 years ago Essential hypertension, benign   [place], Modena Nunnery, MD   3 years ago Routine general medical examination at a health care facility   [place], Modena Nunnery, MD              Passed - Last BP in normal range    BP Readings from Last 1 Encounters:  05/14/22 126/72         Passed - Last Heart Rate in normal range    Pulse Readings from Last 1 Encounters:  11/06/21 60

## 2022-06-10 ENCOUNTER — Other Ambulatory Visit: Payer: Self-pay | Admitting: Family Medicine

## 2022-06-11 NOTE — Telephone Encounter (Signed)
Requested medication (s) are due for refill today - yes  Requested medication (s) are on the active medication list -yes  Future visit scheduled -no  Last refill: 05/18/22 #30  Notes to clinic: last OV 11/06/21- if only require yearly for BP medication- should be OK to fill- last RF a courtesy RF with note must keep physical appointment- please review for when patient is due appointment  Requested Prescriptions  Pending Prescriptions Disp Refills   atenolol (TENORMIN) 50 MG tablet [Pharmacy Med Name: ATENOLOL 50MG  TABLET] 30 tablet 0    Sig: TAKE ONE TABLET BY MOUTH ONCE A DAY (PLEASE KEEP ANNUAL VISIT FOR FUTURE REFILLS)     Cardiovascular: Beta Blockers 2 Failed - 06/10/2022 12:58 PM      Failed - Cr in normal range and within 360 days    Creatinine  Date Value Ref Range Status  03/10/2016 0.8 0.6 - 1.1 mg/dL Final   Creat  Date Value Ref Range Status  11/06/2021 0.98 (H) 0.60 - 0.95 mg/dL Final         Failed - Valid encounter within last 6 months    Recent Outpatient Visits           1 year ago Diverticulitis   [facility] Donita Brooks, MD   1 year ago Essential hypertension, benign   [facility] [place], Priscille Heidelberg, MD   2 years ago Routine general medical examination at a health care facility   [facility] [place], Velna Hatchet, MD   2 years ago Essential hypertension, benign   [facility] [place], Velna Hatchet, MD   3 years ago Routine general medical examination at a health care facility   [facility] [place], Velna Hatchet, MD              Passed - Last BP in normal range    BP Readings from Last 1 Encounters:  05/14/22 126/72         Passed - Last Heart Rate in normal range    Pulse Readings from Last 1 Encounters:  11/06/21 60          amLODipine (NORVASC) 10 MG tablet [Pharmacy Med Name: AMLODIPINE BESYLATE 10MG  TABLET] 60 tablet 0    Sig: TAKE ONE TABLET BY MOUTH ONCE DAILY  (PLEASE KEEP ANNUAL VISIT FOR FUTURE REFILLS)     Cardiovascular: Calcium Channel Blockers 2 Failed - 06/10/2022 12:58 PM      Failed - Valid encounter within last 6 months    Recent Outpatient Visits           1 year ago Diverticulitis   [facility] Donita Brooks, MD   1 year ago Essential hypertension, benign   [facility] [place], Priscille Heidelberg, MD   2 years ago Routine general medical examination at a health care facility   [facility] [place], Velna Hatchet, MD   2 years ago Essential hypertension, benign   [facility] [place], Velna Hatchet, MD   3 years ago Routine general medical examination at a health care facility   [facility] [place], Velna Hatchet, MD              Passed - Last BP in normal range    BP Readings from Last 1 Encounters:  05/14/22 126/72         Passed - Last Heart Rate in normal range  Pulse Readings from Last 1 Encounters:  11/06/21 60            Requested Prescriptions  Pending Prescriptions Disp Refills   atenolol (TENORMIN) 50 MG tablet [Pharmacy Med Name: ATENOLOL 50MG  TABLET] 30 tablet 0    Sig: TAKE ONE TABLET BY MOUTH ONCE A DAY (PLEASE KEEP ANNUAL VISIT FOR FUTURE REFILLS)     Cardiovascular: Beta Blockers 2 Failed - 06/10/2022 12:58 PM      Failed - Cr in normal range and within 360 days    Creatinine  Date Value Ref Range Status  03/10/2016 0.8 0.6 - 1.1 mg/dL Final   Creat  Date Value Ref Range Status  11/06/2021 0.98 (H) 0.60 - 0.95 mg/dL Final         Failed - Valid encounter within last 6 months    Recent Outpatient Visits           1 year ago Diverticulitis   [facility] Donita Brooks, MD   1 year ago Essential hypertension, benign   [facility] [place], Priscille Heidelberg, MD   2 years ago Routine general medical examination at a health care facility   [facility] [place], Velna Hatchet, MD   2 years  ago Essential hypertension, benign   [facility] Medicine [place], Velna Hatchet, MD   3 years ago Routine general medical examination at a health care facility   [facility] Medicine [place], Velna Hatchet, MD              Passed - Last BP in normal range    BP Readings from Last 1 Encounters:  05/14/22 126/72         Passed - Last Heart Rate in normal range    Pulse Readings from Last 1 Encounters:  11/06/21 60          amLODipine (NORVASC) 10 MG tablet [Pharmacy Med Name: AMLODIPINE BESYLATE 10MG  TABLET] 60 tablet 0    Sig: TAKE ONE TABLET BY MOUTH ONCE DAILY (PLEASE KEEP ANNUAL VISIT FOR FUTURE REFILLS)     Cardiovascular: Calcium Channel Blockers 2 Failed - 06/10/2022 12:58 PM      Failed - Valid encounter within last 6 months    Recent Outpatient Visits           1 year ago Diverticulitis   [facility] Donita Brooks, MD   1 year ago Essential hypertension, benign   [facility] [place], Priscille Heidelberg, MD   2 years ago Routine general medical examination at a health care facility   [facility] Medicine [place], Velna Hatchet, MD   2 years ago Essential hypertension, benign   [facility] Medicine [place], Velna Hatchet, MD   3 years ago Routine general medical examination at a health care facility   [facility] Medicine [place], Velna Hatchet, MD              Passed - Last BP in normal range    BP Readings from Last 1 Encounters:  05/14/22 126/72         Passed - Last Heart Rate in normal range    Pulse Readings from Last 1 Encounters:  11/06/21 60

## 2022-06-30 DIAGNOSIS — H40153 Residual stage of open-angle glaucoma, bilateral: Secondary | ICD-10-CM | POA: Diagnosis not present

## 2022-07-08 ENCOUNTER — Other Ambulatory Visit: Payer: Self-pay | Admitting: Family Medicine

## 2022-07-08 NOTE — Telephone Encounter (Signed)
Requested Prescriptions  Pending Prescriptions Disp Refills   atenolol (TENORMIN) 50 MG tablet [Pharmacy Med Name: ATENOLOL 50MG  TABLET] 90 tablet 3    Sig: TAKE ONE TABLET BY MOUTH ONCE A DAY (PLEASE KEEP ANNUAL VISIT FOR FUTURE REFILLS)     Cardiovascular: Beta Blockers 2 Failed - 07/08/2022  8:37 AM      Failed - Cr in normal range and within 360 days    Creatinine  Date Value Ref Range Status  03/10/2016 0.8 0.6 - 1.1 mg/dL Final   Creat  Date Value Ref Range Status  11/06/2021 0.98 (H) 0.60 - 0.95 mg/dL Final         Failed - Valid encounter within last 6 months    Recent Outpatient Visits           1 year ago Diverticulitis   [facility] Donita Brooks, MD   1 year ago Essential hypertension, benign   [facility] [place], Priscille Heidelberg, MD   2 years ago Routine general medical examination at a health care facility   [facility] Medicine [place], Velna Hatchet, MD   2 years ago Essential hypertension, benign   [facility] [place], Velna Hatchet, MD   3 years ago Routine general medical examination at a health care facility   [facility] Medicine [place], Velna Hatchet, MD              Passed - Last BP in normal range    BP Readings from Last 1 Encounters:  05/14/22 126/72         Passed - Last Heart Rate in normal range    Pulse Readings from Last 1 Encounters:  11/06/21 60

## 2022-07-10 ENCOUNTER — Other Ambulatory Visit: Payer: Self-pay | Admitting: Family Medicine

## 2022-07-10 NOTE — Telephone Encounter (Signed)
Called daughter to schedule OV. Olegario Messier will call back to schedule.

## 2022-07-10 NOTE — Telephone Encounter (Signed)
Requested medications are due for refill today.  yes  Requested medications are on the active medications list.  yes  Last refill. 06/11/2022 #60 0 rf  Future visit scheduled.   no  Notes to clinic.  Pt is more than 3 months overdue for OV.    Requested Prescriptions  Pending Prescriptions Disp Refills   amLODipine (NORVASC) 10 MG tablet [Pharmacy Med Name: AMLODIPINE BESYLATE 10MG  TABLET] 60 tablet 0    Sig: TAKE ONE TABLET BY MOUTH ONCE DAILY (PLEASE KEEP ANNUAL VISIT FOR FUTURE REFILLS)     Cardiovascular: Calcium Channel Blockers 2 Failed - 07/10/2022  1:29 PM      Failed - Valid encounter within last 6 months    Recent Outpatient Visits           1 year ago Diverticulitis   [facility] Donita Brooks, MD   1 year ago Essential hypertension, benign   [facility] [place], Priscille Heidelberg, MD   2 years ago Routine general medical examination at a health care facility   [facility] [place], Velna Hatchet, MD   2 years ago Essential hypertension, benign   [facility] Medicine [place], Velna Hatchet, MD   3 years ago Routine general medical examination at a health care facility   [facility] [place], Velna Hatchet, MD              Passed - Last BP in normal range    BP Readings from Last 1 Encounters:  05/14/22 126/72         Passed - Last Heart Rate in normal range    Pulse Readings from Last 1 Encounters:  11/06/21 60

## 2022-07-16 ENCOUNTER — Ambulatory Visit (INDEPENDENT_AMBULATORY_CARE_PROVIDER_SITE_OTHER): Payer: PPO | Admitting: Family Medicine

## 2022-07-16 ENCOUNTER — Encounter: Payer: Self-pay | Admitting: Family Medicine

## 2022-07-16 VITALS — BP 132/84 | HR 62 | Temp 98.2°F | Ht 63.0 in | Wt 170.6 lb

## 2022-07-16 DIAGNOSIS — E7800 Pure hypercholesterolemia, unspecified: Secondary | ICD-10-CM | POA: Diagnosis not present

## 2022-07-16 DIAGNOSIS — M353 Polymyalgia rheumatica: Secondary | ICD-10-CM | POA: Diagnosis not present

## 2022-07-16 DIAGNOSIS — M25512 Pain in left shoulder: Secondary | ICD-10-CM

## 2022-07-16 DIAGNOSIS — I10 Essential (primary) hypertension: Secondary | ICD-10-CM | POA: Diagnosis not present

## 2022-07-16 NOTE — Progress Notes (Signed)
Subjective:    Patient ID: Debra [name], female    DOB: 08/17/1934, 88 y.o.   MRN: [id number]    Patient is a very sweet 88 year old Caucasian female  who is here for follow-up of her chronic medical conditions.  She has a history of PMR.  She is supposed to be taking 4 mg a day of prednisone however recently she increased the dose to 6 mg.  She complains of bilateral shoulder pain left greater than right.  She states that in the morning her shoulders hurt so bad that she cannot even drink her coffee.  After she takes the prednisone, the pain improves and she can go about her day however the pain is always there.  She is reporting pain with even passive range of motion in both shoulders today.  She denies any pain in her hips.  The pain radiates from shoulder to shoulder and also includes her upper neck.  She denies any numbness or tingling radiating down her arms or burning or stinging pain radiating down her arms.  She does have diminished strength in her legs.  She ambulates with a walker she denies any pain in her knees.  She does complain of arthritic pain in her fingers.  Her blood pressure today is excellent  Past Medical History:  Diagnosis Date   Arthritis    [employer]reesia 07/02/2020   Brain mass    monitor by Dr Dutch Quint - right posterior fossa meningioma - thinks it's benign per patient   Cancer (HCC)    hand, face - spots removed   Constipation    Degeneration of lumbar or lumbosacral intervertebral disc    arthritis in back, hips and hands - otc med prn   Diarrhea    Diverticulosis of colon with hemorrhage    Dysrhythmia    Hx irregular heart beat - 15 yrs ago   Female stress incontinence    Glaucoma    [employer]reesia 07/02/2020   Headache    otc med prn   Hypertension    Lumbago    Missed abortion    x 2 - no surgery required   Paroxysmal supraventricular tachycardia    Hx   PONV (postoperative nausea and vomiting)    Pure hypercholesterolemia    SVD (spontaneous vaginal  delivery)    x 4   Unspecified glaucoma(365.9)    bilateral   Past Surgical History:  Procedure Laterality Date   ABDOMINAL HYSTERECTOMY     APPENDECTOMY     COLONOSCOPY     EYE SURGERY     bilateral cataract eye surgery   EYE SURGERY     laser to relieve eye pressure - bilateral   LAPAROTOMY N/A 02/06/2014   Procedure: EXPLORATORY LAPAROTOMY;  Surgeon: Willodean Rosenthal, MD;  Location: [facility];  Service: Gynecology;  Laterality: N/A;   MULTIPLE TOOTH EXTRACTIONS     upper   right hand surgery     ganglion cyst removed   TUBAL LIGATION     Current Outpatient Medications on File Prior to Visit  Medication Sig Dispense Refill   acetaminophen (TYLENOL) 325 MG tablet Take 650 mg by mouth every 6 (six) hours as needed.     amLODipine (NORVASC) 10 MG tablet TAKE ONE TABLET BY MOUTH ONCE DAILY (PLEASE KEEP ANNUAL VISIT FOR FUTURE REFILLS) 60 tablet 0   atenolol (TENORMIN) 50 MG tablet TAKE ONE TABLET BY MOUTH ONCE A DAY (PLEASE KEEP ANNUAL VISIT FOR FUTURE REFILLS) 90 tablet 3   brimonidine (  ALPHAGAN) 0.2 % ophthalmic solution Place 1 drop into both eyes 2 (two) times daily.     cholecalciferol (VITAMIN D3) 25 MCG (1000 UNIT) tablet Take 1,000 Units by mouth daily.     cloNIDine (CATAPRES) 0.1 MG tablet TAKE 1 TABLET BY MOUTH IN THE MORNING AND 2 TABLETS BY MOUTH AT BEDTIME 270 tablet 3   furosemide (LASIX) 40 MG tablet TAKE 1 TABLET BY MOUTH ONCE DAILY AS NEEDED 90 tablet 1   latanoprost (XALATAN) 0.005 % ophthalmic solution Place 1 drop into both eyes at bedtime.     lisinopril (ZESTRIL) 40 MG tablet TAKE 1 TABLET BY MOUTH DAILY 90 tablet 3   pravastatin (PRAVACHOL) 80 MG tablet TAKE 1 TABLET BY MOUTH ONCE A DAY 90 tablet 0   predniSONE (DELTASONE) 1 MG tablet Take 4 tablets (4 mg total) by mouth daily with breakfast. 120 tablet 3   timolol (TIMOPTIC) 0.5 % ophthalmic solution Place 1 drop into both eyes 2 (two) times daily.     traMADol (ULTRAM) 50 MG tablet Take 50 mg by mouth  every 6 (six) hours as needed.     vitamin B-12 (CYANOCOBALAMIN) 100 MCG tablet Take 100 mcg by mouth daily. (Patient not taking: Reported on 07/16/2022)     No current facility-administered medications on file prior to visit.   Allergies  Allergen Reactions   Codeine Phosphate Nausea And Vomiting    REACTION: unspecified   Lipitor [Atorvastatin]    Pneumococcal Vaccines Hives   Sulfa Antibiotics     unknown   Latex Hives, Itching and Rash   Social History   Socioeconomic History   Marital status: Widowed    Spouse name: Not on file   Number of children: 4   Years of education: Not on file   Highest education level: Not on file  Occupational History   Occupation: raised tobacco    Employer: Retired  Tobacco Use   Smoking status: Never   Smokeless tobacco: Never  Vaping Use   Vaping Use: Never used  Substance and Sexual Activity   Alcohol use: No   Drug use: No   Sexual activity: Not Currently    Birth control/protection: None, Post-menopausal, Surgical  Other Topics Concern   Not on file  Social History Narrative    Has living will.  Daughter is HCPOA, Lenice Llamas.Full code.   3 sons and 1 daughter.   8 grandchildren   5 great grandchildren   Social Determinants of Health   Financial Resource Strain: Low Risk  (05/14/2022)   Overall Financial Resource Strain (CARDIA)    Difficulty of Paying Living Expenses: Not hard at all  Food Insecurity: No Food Insecurity (05/14/2022)   Hunger Vital Sign    Worried About Running Out of Food in the Last Year: Never true    Ran Out of Food in the Last Year: Never true  Transportation Needs: No Transportation Needs (05/14/2022)   PRAPARE - Administrator, Civil Service (Medical): No    Lack of Transportation (Non-Medical): No  Physical Activity: Insufficiently Active (05/14/2022)   Exercise Vital Sign    Days of Exercise per Week: 3 days    Minutes of Exercise per Session: 30 min  Stress: No Stress Concern  Present (05/14/2022)   [employer] of Occupational Health - Occupational Stress Questionnaire    Feeling of Stress : Not at all  Social Connections: Moderately Integrated (05/14/2022)   Social Connection and Isolation Panel [NHANES]    Frequency of  Communication with Friends and Family: More than three times a week    Frequency of Social Gatherings with Friends and Family: Three times a week    Attends Religious Services: More than 4 times per year    Active Member of Clubs or Organizations: Yes    Attends Banker Meetings: More than 4 times per year    Marital Status: Widowed  Intimate Partner Violence: Not At Risk (05/14/2022)   Humiliation, Afraid, Rape, and Kick questionnaire    Fear of Current or Ex-Partner: No    Emotionally Abused: No    Physically Abused: No    Sexually Abused: No     Review of Systems     Objective:   Physical Exam Vitals reviewed.  Constitutional:      Appearance: Normal appearance.  Cardiovascular:     Rate and Rhythm: Normal rate and regular rhythm.     Heart sounds: Normal heart sounds. No murmur heard.    No friction rub. No gallop.  Pulmonary:     Effort: Pulmonary effort is normal.     Breath sounds: Normal breath sounds.  Musculoskeletal:     Right lower leg: No edema.     Left lower leg: No edema.  Neurological:     Mental Status: She is alert.           Assessment & Plan:  Essential hypertension, benign  HYPERCHOLESTEROLEMIA, PURE - Plan: CBC with Differential/Platelet, Lipid panel, COMPLETE METABOLIC PANEL WITH GFR  PMR (polymyalgia rheumatica) (HCC) Patient seems to be doing relatively well on 4 to 6 mg a day of prednisone.  However I do not know how much of the pain in her shoulders is from PMR and how much of the pain in her shoulders is due to arthritis in the shoulders or even rotator cuff tendinitis.  The patient would like to try cortisone injection today in her left shoulder.  Using sterile technique, I  injected the left shoulder with 2 cc of lidocaine, 2 cc of Marcaine, 2 cc of 40 mg/mL Kenalog.  The patient tolerated the procedure well.  If she sees significant benefit with regards to the pain in her shoulders after this we could repeat the injection in her right shoulder.  If the pain does not improve at all after the cortisone injection, I would attribute this to PMR and we may need to uptitrate her dosage from 6 mg to a higher dose.  Check CBC CMP and a lipid panel to monitor cholesterol along with renal function

## 2022-07-17 LAB — COMPLETE METABOLIC PANEL WITH GFR
AG Ratio: 2 (calc) (ref 1.0–2.5)
ALT: 9 U/L (ref 6–29)
AST: 10 U/L (ref 10–35)
Albumin: 4.2 g/dL (ref 3.6–5.1)
Alkaline phosphatase (APISO): 62 U/L (ref 37–153)
BUN: 15 mg/dL (ref 7–25)
CO2: 27 mmol/L (ref 20–32)
Calcium: 9.4 mg/dL (ref 8.6–10.4)
Chloride: 104 mmol/L (ref 98–110)
Creat: 0.81 mg/dL (ref 0.60–0.95)
Globulin: 2.1 g/dL (calc) (ref 1.9–3.7)
Glucose, Bld: 100 mg/dL — ABNORMAL HIGH (ref 65–99)
Potassium: 4.6 mmol/L (ref 3.5–5.3)
Sodium: 140 mmol/L (ref 135–146)
Total Bilirubin: 0.8 mg/dL (ref 0.2–1.2)
Total Protein: 6.3 g/dL (ref 6.1–8.1)
eGFR: 71 mL/min/{1.73_m2} (ref >=60)

## 2022-07-17 LAB — CBC WITH DIFFERENTIAL/PLATELET
Absolute Monocytes: 330 cells/uL (ref 200–950)
Basophils Absolute: 40 cells/uL (ref 0–200)
Basophils Relative: 0.8 %
Eosinophils Absolute: 90 cells/uL (ref 15–500)
Eosinophils Relative: 1.8 %
HCT: 39.9 % (ref 35.0–45.0)
Hemoglobin: 13.6 g/dL (ref 11.7–15.5)
Lymphs Abs: 1000 cells/uL (ref 850–3900)
MCH: 29.8 pg (ref 27.0–33.0)
MCHC: 34.1 g/dL (ref 32.0–36.0)
MCV: 87.3 fL (ref 80.0–100.0)
MPV: 9.3 fL (ref 7.5–12.5)
Monocytes Relative: 6.6 %
Neutro Abs: 3540 cells/uL (ref 1500–7800)
Neutrophils Relative %: 70.8 %
Platelets: 172 10*3/uL (ref 140–400)
RBC: 4.57 10*6/uL (ref 3.80–5.10)
RDW: 12.9 % (ref 11.0–15.0)
Total Lymphocyte: 20 %
WBC: 5 10*3/uL (ref 3.8–10.8)

## 2022-07-17 LAB — LIPID PANEL
Cholesterol: 201 mg/dL — ABNORMAL HIGH (ref <200)
HDL: 63 mg/dL (ref >=50)
LDL Cholesterol (Calc): 116 mg/dL (calc) — ABNORMAL HIGH
Non-HDL Cholesterol (Calc): 138 mg/dL (calc) — ABNORMAL HIGH (ref <130)
Total CHOL/HDL Ratio: 3.2 (calc) (ref <5.0)
Triglycerides: 110 mg/dL (ref <150)

## 2022-07-29 ENCOUNTER — Other Ambulatory Visit: Payer: Self-pay

## 2022-07-29 ENCOUNTER — Emergency Department (HOSPITAL_COMMUNITY): Payer: PPO

## 2022-07-29 ENCOUNTER — Emergency Department (HOSPITAL_COMMUNITY)
Admission: EM | Admit: 2022-07-29 | Discharge: 2022-07-29 | Disposition: A | Discharge status: Home / Self Care | Payer: PPO | Attending: Emergency Medicine | Admitting: Emergency Medicine

## 2022-07-29 DIAGNOSIS — R1032 Left lower quadrant pain: Secondary | ICD-10-CM | POA: Diagnosis not present

## 2022-07-29 DIAGNOSIS — K5792 Diverticulitis of intestine, part unspecified, without perforation or abscess without bleeding: Secondary | ICD-10-CM | POA: Insufficient documentation

## 2022-07-29 DIAGNOSIS — N838 Other noninflammatory disorders of ovary, fallopian tube and broad ligament: Secondary | ICD-10-CM | POA: Diagnosis not present

## 2022-07-29 DIAGNOSIS — I700 Atherosclerosis of aorta: Secondary | ICD-10-CM | POA: Diagnosis not present

## 2022-07-29 DIAGNOSIS — R197 Diarrhea, unspecified: Secondary | ICD-10-CM | POA: Diagnosis not present

## 2022-07-29 DIAGNOSIS — A0472 Enterocolitis due to Clostridium difficile, not specified as recurrent: Secondary | ICD-10-CM | POA: Insufficient documentation

## 2022-07-29 LAB — URINALYSIS, ROUTINE W REFLEX MICROSCOPIC
Bacteria, UA: NONE SEEN
Bilirubin Urine: NEGATIVE
Glucose, UA: NEGATIVE mg/dL
Hgb urine dipstick: NEGATIVE
Ketones, ur: NEGATIVE mg/dL
Leukocytes,Ua: NEGATIVE
Nitrite: NEGATIVE
Protein, ur: 30 mg/dL — AB
Specific Gravity, Urine: 1.024 (ref 1.005–1.030)
pH: 5 (ref 5.0–8.0)

## 2022-07-29 LAB — COMPREHENSIVE METABOLIC PANEL
ALT: 17 U/L (ref 0–44)
AST: 18 U/L (ref 15–41)
Albumin: 4.4 g/dL (ref 3.5–5.0)
Alkaline Phosphatase: 60 U/L (ref 38–126)
Anion gap: 11 (ref 5–15)
BUN: 20 mg/dL (ref 8–23)
CO2: 22 mmol/L (ref 22–32)
Calcium: 9.9 mg/dL (ref 8.9–10.3)
Chloride: 104 mmol/L (ref 98–111)
Creatinine, Ser: 0.94 mg/dL (ref 0.44–1.00)
GFR, Estimated: 59 mL/min — ABNORMAL LOW (ref >60)
Glucose, Bld: 130 mg/dL — ABNORMAL HIGH (ref 70–99)
Potassium: 4.1 mmol/L (ref 3.5–5.1)
Sodium: 137 mmol/L (ref 135–145)
Total Bilirubin: 1.1 mg/dL (ref 0.3–1.2)
Total Protein: 7.6 g/dL (ref 6.5–8.1)

## 2022-07-29 LAB — CBC
HCT: 48.7 % — ABNORMAL HIGH (ref 36.0–46.0)
Hemoglobin: 16.4 g/dL — ABNORMAL HIGH (ref 12.0–15.0)
MCH: 30.2 pg (ref 26.0–34.0)
MCHC: 33.7 g/dL (ref 30.0–36.0)
MCV: 89.7 fL (ref 80.0–100.0)
Platelets: 231 10*3/uL (ref 150–400)
RBC: 5.43 MIL/uL — ABNORMAL HIGH (ref 3.87–5.11)
RDW: 13.6 % (ref 11.5–15.5)
WBC: 8.7 10*3/uL (ref 4.0–10.5)
nRBC: 0 % (ref 0.0–0.2)

## 2022-07-29 LAB — LIPASE, BLOOD: Lipase: 39 U/L (ref 11–51)

## 2022-07-29 LAB — C DIFFICILE QUICK SCREEN W PCR REFLEX
C Diff antigen: POSITIVE — AB
C Diff toxin: NEGATIVE

## 2022-07-29 LAB — CLOSTRIDIUM DIFFICILE BY PCR, REFLEXED: Toxigenic C. Difficile by PCR: POSITIVE — AB

## 2022-07-29 LAB — MAGNESIUM: Magnesium: 2.1 mg/dL (ref 1.7–2.4)

## 2022-07-29 MED ORDER — AMOXICILLIN-POT CLAVULANATE 875-125 MG PO TABS: 1.0000 | ORAL_TABLET | Freq: Two times a day (BID) | ORAL | 0 refills | Status: DC

## 2022-07-29 MED ORDER — AMOXICILLIN-POT CLAVULANATE 875-125 MG PO TABS
1.0000 | ORAL_TABLET | Freq: Once | ORAL | Status: AC
  Administered 2022-07-29: 1 via ORAL
  Filled 2022-07-29: qty 1

## 2022-07-29 MED ORDER — FIDAXOMICIN 200 MG PO TABS: 200.0000 mg | ORAL_TABLET | Freq: Two times a day (BID) | ORAL | 0 refills | Status: AC

## 2022-07-29 MED ORDER — IOHEXOL 350 MG/ML SOLN
75.0000 mL | Freq: Once | INTRAVENOUS | Status: AC | PRN
  Administered 2022-07-29: 75 mL via INTRAVENOUS

## 2022-07-29 MED ORDER — ONDANSETRON 4 MG PO TBDP
4.0000 mg | ORAL_TABLET | Freq: Once | ORAL | Status: AC
  Administered 2022-07-29: 4 mg via ORAL
  Filled 2022-07-29: qty 1

## 2022-07-29 MED ORDER — LACTATED RINGERS IV BOLUS
1000.0000 mL | Freq: Once | INTRAVENOUS | Status: AC
  Administered 2022-07-29: 1000 mL via INTRAVENOUS

## 2022-07-29 NOTE — ED Notes (Signed)
Pt wheeled to waiting room. Pt verbalized understanding of discharge instructions.   

## 2022-07-29 NOTE — Discharge Instructions (Signed)
Your CT scan shows an extension of your colon called diverticulitis.  You are being treated with antibiotics for this.  Your CT scan also shows that your left ovary is larger than the last time we saw it in 2015.  This could be a cyst versus a cancer.  It is important to follow-up with OB/GYN and you have been referred to 1 if you do not have 1.  If you develop new or worsening abdominal pain, blood in your stool, fever, vomiting, or any other new/concerning symptoms and return to the ER or call 911.

## 2022-07-29 NOTE — ED Provider Notes (Addendum)
[place] EMERGENCY DEPARTMENT AT [facility] Provider Note   CSN: [id number] Arrival date & time: 07/29/22  0754     History  Chief Complaint  Patient presents with   Abdominal Pain   Diarrhea   Nausea    Debra [name] is a 88 y.o. female.  HPI 88 year old female resents with diarrhea.  Patient's been having some nausea for couple days and then starting last night around 6 PM has had significant amounts of diarrhea.  She is going every 20 minutes or so.  There has been no blood.  She gets cramping abdominal pain that gets worse just prior to diarrhea and then seems to get better.  Right now she has minimal pain.  No back pain, chest pain, shortness of breath or fever.  No recent antibiotics or travel.  No sick contacts.  Home Medications Prior to Admission medications   Medication Sig Start Date End Date Taking? Authorizing Provider  amoxicillin-clavulanate (AUGMENTIN) 875-125 MG tablet Take 1 tablet by mouth every 12 (twelve) hours. 07/29/22  Yes Pricilla Loveless, MD  acetaminophen (TYLENOL) 325 MG tablet Take 650 mg by mouth every 6 (six) hours as needed.    [provider]  amLODipine (NORVASC) 10 MG tablet TAKE ONE TABLET BY MOUTH ONCE DAILY (PLEASE KEEP ANNUAL VISIT FOR FUTURE REFILLS) 07/10/22   Donita Brooks, MD  atenolol (TENORMIN) 50 MG tablet TAKE ONE TABLET BY MOUTH ONCE A DAY (PLEASE KEEP ANNUAL VISIT FOR FUTURE REFILLS) 07/08/22   Donita Brooks, MD  brimonidine (ALPHAGAN) 0.2 % ophthalmic solution Place 1 drop into both eyes 2 (two) times daily. 02/17/11   [provider]  cholecalciferol (VITAMIN D3) 25 MCG (1000 UNIT) tablet Take 1,000 Units by mouth daily.    [provider]  cloNIDine (CATAPRES) 0.1 MG tablet TAKE 1 TABLET BY MOUTH IN THE MORNING AND 2 TABLETS BY MOUTH AT BEDTIME 02/02/22   Donita Brooks, MD  furosemide (LASIX) 40 MG tablet TAKE 1 TABLET BY MOUTH ONCE DAILY AS NEEDED 03/06/19   [place], Velna Hatchet, MD   latanoprost (XALATAN) 0.005 % ophthalmic solution Place 1 drop into both eyes at bedtime. 02/17/11   [provider]  lisinopril (ZESTRIL) 40 MG tablet TAKE 1 TABLET BY MOUTH DAILY 03/13/22   Donita Brooks, MD  pravastatin (PRAVACHOL) 80 MG tablet TAKE 1 TABLET BY MOUTH ONCE A DAY 04/13/22   Donita Brooks, MD  predniSONE (DELTASONE) 1 MG tablet Take 4 tablets (4 mg total) by mouth daily with breakfast. 04/30/22   Donita Brooks, MD  timolol (TIMOPTIC) 0.5 % ophthalmic solution Place 1 drop into both eyes 2 (two) times daily. 06/30/12   [provider]  traMADol (ULTRAM) 50 MG tablet Take 50 mg by mouth every 6 (six) hours as needed.    [provider]  vitamin B-12 (CYANOCOBALAMIN) 100 MCG tablet Take 100 mcg by mouth daily. Patient not taking: Reported on 07/16/2022    [provider]      Allergies    Codeine phosphate, Lipitor [atorvastatin], Pneumococcal vaccines, Sulfa antibiotics, and Latex    Review of Systems   Review of Systems  Respiratory:  Negative for shortness of breath.   Gastrointestinal:  Positive for abdominal pain, diarrhea and nausea. Negative for blood in stool and vomiting.  Genitourinary:  Negative for dysuria.    Physical Exam Updated Vital Signs BP (!) 165/73 (BP Location: Right Arm)   Pulse 90   Temp 98.2 F (  36.8 C) (Oral)   Resp 20   SpO2 100%  Physical Exam Vitals and nursing note reviewed.  Constitutional:      General: She is not in acute distress.    Appearance: She is well-developed. She is not diaphoretic.  HENT:     Head: Normocephalic and atraumatic.  Cardiovascular:     Rate and Rhythm: Regular rhythm. Tachycardia present.     Heart sounds: Normal heart sounds.  Pulmonary:     Effort: Pulmonary effort is normal.     Breath sounds: Normal breath sounds.  Abdominal:     Palpations: Abdomen is soft.     Tenderness: There is abdominal tenderness in the left lower quadrant.  Skin:    General: Skin  is warm and dry.  Neurological:     Mental Status: She is alert.     ED Results / Procedures / Treatments   Labs (all labs ordered are listed, but only abnormal results are displayed) Labs Reviewed  COMPREHENSIVE METABOLIC PANEL - Abnormal; Notable for the following components:      Result Value   Glucose, Bld 130 (*)    GFR, Estimated 59 (*)    All other components within normal limits  CBC - Abnormal; Notable for the following components:   RBC 5.43 (*)    Hemoglobin 16.4 (*)    HCT 48.7 (*)    All other components within normal limits  URINALYSIS, ROUTINE W REFLEX MICROSCOPIC - Abnormal; Notable for the following components:   Color, Urine AMBER (*)    Protein, ur 30 (*)    All other components within normal limits  GASTROINTESTINAL PANEL BY PCR, STOOL (REPLACES STOOL CULTURE)  C DIFFICILE QUICK SCREEN W PCR REFLEX    LIPASE, BLOOD  MAGNESIUM    EKG None  Radiology CT ABDOMEN PELVIS W CONTRAST  Result Date: 07/29/2022 CLINICAL DATA:  Left lower quadrant abdominal pain. History of left adnexal mass and exploratory laparotomy 02/06/2014. EXAM: CT ABDOMEN AND PELVIS WITH CONTRAST TECHNIQUE: Multidetector CT imaging of the abdomen and pelvis was performed using the standard protocol following bolus administration of intravenous contrast. RADIATION DOSE REDUCTION: This exam was performed according to the departmental dose-optimization program which includes automated exposure control, adjustment of the mA and/or kV according to patient size and/or use of iterative reconstruction technique. CONTRAST:  75mL OMNIPAQUE IOHEXOL 350 MG/ML SOLN COMPARISON:  Abdominopelvic CT 03/01/2014.  Lumbar MRI 11/02/2020. FINDINGS: Lower chest: Mild scarring at both lung bases, similar to previous study. No confluent airspace disease or significant pleural effusion. Aortic atherosclerosis and mild cardiomegaly noted. Hepatobiliary: The liver is normal in density without suspicious focal abnormality.  There are scattered low-density lesions in the left lobe which are unchanged and consistent with cysts based on stability. A large calcified gallstone appears unchanged. No evidence of gallbladder wall thickening, surrounding inflammation or biliary ductal dilatation. Pancreas: Unremarkable. No pancreatic ductal dilatation or surrounding inflammatory changes. Spleen: Normal in size without focal abnormality. Adrenals/Urinary Tract: Both adrenal glands appear normal. No evidence of urinary tract calculus, suspicious renal lesion or hydronephrosis. The bladder appears unremarkable for its degree of distention. Stomach/Bowel: No enteric contrast administered. Stable small hiatal hernia. The stomach otherwise appears unremarkable for its degree of distention. The small bowel and proximal colon appear unremarkable. The appendix is not visualized and may be surgically absent. There are diverticular changes within the descending and sigmoid colon. New wall thickening within the mid to distal sigmoid colon with surrounding inflammation, suspicious for diverticulitis. No evidence  of bowel obstruction or perforation. Vascular/Lymphatic: There are no enlarged abdominal or pelvic lymph nodes. Aortic and branch vessel atherosclerosis without evidence of aneurysm or large vessel occlusion. Reproductive: Status post hysterectomy. A previously demonstrated left adnexal mass has enlarged significantly since the prior CT of 2015. This measures approximately 9.9 x 9.5 x 12.9 cm (previously 6.8 x 4.6 x 5.0 cm, remeasured). Small calcifications along the lateral aspect of this lesion are unchanged. No solid components are identified. Although near the suspected acute sigmoid diverticulitis, this appears unrelated. Other: As above, mild pelvic inflammatory changes with a small amount of free pelvic fluid. No other focal fluid collections or pneumoperitoneum. Postsurgical changes in the anterior abdominal wall. Musculoskeletal: No acute  or significant osseous findings. Multilevel spondylosis with chronic degenerative anterolisthesis and mild foraminal narrowing bilaterally at L4-5. IMPRESSION: 1. New wall thickening within the mid to distal sigmoid colon with surrounding inflammation, suspicious for acute diverticulitis. No evidence of bowel obstruction, perforation or abscess. 2. Interval enlargement of previously demonstrated left adnexal mass since 2015, now measuring up to 12.9 cm in diameter. This appears unrelated to the patient's acute symptoms, and could reflect a low-grade ovarian neoplasm or peritoneal inclusion cyst. Recommend gynecologic follow-up. 3. Cholelithiasis without evidence of cholecystitis or biliary ductal dilatation. 4.  Aortic Atherosclerosis (ICD10-I70.0). Electronically Signed   By: Carey Bullocks M.D.   On: 07/29/2022 10:27    Procedures Procedures    Medications Ordered in ED Medications  amoxicillin-clavulanate (AUGMENTIN) 875-125 MG per tablet 1 tablet (has no administration in time range)  ondansetron (ZOFRAN-ODT) disintegrating tablet 4 mg (4 mg Oral Given 07/29/22 0818)  lactated ringers bolus 1,000 mL (1,000 mLs Intravenous New Bag/Given 07/29/22 0907)  iohexol (OMNIPAQUE) 350 MG/ML injection 75 mL (75 mLs Intravenous Contrast Given 07/29/22 0951)    ED Course/ Medical Decision Making/ A&P                             Medical Decision Making Amount and/or Complexity of Data Reviewed Labs: ordered.    Details: Normal WBC, unremarkable electrolytes and creatinine. Radiology: ordered and independent interpretation performed.    Details: Large ovarian mass as well as some diverticulitis.  Risk Prescription drug management.   Patient has some mild lower abdominal tenderness on exam though is not complaining of abdominal pain currently.  The diarrhea seems of slowed down now that she is in the ER.  No vomiting at this time and feels better with some Zofran and did not require any pain meds.  CT  does show some diverticulitis in addition to this ovarian mass.  However the mass has been there for at least 9 years and patient states she has known about and actually had surgery but they could not find the mass.  What ever it is has been slow-growing and I will refer her back to OB/GYN.  She and family are aware of it.  Otherwise, will start on Augmentin and she feels well enough for discharge and her vital signs are much better including a normal heart rate.  Will discharge home with return precautions.  Follow-up with PCP.   1:28 PM I was made aware that the patient's C diff test was positive. Discussed with Dr. Ninetta Lights, given she's being started on Augmentin will do 2 weeks of Fidaxomicin. I updated her daughter, Alcide Evener, over the phone with the plan (patient herself did not answer her phone). Instructed to follow up closely with PCP.  Final Clinical Impression(s) / ED Diagnoses Final diagnoses:  Acute diverticulitis  Ovarian mass, left  C. difficile diarrhea    Rx / DC Orders ED Discharge Orders          Ordered    amoxicillin-clavulanate (AUGMENTIN) 875-125 MG tablet  Every 12 hours        07/29/22 1126              Pricilla Loveless, MD 07/29/22 1134    Pricilla Loveless, MD 07/29/22 1330

## 2022-07-29 NOTE — ED Triage Notes (Signed)
Pt. Stated, For 2-3 nights Ive felt sick to my stomach  and yesterday I started running to the bathroom every few minutes. Been nausea but not throwing up.

## 2022-07-30 LAB — GASTROINTESTINAL PANEL BY PCR, STOOL (REPLACES STOOL CULTURE)
Entamoeba histolytica: NOT DETECTED
Plesimonas shigelloides: NOT DETECTED
Sapovirus (I, II, IV, and V): NOT DETECTED
Shigella/Enteroinvasive E coli (EIEC): NOT DETECTED
Vibrio species: NOT DETECTED

## 2022-07-31 ENCOUNTER — Ambulatory Visit: Payer: PPO | Admitting: Family Medicine

## 2022-07-31 LAB — GASTROINTESTINAL PANEL BY PCR, STOOL (REPLACES STOOL CULTURE)
Adenovirus F40/41: NOT DETECTED
Astrovirus: NOT DETECTED
Campylobacter species: NOT DETECTED
Cryptosporidium: NOT DETECTED
Cyclospora cayetanensis: NOT DETECTED
Enteroaggregative E coli (EAEC): NOT DETECTED
Enteropathogenic E coli (EPEC): DETECTED — AB
Enterotoxigenic E coli (ETEC): NOT DETECTED
Giardia lamblia: NOT DETECTED
Norovirus GI/GII: NOT DETECTED
Rotavirus A: NOT DETECTED
Salmonella species: NOT DETECTED
Shiga like toxin producing E coli (STEC): NOT DETECTED
Vibrio cholerae: NOT DETECTED
Yersinia enterocolitica: NOT DETECTED

## 2022-08-07 ENCOUNTER — Other Ambulatory Visit: Payer: Self-pay | Admitting: Family Medicine

## 2022-08-07 ENCOUNTER — Other Ambulatory Visit: Payer: Self-pay

## 2022-08-07 DIAGNOSIS — E7800 Pure hypercholesterolemia, unspecified: Secondary | ICD-10-CM

## 2022-08-07 MED ORDER — PRAVASTATIN SODIUM 80 MG PO TABS
80.0000 mg | ORAL_TABLET | Freq: Every day | ORAL | 1 refills | Status: DC
Start: 2022-08-07 — End: 2022-08-07

## 2022-08-08 ENCOUNTER — Other Ambulatory Visit (HOSPITAL_COMMUNITY): Payer: Self-pay

## 2022-08-24 MED ORDER — LIDOCAINE HCL (PF) 1 % IJ SOLN
2.0000 mL | Freq: Once | INTRAMUSCULAR | Status: DC
Start: 2022-08-24 — End: 2022-08-28

## 2022-08-24 MED ORDER — TRIAMCINOLONE ACETONIDE 40 MG/ML IJ SUSP
80.0000 mg | Freq: Once | INTRAMUSCULAR | Status: DC
Start: 2022-08-24 — End: 2022-08-28

## 2022-08-24 MED ORDER — BUPIVACAINE HCL 0.25 % IJ SOLN
2.0000 mL | Freq: Once | INTRAMUSCULAR | Status: DC
Start: 2022-08-24 — End: 2022-08-28

## 2022-08-24 NOTE — Addendum Note (Signed)
Addended by: Darral Dash on: 08/24/2022 09:37 AM   Modules accepted: Orders

## 2022-08-28 MED ORDER — TRIAMCINOLONE ACETONIDE 40 MG/ML IJ SUSP
40.0000 mg | Freq: Once | INTRAMUSCULAR | Status: AC
Start: 2022-08-28 — End: 2022-08-28
  Administered 2022-08-28: 40 mg via INTRA_ARTICULAR

## 2022-08-28 MED ORDER — LIDOCAINE HCL (PF) 1 % IJ SOLN
2.0000 mL | Freq: Once | INTRAMUSCULAR | Status: AC
  Administered 2022-08-28: 2 mL

## 2022-08-28 MED ORDER — BUPIVACAINE HCL 0.25 % IJ SOLN
2.0000 mL | Freq: Once | INTRAMUSCULAR | Status: AC
Start: 2022-08-28 — End: 2022-08-28
  Administered 2022-08-28: 2 mL via INTRA_ARTICULAR

## 2022-08-28 NOTE — Addendum Note (Signed)
Addended by: Venia Carbon K on: 08/28/2022 05:31 PM   Modules accepted: Orders

## 2022-09-10 ENCOUNTER — Other Ambulatory Visit: Payer: Self-pay | Admitting: Family Medicine

## 2022-09-10 ENCOUNTER — Ambulatory Visit (INDEPENDENT_AMBULATORY_CARE_PROVIDER_SITE_OTHER): Payer: PPO | Admitting: Family Medicine

## 2022-09-10 ENCOUNTER — Encounter: Payer: Self-pay | Admitting: Family Medicine

## 2022-09-10 VITALS — BP 120/62 | HR 76 | Temp 97.9°F | Ht 63.0 in | Wt 164.0 lb

## 2022-09-10 DIAGNOSIS — G8929 Other chronic pain: Secondary | ICD-10-CM | POA: Diagnosis not present

## 2022-09-10 DIAGNOSIS — M25511 Pain in right shoulder: Secondary | ICD-10-CM | POA: Diagnosis not present

## 2022-09-10 DIAGNOSIS — I10 Essential (primary) hypertension: Secondary | ICD-10-CM | POA: Diagnosis not present

## 2022-09-10 DIAGNOSIS — M353 Polymyalgia rheumatica: Secondary | ICD-10-CM

## 2022-09-10 DIAGNOSIS — N838 Other noninflammatory disorders of ovary, fallopian tube and broad ligament: Secondary | ICD-10-CM

## 2022-09-10 MED ORDER — LIDOCAINE HCL (PF) 1 % IJ SOLN
2.0000 mL | Freq: Once | INTRAMUSCULAR | Status: AC
  Administered 2022-09-10: 2 mL

## 2022-09-10 MED ORDER — TRIAMCINOLONE ACETONIDE 40 MG/ML IJ SUSP
40.0000 mg | Freq: Once | INTRAMUSCULAR | Status: AC
Start: 2022-09-10 — End: 2022-09-10
  Administered 2022-09-10: 40 mg via INTRA_ARTICULAR

## 2022-09-10 MED ORDER — BUPIVACAINE HCL 0.25 % IJ SOLN
2.0000 mL | Freq: Once | INTRAMUSCULAR | Status: AC
Start: 2022-09-10 — End: 2022-09-10
  Administered 2022-09-10: 2 mL via INTRA_ARTICULAR

## 2022-09-10 NOTE — Progress Notes (Signed)
Subjective:    Patient ID: Debra [name], female    DOB: [birth year], 88 y.o.   MRN: [id number]    07/16/22 Patient is a very sweet 88 year old Caucasian female  who is here for follow-up of her chronic medical conditions.  She has a history of PMR.  She is supposed to be taking 4 mg a day of prednisone however recently she increased the dose to 6 mg.  She complains of bilateral shoulder pain left greater than right.  She states that in the morning her shoulders hurt so bad that she cannot even drink her coffee.  After she takes the prednisone, the pain improves and she can go about her day however the pain is always there.  She is reporting pain with even passive range of motion in both shoulders today.  She denies any pain in her hips.  The pain radiates from shoulder to shoulder and also includes her upper neck.  She denies any numbness or tingling radiating down her arms or burning or stinging pain radiating down her arms.  She does have diminished strength in her legs.  She ambulates with a walker she denies any pain in her knees.  She does complain of arthritic pain in her fingers.  Her blood pressure today is excellent.  At that time, my plan was: Patient seems to be doing relatively well on 4 to 6 mg a day of prednisone.  However I do not know how much of the pain in her shoulders is from PMR and how much of the pain in her shoulders is due to arthritis in the shoulders or even rotator cuff tendinitis.  The patient would like to try cortisone injection today in her left shoulder.  Using sterile technique, I injected the left shoulder with 2 cc of lidocaine, 2 cc of Marcaine, 2 cc of 40 mg/mL Kenalog.  The patient tolerated the procedure well.  If she sees significant benefit with regards to the pain in her shoulders after this we could repeat the injection in her right shoulder.  If the pain does not improve at all after the cortisone injection, I would attribute this to PMR and we may need to  uptitrate her dosage from 6 mg to a higher dose.  Check CBC CMP and a lipid panel to monitor cholesterol along with renal function  09/10/22 First, the patient saw significant benefit from the cortisone shot shoulder.  She was able to wean herself off prednisone and has been off prednisone for more than 3 weeks.  She is now requesting that I injected her right shoulder as this is still giving her significant pain.  She primarily reports pain with abduction above her head.  Most of her symptoms sound more consistent with impingement syndrome rather than PMR.  Second, since I last saw the patient, she went to the hospital May 29 and was diagnosed with diverticulitis.  A GI pathogen panel showed the patient was positive for C. difficile colitis as well as E. coli.  She was discharged home on several weeks of vancomycin.  Patient has since completed the vancomycin.  She states that she is feeling better.  She still has very little appetite.  When she does eat she feels nauseated.  When she does eat she feels nauseated however the last few days that has improved.  She has yet to make an appointment to see the gynecologist.  CT scan showed that the mass on her ovary had increased from 2015 and  now is roughly doubled the size.  She denies any pelvic pain.  She does report feeling weak and tired.  It is worse every morning after she takes her blood pressure medication.  She is currently on atenolol, amlodipine, and clonidine.  She states that after she takes her medication she feels tired and weak   Past Medical History:  Diagnosis Date   Arthritis    [employer] 07/02/2020   Brain mass    monitor by Dr Dutch Quint - right posterior fossa meningioma - thinks it's benign per patient   Cancer (HCC)    hand, face - spots removed   Constipation    Degeneration of lumbar or lumbosacral intervertebral disc    arthritis in back, hips and hands - otc med prn   Diarrhea    Diverticulosis of colon with hemorrhage     Dysrhythmia    Hx irregular heart beat - 15 yrs ago   Female stress incontinence    Glaucoma    [employer] 07/02/2020   Headache    otc med prn   Hypertension    Lumbago    Missed abortion    x 2 - no surgery required   Paroxysmal supraventricular tachycardia    Hx   PONV (postoperative nausea and vomiting)    Pure hypercholesterolemia    SVD (spontaneous vaginal delivery)    x 4   Unspecified glaucoma(365.9)    bilateral   Past Surgical History:  Procedure Laterality Date   ABDOMINAL HYSTERECTOMY     APPENDECTOMY     COLONOSCOPY     EYE SURGERY     bilateral cataract eye surgery   EYE SURGERY     laser to relieve eye pressure - bilateral   LAPAROTOMY N/A 02/06/2014   Procedure: EXPLORATORY LAPAROTOMY;  Surgeon: Willodean Rosenthal, MD;  Location: [facility];  Service: Gynecology;  Laterality: N/A;   MULTIPLE TOOTH EXTRACTIONS     upper   right hand surgery     ganglion cyst removed   TUBAL LIGATION     Current Outpatient Medications on File Prior to Visit  Medication Sig Dispense Refill   acetaminophen (TYLENOL) 325 MG tablet Take 650 mg by mouth every 6 (six) hours as needed.     amLODipine (NORVASC) 10 MG tablet TAKE ONE TABLET BY MOUTH ONCE DAILY (PLEASE KEEP ANNUAL VISIT FOR FUTURE REFILLS) 60 tablet 0   amoxicillin-clavulanate (AUGMENTIN) 875-125 MG tablet Take 1 tablet by mouth every 12 (twelve) hours. 13 tablet 0   atenolol (TENORMIN) 50 MG tablet TAKE ONE TABLET BY MOUTH ONCE A DAY (PLEASE KEEP ANNUAL VISIT FOR FUTURE REFILLS) 90 tablet 3   brimonidine (ALPHAGAN) 0.2 % ophthalmic solution Place 1 drop into both eyes 2 (two) times daily.     cholecalciferol (VITAMIN D3) 25 MCG (1000 UNIT) tablet Take 1,000 Units by mouth daily.     cloNIDine (CATAPRES) 0.1 MG tablet TAKE 1 TABLET BY MOUTH IN THE MORNING AND 2 TABLETS BY MOUTH AT BEDTIME 270 tablet 3   furosemide (LASIX) 40 MG tablet TAKE 1 TABLET BY MOUTH ONCE DAILY AS NEEDED 90 tablet 1   latanoprost (XALATAN)  0.005 % ophthalmic solution Place 1 drop into both eyes at bedtime.     lisinopril (ZESTRIL) 40 MG tablet TAKE 1 TABLET BY MOUTH DAILY 90 tablet 3   pravastatin (PRAVACHOL) 80 MG tablet TAKE ONE TABLET BY MOUTH ONCE A DAY (PLEASE KEEP ANNUAL VISIT FOR FUTURE REFILLS) 90 tablet 0   predniSONE (DELTASONE) 1 MG  tablet Take 4 tablets (4 mg total) by mouth daily with breakfast. 120 tablet 3   timolol (TIMOPTIC) 0.5 % ophthalmic solution Place 1 drop into both eyes 2 (two) times daily.     traMADol (ULTRAM) 50 MG tablet Take 50 mg by mouth every 6 (six) hours as needed.     vitamin B-12 (CYANOCOBALAMIN) 100 MCG tablet Take 100 mcg by mouth daily. (Patient not taking: Reported on 07/16/2022)     No current facility-administered medications on file prior to visit.   Allergies  Allergen Reactions   Codeine Phosphate Nausea And Vomiting    REACTION: unspecified   Lipitor [Atorvastatin]    Pneumococcal Vaccines Hives   Sulfa Antibiotics     unknown   Latex Hives, Itching and Rash   Social History   Socioeconomic History   Marital status: Widowed    Spouse name: Not on file   Number of children: 4   Years of education: Not on file   Highest education level: Not on file  Occupational History   Occupation: raised tobacco    Employer: Retired  Tobacco Use   Smoking status: Never   Smokeless tobacco: Never  Vaping Use   Vaping status: Never Used  Substance and Sexual Activity   Alcohol use: No   Drug use: No   Sexual activity: Not Currently    Birth control/protection: None, Post-menopausal, Surgical  Other Topics Concern   Not on file  Social History Narrative    Has living will.  Daughter is HCPOA, Lenice Llamas.Full code.   3 sons and 1 daughter.   8 grandchildren   5 great grandchildren   Social Determinants of Health   Financial Resource Strain: Low Risk  (05/14/2022)   Overall Financial Resource Strain (CARDIA)    Difficulty of Paying Living Expenses: Not hard at all  Food  Insecurity: No Food Insecurity (05/14/2022)   Hunger Vital Sign    Worried About Running Out of Food in the Last Year: Never true    Ran Out of Food in the Last Year: Never true  Transportation Needs: No Transportation Needs (05/14/2022)   PRAPARE - Administrator, Civil Service (Medical): No    Lack of Transportation (Non-Medical): No  Physical Activity: Insufficiently Active (05/14/2022)   Exercise Vital Sign    Days of Exercise per Week: 3 days    Minutes of Exercise per Session: 30 min  Stress: No Stress Concern Present (05/14/2022)   [employer] of Occupational Health - Occupational Stress Questionnaire    Feeling of Stress : Not at all  Social Connections: Moderately Integrated (05/14/2022)   Social Connection and Isolation Panel [NHANES]    Frequency of Communication with Friends and Family: More than three times a week    Frequency of Social Gatherings with Friends and Family: Three times a week    Attends Religious Services: More than 4 times per year    Active Member of Clubs or Organizations: Yes    Attends Banker Meetings: More than 4 times per year    Marital Status: Widowed  Intimate Partner Violence: Not At Risk (05/14/2022)   Humiliation, Afraid, Rape, and Kick questionnaire    Fear of Current or Ex-Partner: No    Emotionally Abused: No    Physically Abused: No    Sexually Abused: No     Review of Systems     Objective:   Physical Exam Vitals reviewed.  Constitutional:      Appearance: Normal  appearance.  Cardiovascular:     Rate and Rhythm: Normal rate and regular rhythm.     Heart sounds: Normal heart sounds. No murmur heard.    No friction rub. No gallop.  Pulmonary:     Effort: Pulmonary effort is normal.     Breath sounds: Normal breath sounds.  Musculoskeletal:     Right lower leg: No edema.     Left lower leg: No edema.  Neurological:     Mental Status: She is alert.           Assessment & Plan:  Essential  hypertension, benign  PMR (polymyalgia rheumatica) (HCC)  Chronic right shoulder pain  Mass, ovarian I believe the majority of the patient's shoulder pain is likely due to impingement syndrome and not PMR.  Using sterile technique, I injected the right subacromial space with 2 cc of lidocaine, 2 cc of Marcaine, and 2 cc of 40 mg/mL Kenalog.  The patient tolerated the procedure well.  Stay off prednisone.  She has been off for several weeks now and is showing no evidence of adrenal insufficiency.  I am concerned that due to the recent illness that has been prolonged, her blood pressure is relatively low and this is causing her symptoms after she takes her medicine in the morning.  Decrease clonidine to one half a tablet twice a day.  Recheck in 2 weeks and plan to discontinue clonidine altogether at that point.  Recommended that they schedule an appointment with a gynecologist.  They have the contact information.

## 2022-09-22 ENCOUNTER — Encounter: Payer: Self-pay | Admitting: Obstetrics and Gynecology

## 2022-09-22 ENCOUNTER — Other Ambulatory Visit: Payer: Self-pay

## 2022-09-22 ENCOUNTER — Ambulatory Visit: Payer: PPO | Admitting: Obstetrics and Gynecology

## 2022-09-22 VITALS — BP 132/85 | HR 77 | Wt 161.6 lb

## 2022-09-22 DIAGNOSIS — N9489 Other specified conditions associated with female genital organs and menstrual cycle: Secondary | ICD-10-CM

## 2022-09-22 NOTE — Progress Notes (Signed)
NEW GYNECOLOGY VISIT  Subjective:  Debra [name] is a 88 y.o. menopausal F s/p hysterectomy presenting for follow up of L adnexal mass.   Seen in ED 07/29/22 for LLQ pain, ultimately dx w/ diverticulitis. During w/u, CT A/P revealed interval enlargement of a L adnexal mass measuring 9.9 x 9.5 x 12.9cm with stable calcifications along the lateral aspect of the lesion, no solid components. Did not appear to be related to her acute diverticulitis. She was discharged with po abx and gyn follow up.   Of note, this area of question has been present since at least 2008. Has been progressively increasing in size (3.9cm 2008 > 6.7cm 10/2013 > 12.9cm 07/2022). She is s/p exploratory laparotomy with Dr. Erin Fulling on 02/06/14 - noted pelvic adhesions with fluid between the adhesions on the left side but no masses. Left ovary was not visualized or palpable. CT on 01/2014 after surgery showed persistence of cystic mass as previously visualized.   Today, she denies pain, discomfort, fevers/chills, WL. Eating normally.  This is a chronic problem that is now progressing on imaging.   I personally reviewed the following: - 08/24/13 CA-125 7.3 - 08/06/13 CT A/P - 11/24/13 pelvic US - 02/06/14 Op note Dr. Erin Fulling - pelvic adhesions w/ fluid between adhesions but no masses. L ovary nonpalpable/not visualized - 03/01/14 CT A/P - 07/29/22 ED note by Dr. Criss Alvine - 07/29/22 CT A/P as outlined above  Past Medical History:  Diagnosis Date   Arthritis    Phreesia 07/02/2020   Brain mass    monitor by Dr Dutch Quint - right posterior fossa meningioma - thinks it's benign per patient   Cancer (HCC)    hand, face - spots removed   Constipation    Degeneration of lumbar or lumbosacral intervertebral disc    arthritis in back, hips and hands - otc med prn   Diarrhea    Diverticulosis of colon with hemorrhage    Dysrhythmia    Hx irregular heart beat - 15 yrs ago   Female stress incontinence    Glaucoma     Phreesia 07/02/2020   Headache    otc med prn   Hypertension    Lumbago    Missed abortion    x 2 - no surgery required   Paroxysmal supraventricular tachycardia    Hx   PONV (postoperative nausea and vomiting)    Pure hypercholesterolemia    SVD (spontaneous vaginal delivery)    x 4   Unspecified glaucoma(365.9)    bilateral   Past Surgical History:  Procedure Laterality Date   ABDOMINAL HYSTERECTOMY     APPENDECTOMY     COLONOSCOPY     EYE SURGERY     bilateral cataract eye surgery   EYE SURGERY     laser to relieve eye pressure - bilateral   LAPAROTOMY N/A 02/06/2014   Procedure: EXPLORATORY LAPAROTOMY;  Surgeon: Willodean Rosenthal, MD;  Location: [facility];  Service: Gynecology;  Laterality: N/A;   MULTIPLE TOOTH EXTRACTIONS     upper   right hand surgery     ganglion cyst removed   TUBAL LIGATION     Current Outpatient Medications on File Prior to Visit  Medication Sig Dispense Refill   acetaminophen (TYLENOL) 325 MG tablet Take 650 mg by mouth every 6 (six) hours as needed.     amLODipine (NORVASC) 10 MG tablet TAKE ONE TABLET BY MOUTH ONCE DAILY (PLEASE KEEP ANNUAL VISIT FOR FUTURE REFILLS) 60 tablet 0   atenolol (TENORMIN)  50 MG tablet TAKE ONE TABLET BY MOUTH ONCE A DAY (PLEASE KEEP ANNUAL VISIT FOR FUTURE REFILLS) 90 tablet 3   brimonidine (ALPHAGAN) 0.2 % ophthalmic solution Place 1 drop into both eyes 2 (two) times daily.     cholecalciferol (VITAMIN D3) 25 MCG (1000 UNIT) tablet Take 1,000 Units by mouth daily.     cloNIDine (CATAPRES) 0.1 MG tablet TAKE 1 TABLET BY MOUTH IN THE MORNING AND 2 TABLETS BY MOUTH AT BEDTIME 270 tablet 3   furosemide (LASIX) 40 MG tablet TAKE 1 TABLET BY MOUTH ONCE DAILY AS NEEDED 90 tablet 1   latanoprost (XALATAN) 0.005 % ophthalmic solution Place 1 drop into both eyes at bedtime.     lisinopril (ZESTRIL) 40 MG tablet TAKE 1 TABLET BY MOUTH DAILY 90 tablet 3   pravastatin (PRAVACHOL) 80 MG tablet TAKE ONE TABLET BY MOUTH ONCE A  DAY (PLEASE KEEP ANNUAL VISIT FOR FUTURE REFILLS) 90 tablet 0   timolol (TIMOPTIC) 0.5 % ophthalmic solution Place 1 drop into both eyes 2 (two) times daily.     traMADol (ULTRAM) 50 MG tablet Take 50 mg by mouth every 6 (six) hours as needed.     predniSONE (DELTASONE) 1 MG tablet Take 4 tablets (4 mg total) by mouth daily with breakfast. (Patient not taking: Reported on 09/10/2022) 120 tablet 3   vancomycin (VANCOCIN) 125 MG capsule Take by mouth. (Patient not taking: Reported on 09/10/2022)     No current facility-administered medications on file prior to visit.   Objective:   Vitals:   09/22/22 1331  BP: 132/85  Pulse: 77  Weight: 161 lb 9.6 oz (73.3 kg)   General:  Alert, oriented and cooperative. Patient is in no acute distress.  Skin: Skin is warm and dry. No rash noted.   Cardiovascular: Normal heart rate noted  Respiratory: Normal respiratory effort, no problems with respiration noted  Abdomen: Soft, non-tender, non-distended   Pelvic: NEFG. Palpable, mobile cyst within left adnexa  Exam performed in the presence of a chaperone  Assessment and Plan:  Debra [name] is a 88 y.o. with enlarging adnexal mass  Adnexal mass Given intra-op findings in 2015, suspect this is peritoneal fluid trapped within adhesive disease. Reviewed w/ pt that surgery would be only option to definitively exclude/diagnose an ovarian cancer, but voiced concerns that risks/morbidity related to surgery may outweigh the benefit of definitive diagnosis. Pt is currently asymptomatic.  Will obtain CA125 and pelvic MRI. Suspect that pelvic US will not give adequate visualization & MRI had been recommended after last pelvic US In 2015.  -     CA 125 -     MR PELVIS W WO CONTRAST; Future  Return for review of labs and MRI.  Future Appointments  Date Time Provider Department Center  10/01/2022  2:30 PM Donita Brooks, MD [facility]  05/20/2023  8:30 AM [facility] VISIT [facility]    Lennart Pall, MD

## 2022-09-22 NOTE — Progress Notes (Signed)
Patient here to follow up on ovarian cyct. She denies any pelvic pain or discomfort.    An attempt was made to schedule pelvis MR but was told by scheduler from [facility] that they will get in contact with patient to schedule appointment.   Alesia Morin   09/22/22

## 2022-09-22 NOTE — Patient Instructions (Signed)
You will see your blood test results in the MyChart app. We will schedule a follow up appointment after your MRI

## 2022-09-22 NOTE — Addendum Note (Signed)
Addended by: Harvie Bridge on: 09/22/2022 02:37 PM   Modules accepted: Orders

## 2022-09-23 LAB — CA 125: Cancer Antigen (CA) 125: 11.3 U/mL (ref 0.0–38.1)

## 2022-10-01 ENCOUNTER — Encounter: Payer: Self-pay | Admitting: Family Medicine

## 2022-10-01 ENCOUNTER — Ambulatory Visit (INDEPENDENT_AMBULATORY_CARE_PROVIDER_SITE_OTHER): Payer: PPO | Admitting: Family Medicine

## 2022-10-01 VITALS — BP 118/70 | HR 63 | Temp 98.3°F | Ht 63.0 in | Wt 163.4 lb

## 2022-10-01 DIAGNOSIS — I10 Essential (primary) hypertension: Secondary | ICD-10-CM

## 2022-10-01 NOTE — Progress Notes (Signed)
Subjective:    Patient ID: Debra [name], female    DOB: 16-Nov-1934, 88 y.o.   MRN: [id number]    07/16/22 Patient is a very sweet 88 year old Caucasian female  who is here for follow-up of her chronic medical conditions.  She has a history of PMR.  She is supposed to be taking 4 mg a day of prednisone however recently she increased the dose to 6 mg.  She complains of bilateral shoulder pain left greater than right.  She states that in the morning her shoulders hurt so bad that she cannot even drink her coffee.  After she takes the prednisone, the pain improves and she can go about her day however the pain is always there.  She is reporting pain with even passive range of motion in both shoulders today.  She denies any pain in her hips.  The pain radiates from shoulder to shoulder and also includes her upper neck.  She denies any numbness or tingling radiating down her arms or burning or stinging pain radiating down her arms.  She does have diminished strength in her legs.  She ambulates with a walker she denies any pain in her knees.  She does complain of arthritic pain in her fingers.  Her blood pressure today is excellent.  At that time, my plan was: Patient seems to be doing relatively well on 4 to 6 mg a day of prednisone.  However I do not know how much of the pain in her shoulders is from PMR and how much of the pain in her shoulders is due to arthritis in the shoulders or even rotator cuff tendinitis.  The patient would like to try cortisone injection today in her left shoulder.  Using sterile technique, I injected the left shoulder with 2 cc of lidocaine, 2 cc of Marcaine, 2 cc of 40 mg/mL Kenalog.  The patient tolerated the procedure well.  If she sees significant benefit with regards to the pain in her shoulders after this we could repeat the injection in her right shoulder.  If the pain does not improve at all after the cortisone injection, I would attribute this to PMR and we may need to  uptitrate her dosage from 6 mg to a higher dose.  Check CBC CMP and a lipid panel to monitor cholesterol along with renal function  09/10/22 First, the patient saw significant benefit from the cortisone shot shoulder.  She was able to wean herself off prednisone and has been off prednisone for more than 3 weeks.  She is now requesting that I injected her right shoulder as this is still giving her significant pain.  She primarily reports pain with abduction above her head.  Most of her symptoms sound more consistent with impingement syndrome rather than PMR.  Second, since I last saw the patient, she went to the hospital May 29 and was diagnosed with diverticulitis.  A GI pathogen panel showed the patient was positive for C. difficile colitis as well as E. coli.  She was discharged home on several weeks of vancomycin.  Patient has since completed the vancomycin.  She states that she is feeling better.  She still has very little appetite.  When she does eat she feels nauseated.  When she does eat she feels nauseated however the last few days that has improved.  She has yet to make an appointment to see the gynecologist.  CT scan showed that the mass on her ovary had increased from 2015 and  now is roughly doubled the size.  She denies any pelvic pain.  She does report feeling weak and tired.  It is worse every morning after she takes her blood pressure medication.  She is currently on atenolol, amlodipine, and clonidine.  She states that after she takes her medication she feels tired and weak.  At that  time, my plan was: I believe the majority of the patient's shoulder pain is likely due to impingement syndrome and not PMR.  Using sterile technique, I injected the right subacromial space with 2 cc of lidocaine, 2 cc of Marcaine, and 2 cc of 40 mg/mL Kenalog.  The patient tolerated the procedure well.  Stay off prednisone.  She has been off for several weeks now and is showing no evidence of adrenal insufficiency.   I am concerned that due to the recent illness that has been prolonged, her blood pressure is relatively low and this is causing her symptoms after she takes her medicine in the morning.  Decrease clonidine to one half a tablet twice a day.  Recheck in 2 weeks and plan to discontinue clonidine altogether at that point.  Recommended that they schedule an appointment with a gynecologist.  They have the contact information.  10/01/22 Patient is now taking one half clonidine in the morning and one half clonidine in the evening.  Morning blood pressures are 140-160/80-95.  Afternoon blood pressures are very low and are 100-130/50-60   Past Medical History:  Diagnosis Date   Arthritis    Phreesia 07/02/2020   Brain mass    monitor by Dr Dutch Quint - right posterior fossa meningioma - thinks it's benign per patient   Cancer (HCC)    hand, face - spots removed   Constipation    Degeneration of lumbar or lumbosacral intervertebral disc    arthritis in back, hips and hands - otc med prn   Diarrhea    Diverticulosis of colon with hemorrhage    Dysrhythmia    Hx irregular heart beat - 15 yrs ago   Female stress incontinence    Glaucoma    Phreesia 07/02/2020   Headache    otc med prn   Hypertension    Lumbago    Missed abortion    x 2 - no surgery required   Paroxysmal supraventricular tachycardia    Hx   PONV (postoperative nausea and vomiting)    Pure hypercholesterolemia    SVD (spontaneous vaginal delivery)    x 4   Unspecified glaucoma(365.9)    bilateral   Past Surgical History:  Procedure Laterality Date   ABDOMINAL HYSTERECTOMY     APPENDECTOMY     COLONOSCOPY     EYE SURGERY     bilateral cataract eye surgery   EYE SURGERY     laser to relieve eye pressure - bilateral   LAPAROTOMY N/A 02/06/2014   Procedure: EXPLORATORY LAPAROTOMY;  Surgeon: Willodean Rosenthal, MD;  Location: [facility];  Service: Gynecology;  Laterality: N/A;   MULTIPLE TOOTH EXTRACTIONS     upper   right  hand surgery     ganglion cyst removed   TUBAL LIGATION     Current Outpatient Medications on File Prior to Visit  Medication Sig Dispense Refill   acetaminophen (TYLENOL) 325 MG tablet Take 650 mg by mouth every 6 (six) hours as needed.     amLODipine (NORVASC) 10 MG tablet TAKE ONE TABLET BY MOUTH ONCE DAILY (PLEASE KEEP ANNUAL VISIT FOR FUTURE REFILLS) 60 tablet 0  atenolol (TENORMIN) 50 MG tablet TAKE ONE TABLET BY MOUTH ONCE A DAY (PLEASE KEEP ANNUAL VISIT FOR FUTURE REFILLS) 90 tablet 3   brimonidine (ALPHAGAN) 0.2 % ophthalmic solution Place 1 drop into both eyes 2 (two) times daily.     cholecalciferol (VITAMIN D3) 25 MCG (1000 UNIT) tablet Take 1,000 Units by mouth daily.     cloNIDine (CATAPRES) 0.1 MG tablet TAKE 1 TABLET BY MOUTH IN THE MORNING AND 2 TABLETS BY MOUTH AT BEDTIME (Patient taking differently: Take 0.1 mg by mouth 2 (two) times daily. 1/2 tablet in AM and 1/2 at bedtime.) 270 tablet 3   latanoprost (XALATAN) 0.005 % ophthalmic solution Place 1 drop into both eyes at bedtime.     lisinopril (ZESTRIL) 40 MG tablet TAKE 1 TABLET BY MOUTH DAILY 90 tablet 3   pravastatin (PRAVACHOL) 80 MG tablet TAKE ONE TABLET BY MOUTH ONCE A DAY (PLEASE KEEP ANNUAL VISIT FOR FUTURE REFILLS) 90 tablet 0   timolol (TIMOPTIC) 0.5 % ophthalmic solution Place 1 drop into both eyes 2 (two) times daily.     traMADol (ULTRAM) 50 MG tablet Take 50 mg by mouth every 6 (six) hours as needed.     No current facility-administered medications on file prior to visit.   Allergies  Allergen Reactions   Codeine Phosphate Nausea And Vomiting    REACTION: unspecified   Lipitor [Atorvastatin]    Pneumococcal Vaccines Hives   Sulfa Antibiotics     unknown   Latex Hives, Itching and Rash   Social History   Socioeconomic History   Marital status: Widowed    Spouse name: Not on file   Number of children: 4   Years of education: Not on file   Highest education level: Not on file  Occupational  History   Occupation: raised tobacco    Employer: Retired  Tobacco Use   Smoking status: Never   Smokeless tobacco: Never  Vaping Use   Vaping status: Never Used  Substance and Sexual Activity   Alcohol use: No   Drug use: No   Sexual activity: Not Currently    Birth control/protection: None, Post-menopausal, Surgical  Other Topics Concern   Not on file  Social History Narrative    Has living will.  Daughter is HCPOA, Lenice Llamas.Full code.   3 sons and 1 daughter.   8 grandchildren   5 great grandchildren   Social Determinants of Health   Financial Resource Strain: Low Risk  (05/14/2022)   Overall Financial Resource Strain (CARDIA)    Difficulty of Paying Living Expenses: Not hard at all  Food Insecurity: No Food Insecurity (05/14/2022)   Hunger Vital Sign    Worried About Running Out of Food in the Last Year: Never true    Ran Out of Food in the Last Year: Never true  Transportation Needs: No Transportation Needs (05/14/2022)   PRAPARE - Administrator, Civil Service (Medical): No    Lack of Transportation (Non-Medical): No  Physical Activity: Insufficiently Active (05/14/2022)   Exercise Vital Sign    Days of Exercise per Week: 3 days    Minutes of Exercise per Session: 30 min  Stress: No Stress Concern Present (05/14/2022)   [employer] of Occupational Health - Occupational Stress Questionnaire    Feeling of Stress : Not at all  Social Connections: Moderately Integrated (05/14/2022)   Social Connection and Isolation Panel [NHANES]    Frequency of Communication with Friends and Family: More than three times a  week    Frequency of Social Gatherings with Friends and Family: Three times a week    Attends Religious Services: More than 4 times per year    Active Member of Clubs or Organizations: Yes    Attends Banker Meetings: More than 4 times per year    Marital Status: Widowed  Intimate Partner Violence: Not At Risk (05/14/2022)    Humiliation, Afraid, Rape, and Kick questionnaire    Fear of Current or Ex-Partner: No    Emotionally Abused: No    Physically Abused: No    Sexually Abused: No     Review of Systems     Objective:   Physical Exam Vitals reviewed.  Constitutional:      Appearance: Normal appearance.  Cardiovascular:     Rate and Rhythm: Normal rate and regular rhythm.     Heart sounds: Normal heart sounds. No murmur heard.    No friction rub. No gallop.  Pulmonary:     Effort: Pulmonary effort is normal.     Breath sounds: Normal breath sounds.  Musculoskeletal:     Right lower leg: No edema.     Left lower leg: No edema.  Neurological:     Mental Status: She is alert.           Assessment & Plan:  Essential hypertension, benign Recommended taking 1 clonidine at night and discontinuing the clonidine in the morning.  Monitor blood pressures.  Goal blood pressure is less than 140/90

## 2022-10-07 DIAGNOSIS — H40153 Residual stage of open-angle glaucoma, bilateral: Secondary | ICD-10-CM | POA: Diagnosis not present

## 2022-10-13 ENCOUNTER — Ambulatory Visit (INDEPENDENT_AMBULATORY_CARE_PROVIDER_SITE_OTHER): Payer: PPO | Admitting: Family Medicine

## 2022-10-13 VITALS — BP 120/66 | HR 69 | Temp 97.8°F | Ht 63.0 in | Wt 160.6 lb

## 2022-10-13 DIAGNOSIS — A09 Infectious gastroenteritis and colitis, unspecified: Secondary | ICD-10-CM | POA: Diagnosis not present

## 2022-10-13 MED ORDER — AMOXICILLIN-POT CLAVULANATE 875-125 MG PO TABS
1.0000 | ORAL_TABLET | Freq: Two times a day (BID) | ORAL | 0 refills | Status: AC
Start: 2022-10-13 — End: ?

## 2022-10-13 NOTE — Progress Notes (Signed)
Subjective:    Patient ID: Debra [name], female    DOB: 1934-05-04, 88 y.o.   MRN: [id number]  Patient went to the hospital in May and at that time was diagnosed with diverticulitis as well as C. difficile colitis.  She was treated with Augmentin as well as vancomycin and her symptoms improved.  Starting Saturday she developed intense lower abdominal pain and cramps, frequent diarrhea.  She denies any fever or chills.  She denies any nausea or vomiting but she does have left lower quadrant abdominal pain.  She continues to have diarrhea.  She denies any hematochezia or melena or hematemesis.  Past Medical History:  Diagnosis Date   Arthritis    Phreesia 07/02/2020   Brain mass    monitor by Dr Dutch Quint - right posterior fossa meningioma - thinks it's benign per patient   Cancer (HCC)    hand, face - spots removed   Constipation    Degeneration of lumbar or lumbosacral intervertebral disc    arthritis in back, hips and hands - otc med prn   Diarrhea    Diverticulosis of colon with hemorrhage    Dysrhythmia    Hx irregular heart beat - 15 yrs ago   Female stress incontinence    Glaucoma    Phreesia 07/02/2020   Headache    otc med prn   Hypertension    Lumbago    Missed abortion    x 2 - no surgery required   Paroxysmal supraventricular tachycardia    Hx   PONV (postoperative nausea and vomiting)    Pure hypercholesterolemia    SVD (spontaneous vaginal delivery)    x 4   Unspecified glaucoma(365.9)    bilateral   Past Surgical History:  Procedure Laterality Date   ABDOMINAL HYSTERECTOMY     APPENDECTOMY     COLONOSCOPY     EYE SURGERY     bilateral cataract eye surgery   EYE SURGERY     laser to relieve eye pressure - bilateral   LAPAROTOMY N/A 02/06/2014   Procedure: EXPLORATORY LAPAROTOMY;  Surgeon: Willodean Rosenthal, MD;  Location: [facility];  Service: Gynecology;  Laterality: N/A;   MULTIPLE TOOTH EXTRACTIONS     upper   right hand surgery     ganglion  cyst removed   TUBAL LIGATION     Current Outpatient Medications on File Prior to Visit  Medication Sig Dispense Refill   acetaminophen (TYLENOL) 325 MG tablet Take 650 mg by mouth every 6 (six) hours as needed.     amLODipine (NORVASC) 10 MG tablet TAKE ONE TABLET BY MOUTH ONCE DAILY (PLEASE KEEP ANNUAL VISIT FOR FUTURE REFILLS) 60 tablet 0   atenolol (TENORMIN) 50 MG tablet TAKE ONE TABLET BY MOUTH ONCE A DAY (PLEASE KEEP ANNUAL VISIT FOR FUTURE REFILLS) 90 tablet 3   brimonidine (ALPHAGAN) 0.2 % ophthalmic solution Place 1 drop into both eyes 2 (two) times daily.     cholecalciferol (VITAMIN D3) 25 MCG (1000 UNIT) tablet Take 1,000 Units by mouth daily.     cloNIDine (CATAPRES) 0.1 MG tablet TAKE 1 TABLET BY MOUTH IN THE MORNING AND 2 TABLETS BY MOUTH AT BEDTIME (Patient taking differently: Take 0.1 mg by mouth 2 (two) times daily. 1/2 tablet in AM and 1/2 at bedtime.) 270 tablet 3   latanoprost (XALATAN) 0.005 % ophthalmic solution Place 1 drop into both eyes at bedtime.     lisinopril (ZESTRIL) 40 MG tablet TAKE 1 TABLET BY MOUTH DAILY 90  tablet 3   pravastatin (PRAVACHOL) 80 MG tablet TAKE ONE TABLET BY MOUTH ONCE A DAY (PLEASE KEEP ANNUAL VISIT FOR FUTURE REFILLS) 90 tablet 0   timolol (TIMOPTIC) 0.5 % ophthalmic solution Place 1 drop into both eyes 2 (two) times daily.     traMADol (ULTRAM) 50 MG tablet Take 50 mg by mouth every 6 (six) hours as needed.     No current facility-administered medications on file prior to visit.   Allergies  Allergen Reactions   Codeine Phosphate Nausea And Vomiting    REACTION: unspecified   Lipitor [Atorvastatin]    Pneumococcal Vaccines Hives   Sulfa Antibiotics     unknown   Latex Hives, Itching and Rash   Social History   Socioeconomic History   Marital status: Widowed    Spouse name: Not on file   Number of children: 4   Years of education: Not on file   Highest education level: Not on file  Occupational History   Occupation: raised  tobacco    Employer: Retired  Tobacco Use   Smoking status: Never   Smokeless tobacco: Never  Vaping Use   Vaping status: Never Used  Substance and Sexual Activity   Alcohol use: No   Drug use: No   Sexual activity: Not Currently    Birth control/protection: None, Post-menopausal, Surgical  Other Topics Concern   Not on file  Social History Narrative    Has living will.  Daughter is HCPOA, Lenice Llamas.Full code.   3 sons and 1 daughter.   8 grandchildren   5 great grandchildren   Social Determinants of Health   Financial Resource Strain: Low Risk  (05/14/2022)   Overall Financial Resource Strain (CARDIA)    Difficulty of Paying Living Expenses: Not hard at all  Food Insecurity: No Food Insecurity (05/14/2022)   Hunger Vital Sign    Worried About Running Out of Food in the Last Year: Never true    Ran Out of Food in the Last Year: Never true  Transportation Needs: No Transportation Needs (05/14/2022)   PRAPARE - Administrator, Civil Service (Medical): No    Lack of Transportation (Non-Medical): No  Physical Activity: Insufficiently Active (05/14/2022)   Exercise Vital Sign    Days of Exercise per Week: 3 days    Minutes of Exercise per Session: 30 min  Stress: No Stress Concern Present (05/14/2022)   [employer] of Occupational Health - Occupational Stress Questionnaire    Feeling of Stress : Not at all  Social Connections: Moderately Integrated (05/14/2022)   Social Connection and Isolation Panel [NHANES]    Frequency of Communication with Friends and Family: More than three times a week    Frequency of Social Gatherings with Friends and Family: Three times a week    Attends Religious Services: More than 4 times per year    Active Member of Clubs or Organizations: Yes    Attends Banker Meetings: More than 4 times per year    Marital Status: Widowed  Intimate Partner Violence: Not At Risk (05/14/2022)   Humiliation, Afraid, Rape, and Kick  questionnaire    Fear of Current or Ex-Partner: No    Emotionally Abused: No    Physically Abused: No    Sexually Abused: No     Review of Systems     Objective:   Physical Exam Vitals reviewed.  Constitutional:      Appearance: Normal appearance.  Cardiovascular:     Rate and  Rhythm: Normal rate and regular rhythm.     Heart sounds: Normal heart sounds. No murmur heard.    No friction rub. No gallop.  Pulmonary:     Effort: Pulmonary effort is normal.     Breath sounds: Normal breath sounds.  Abdominal:     General: Bowel sounds are increased.     Tenderness: There is abdominal tenderness in the suprapubic area and left lower quadrant.  Musculoskeletal:     Right lower leg: No edema.     Left lower leg: No edema.  Neurological:     Mental Status: She is alert.           Assessment & Plan:  Diarrhea of infectious origin - Plan: C. difficile GDH and Toxin A/B Based on her exam, I am suspicious that she has diverticulitis.  Begin Augmentin 875 mg twice daily for 10 days.  However given her past medical history also will check her stool for C. difficile.  Will certainly treat if stool is positive for C. difficile.  Reassess in 48 hours.  Seek medical attention immediately if worsening

## 2022-11-17 ENCOUNTER — Ambulatory Visit
Admission: RE | Admit: 2022-11-17 | Discharge: 2022-11-17 | Disposition: A | Discharge status: Home / Self Care | Payer: PPO | Source: Ambulatory Visit | Attending: Obstetrics and Gynecology | Admitting: Obstetrics and Gynecology

## 2022-11-17 DIAGNOSIS — N858 Other specified noninflammatory disorders of uterus: Secondary | ICD-10-CM | POA: Diagnosis not present

## 2022-11-17 DIAGNOSIS — N9489 Other specified conditions associated with female genital organs and menstrual cycle: Secondary | ICD-10-CM

## 2022-11-17 DIAGNOSIS — Z780 Asymptomatic menopausal state: Secondary | ICD-10-CM | POA: Diagnosis not present

## 2022-11-17 DIAGNOSIS — K5730 Diverticulosis of large intestine without perforation or abscess without bleeding: Secondary | ICD-10-CM | POA: Diagnosis not present

## 2022-11-17 MED ORDER — GADOPICLENOL 0.5 MMOL/ML IV SOLN
7.5000 mL | Freq: Once | INTRAVENOUS | Status: AC | PRN
  Administered 2022-11-17: 7.5 mL via INTRAVENOUS

## 2022-11-24 ENCOUNTER — Other Ambulatory Visit: Payer: Self-pay | Admitting: Family Medicine

## 2022-11-25 NOTE — Telephone Encounter (Signed)
Requested Prescriptions  Pending Prescriptions Disp Refills   amLODipine (NORVASC) 10 MG tablet [Pharmacy Med Name: AMLODIPINE BESYLATE 10MG  TABLET] 90 tablet 0    Sig: TAKE ONE TABLET BY MOUTH ONCE DAILY (PLEASE KEEP ANNUAL VISIT FOR FUTURE REFILLS)     Cardiovascular: Calcium Channel Blockers 2 Failed - 11/24/2022 12:30 PM      Failed - Valid encounter within last 6 months    Recent Outpatient Visits           1 year ago Diverticulitis   [facility] Donita Brooks, MD   2 years ago Essential hypertension, benign   [facility] [place], Priscille Heidelberg, MD   2 years ago Routine general medical examination at a health care facility   [facility] [place], Velna Hatchet, MD   3 years ago Essential hypertension, benign   [facility] [place], Velna Hatchet, MD   3 years ago Routine general medical examination at a health care facility   [facility] [place], Velna Hatchet, MD              Passed - Last BP in normal range    BP Readings from Last 1 Encounters:  10/13/22 120/66         Passed - Last Heart Rate in normal range    Pulse Readings from Last 1 Encounters:  10/13/22 69

## 2022-12-02 ENCOUNTER — Telehealth: Payer: Self-pay | Admitting: *Deleted

## 2022-12-02 ENCOUNTER — Encounter: Payer: Self-pay | Admitting: Psychiatry

## 2022-12-02 ENCOUNTER — Telehealth: Payer: Self-pay | Admitting: Obstetrics and Gynecology

## 2022-12-02 ENCOUNTER — Other Ambulatory Visit: Payer: Self-pay | Admitting: Family Medicine

## 2022-12-02 DIAGNOSIS — N9489 Other specified conditions associated with female genital organs and menstrual cycle: Secondary | ICD-10-CM

## 2022-12-02 NOTE — Telephone Encounter (Signed)
Called patient's daughter Alcide Evener to review MRI result - pt does not use MyChart and has a difficult time talking over the phone due to hearing loss.   Discussed that our suspicion for malignancy is relatively low, but I recommend patient sees gynecologic oncology given potential complexity of surgical evaluation/management. Recommend getting [name] test prior to gyn onc appointment.   Ms. Cleaster Corin voiced her understanding and will relay the message to her mother.   Onc referral placed, messaged routed to [facility] admin pool to coordinate lab visit for [name].   Harvie Bridge, MD Obstetrician & Gynecologist, [facility] for [employer], [facility]

## 2022-12-02 NOTE — Telephone Encounter (Signed)
Spoke with Debra [name] pt's daughter regarding her referral to GYN oncology. She has an appointment scheduled with Dr. Alvester Morin on October 14 th at 1030. Patient's daughter agrees to date and time. She has been provided with office address and location. She is also aware of our mask and visitor policy. Patient's daughter verbalized understanding and will call with any questions.

## 2022-12-09 ENCOUNTER — Other Ambulatory Visit: Payer: PPO

## 2022-12-09 ENCOUNTER — Other Ambulatory Visit: Payer: Self-pay

## 2022-12-09 DIAGNOSIS — N9489 Other specified conditions associated with female genital organs and menstrual cycle: Secondary | ICD-10-CM

## 2022-12-10 LAB — POSTMENOPAUSAL INTERP: LOW

## 2022-12-10 LAB — OVARIAN MALIGNANCY RISK-ROMA
Cancer Antigen (CA) 125: 11.6 U/mL (ref 0.0–38.1)
HE4: 114 pmol/L — ABNORMAL HIGH (ref 0.0–96.9)
Postmenopausal ROMA: 2.03
Premenopausal ROMA: 3.6 — ABNORMAL HIGH

## 2022-12-10 LAB — PREMENOPAUSAL INTERP: HIGH

## 2022-12-14 ENCOUNTER — Encounter: Payer: Self-pay | Admitting: Psychiatry

## 2022-12-14 ENCOUNTER — Inpatient Hospital Stay: Payer: PPO

## 2022-12-14 ENCOUNTER — Inpatient Hospital Stay: Payer: PPO | Attending: Psychiatry | Admitting: Psychiatry

## 2022-12-14 VITALS — BP 142/68 | HR 69 | Temp 98.5°F | Resp 19 | Wt 159.6 lb

## 2022-12-14 DIAGNOSIS — Z90710 Acquired absence of both cervix and uterus: Secondary | ICD-10-CM | POA: Insufficient documentation

## 2022-12-14 DIAGNOSIS — Z90721 Acquired absence of ovaries, unilateral: Secondary | ICD-10-CM | POA: Diagnosis not present

## 2022-12-14 DIAGNOSIS — R978 Other abnormal tumor markers: Secondary | ICD-10-CM | POA: Insufficient documentation

## 2022-12-14 DIAGNOSIS — E7800 Pure hypercholesterolemia, unspecified: Secondary | ICD-10-CM | POA: Insufficient documentation

## 2022-12-14 DIAGNOSIS — I10 Essential (primary) hypertension: Secondary | ICD-10-CM | POA: Insufficient documentation

## 2022-12-14 DIAGNOSIS — R1900 Intra-abdominal and pelvic swelling, mass and lump, unspecified site: Secondary | ICD-10-CM

## 2022-12-14 DIAGNOSIS — Z79899 Other long term (current) drug therapy: Secondary | ICD-10-CM | POA: Insufficient documentation

## 2022-12-14 DIAGNOSIS — Z8042 Family history of malignant neoplasm of prostate: Secondary | ICD-10-CM | POA: Diagnosis not present

## 2022-12-14 DIAGNOSIS — Z85828 Personal history of other malignant neoplasm of skin: Secondary | ICD-10-CM | POA: Diagnosis not present

## 2022-12-14 DIAGNOSIS — Z803 Family history of malignant neoplasm of breast: Secondary | ICD-10-CM | POA: Insufficient documentation

## 2022-12-14 DIAGNOSIS — I471 Supraventricular tachycardia, unspecified: Secondary | ICD-10-CM | POA: Diagnosis not present

## 2022-12-14 DIAGNOSIS — M1990 Unspecified osteoarthritis, unspecified site: Secondary | ICD-10-CM | POA: Insufficient documentation

## 2022-12-14 DIAGNOSIS — D3912 Neoplasm of uncertain behavior of left ovary: Secondary | ICD-10-CM | POA: Insufficient documentation

## 2022-12-14 NOTE — Progress Notes (Signed)
GYNECOLOGIC ONCOLOGY NEW PATIENT CONSULTATION  Date of Service: 12/14/2022 Referring Provider: Harvie Bridge, MD   ASSESSMENT AND PLAN: Debra [name] is a 88 y.o. woman with a 14 cm cystic pelvic collection, O RADS 4, with normal tumor markers.  We reviewed that the exact etiology of the pelvic mass is unclear, but could include a benign, borderline, or malignant process.   Things that are reassuring include the predominantly cystic nature of the mass, no evidence of peritoneal disease, no lymphadenopathy.  Additionally her CA125 and postmenopausal [name] are normal which are reassuring.  Also, this mass was first noted in at least 2008.  Although his grown in size over time, over that long duration of time, with no evidence of spread of disease, this is reassuring for either a benign process or very slow moving borderline or malignant process.  Given the above, in addition to the patient's age and medical comorbidities, I recommended that we not intervene with surgery.  Instead, I recommend close observation.  I did review that this means I cannot definitively rule out malignant process.  However, if this were to represent a malignant process, it is likely slow-moving and may not be what limits her life.  Also, at this time I have some suspicions that this may represent some sort of inclusion cyst given that no mass was noted at surgery in 2015 (operative report reviewed) and with some of the cystic collection looking like it accommodates this to surrounding structures on imaging.  I will have her imaging sent to [facility] for review with this question in mind (is there any ovarian tissue noted, could this represent an inclusion cyst?).  Otherwise, recommend repeat CT scan and tumor markers with follow-up in 3 months.  If there were to be evidence in the future of metastatic disease, could consider biopsy of that tissue for definitive diagnosis.  Additionally, if patient were to become symptomatic,  we could consider a palliative drainage for symptom control.  RTC 75mo after 75mo fu CT scan   A copy of this note was sent to the patient's referring provider.  Clide Cliff, MD Gynecologic Oncology   Medical Decision Making I personally spent  TOTAL 60 minutes face-to-face and non-face-to-face in the care of this patient, which includes all pre, intra, and post visit time on the date of service.   ------------  CC: Pelvic mass  HISTORY OF PRESENT ILLNESS:  Debra [name] is a 88 y.o. woman who is seen in consultation at the request of Harvie Bridge, MD for evaluation of adnexal mass.  Patient was seen as a new GYN visit on 09/22/2022.  She is being evaluated for follow-up from an ED visit on 07/29/2022 for left lower quadrant pain with also diagnosis of diverticulitis.  During her workup, she was noted to have on CT abdomen/pelvis a left adnexal mass measuring 9.9 x 9.5 x 12.9 cm with stable calcifications and no solid components.  On review, this mass was noted to be present since at least 2008 but has increased in size from 3.9 cm in 2008 to 6.7 cm in 2015 2, 0.9 cm currently.  In 2015 she underwent a laparotomy with Dr. Erin Fulling and was noted to have pelvic adhesions with fluid between the adhesions on the left side but no masses.  The left ovary was not visualized or palpable.  Patient subsequently underwent a MRI pelvis on 11/17/2022 which showed a 14.5 cm complex cystic mass centered in the left adnexa, O RADS 4 with  additional signs of severe sigmoid diverticulosis.  A CA125 was collected on 12/09/2022 and returned as normal at 11.6.  HE4 was elevated at 114, but postmenopausal [name] was low risk.  Patient presents today with her daughter.  She reports that she is not currently having pain.  She does feel uncomfortable occasionally.  She reports an episode about 2 months ago with nausea, vomiting, and feeling like "something ruptured" followed by rectal bleeding.  This  occurred prior to her MRI.  This is now better.  She denies otherwise weight loss.  Reports low appetite.  Occasionally has some bloating.  Feels that she has had a diverticulitis flare several times since May.  She previously underwent an abdominal hysterectomy for heavy bleeding.  She believes at that time she had 1 ovary removed and the other ovary partially removed.  She believes her right ovary was removed and that she has part of her left ovary remaining.  This occurred in her mid 30s.  Patient [place] with [place]. Yvette Rack [place] for her glaucoma.    PAST MEDICAL HISTORY: Past Medical History:  Diagnosis Date   Arthritis    Phreesia 07/02/2020   Brain mass    monitor by Dr Dutch Quint - right posterior fossa meningioma - thinks it's benign per patient   Cancer (HCC)    hand, face - spots removed   Constipation    Degeneration of lumbar or lumbosacral intervertebral disc    arthritis in back, hips and hands - otc med prn   Diarrhea    Diverticulosis of colon with hemorrhage    Dysrhythmia    Hx irregular heart beat - 15 yrs ago   Female stress incontinence    Glaucoma    Phreesia 07/02/2020   Headache    otc med prn   Hypertension    Lumbago    Missed abortion    x 2 - no surgery required   Paroxysmal supraventricular tachycardia (HCC)    Hx   PONV (postoperative nausea and vomiting)    Pure hypercholesterolemia    SVD (spontaneous vaginal delivery)    x 4   Unspecified glaucoma(365.9)    bilateral    PAST SURGICAL HISTORY: Past Surgical History:  Procedure Laterality Date   ABDOMINAL HYSTERECTOMY     APPENDECTOMY     COLONOSCOPY     EYE SURGERY     bilateral cataract eye surgery   EYE SURGERY     laser to relieve eye pressure - bilateral   LAPAROTOMY N/A 02/06/2014   Procedure: EXPLORATORY LAPAROTOMY;  Surgeon: Willodean Rosenthal, MD;  Location: [facility];  Service: Gynecology;  Laterality: N/A;   MULTIPLE TOOTH EXTRACTIONS     upper   right hand  surgery     ganglion cyst removed   TUBAL LIGATION      OB/GYN HISTORY: OB History  Gravida Para Term Preterm AB Living  6 4 4  0 2 4  SAB IAB Ectopic Multiple Live Births  2 0 0 0 4    # Outcome Date GA Lbr Len/2nd Weight Sex Type Anes PTL Lv  6 SAB           5 SAB           4 Term      Vag-Spont   LIV  3 Term      Vag-Spont   LIV  2 Term      Vag-Spont   LIV  1 Term      Vag-Spont  LIV      Age at menarche: 54 Age at menopause: Unknown, hysterectomy at age 67 Hx of HRT: No Hx of STI: No Last pap: Unknown History of abnormal pap smears: unsure, she thinks that she may have had abnormal prior to her hysterectomy, normal after her hysterectomy  SCREENING STUDIES:  Last mammogram: 2018 Last colonoscopy: 2014  MEDICATIONS:  Current Outpatient Medications:    acetaminophen (TYLENOL) 325 MG tablet, Take 650 mg by mouth every 6 (six) hours as needed., Disp: , Rfl:    amLODipine (NORVASC) 10 MG tablet, TAKE ONE TABLET BY MOUTH ONCE DAILY (PLEASE KEEP ANNUAL VISIT FOR FUTURE REFILLS), Disp: 90 tablet, Rfl: 0   atenolol (TENORMIN) 50 MG tablet, TAKE ONE TABLET BY MOUTH ONCE A DAY (PLEASE KEEP ANNUAL VISIT FOR FUTURE REFILLS), Disp: 90 tablet, Rfl: 3   brimonidine (ALPHAGAN) 0.2 % ophthalmic solution, Place 1 drop into both eyes 2 (two) times daily., Disp: , Rfl:    cholecalciferol (VITAMIN D3) 25 MCG (1000 UNIT) tablet, Take 1,000 Units by mouth daily., Disp: , Rfl:    cloNIDine (CATAPRES) 0.1 MG tablet, TAKE 1 TABLET BY MOUTH IN THE MORNING AND 2 TABLETS BY MOUTH AT BEDTIME (Patient taking differently: Take 0.1 mg by mouth 2 (two) times daily. 1/2 tablet in AM and 1/2 at bedtime.), Disp: 270 tablet, Rfl: 3   latanoprost (XALATAN) 0.005 % ophthalmic solution, Place 1 drop into both eyes at bedtime., Disp: , Rfl:    lisinopril (ZESTRIL) 40 MG tablet, TAKE 1 TABLET BY MOUTH DAILY, Disp: 90 tablet, Rfl: 3   pravastatin (PRAVACHOL) 80 MG tablet, TAKE ONE TABLET BY MOUTH ONCE A DAY  (PLEASE KEEP ANNUAL VISIT FOR FUTURE REFILLS), Disp: 90 tablet, Rfl: 0   timolol (TIMOPTIC) 0.5 % ophthalmic solution, Place 1 drop into both eyes 2 (two) times daily., Disp: , Rfl:    traMADol (ULTRAM) 50 MG tablet, Take 50 mg by mouth every 6 (six) hours as needed., Disp: , Rfl:   ALLERGIES: Allergies  Allergen Reactions   Codeine Phosphate Nausea And Vomiting    REACTION: unspecified   Lipitor [Atorvastatin]    Pneumococcal Vaccines Hives   Sulfa Antibiotics     unknown   Latex Hives, Itching and Rash    FAMILY HISTORY: Family History  Problem Relation Age of Onset   Aneurysm Mother    Alzheimer's disease Mother    Prostate cancer Father    Cancer Sister        unknown kind, metastatic   Arthritis Sister    Prostate cancer Brother    Heart disease Brother    Hypertension Brother    Glaucoma Brother    Prostate cancer Brother    Diabetes Brother    Heart disease Brother    Glaucoma Brother    Breast cancer Paternal Aunt    Colon cancer Neg Hx    Ovarian cancer Neg Hx    Endometrial cancer Neg Hx    Pancreatic cancer Neg Hx     SOCIAL HISTORY: Social History   Socioeconomic History   Marital status: Widowed    Spouse name: Not on file   Number of children: 4   Years of education: Not on file   Highest education level: Not on file  Occupational History   Occupation: raised tobacco    Employer: Retired  Tobacco Use   Smoking status: Never   Smokeless tobacco: Never  Vaping Use   Vaping status: Never Used  Substance and Sexual Activity  Alcohol use: No   Drug use: No   Sexual activity: Not Currently    Birth control/protection: None, Post-menopausal, Surgical  Other Topics Concern   Not on file  Social History Narrative    Has living will.  Daughter is HCPOA, Lenice Llamas.Full code.   3 sons and 1 daughter.   8 grandchildren   5 great grandchildren   Social Determinants of Health   Financial Resource Strain: Low Risk  (05/14/2022)   Overall  Financial Resource Strain (CARDIA)    Difficulty of Paying Living Expenses: Not hard at all  Food Insecurity: No Food Insecurity (05/14/2022)   Hunger Vital Sign    Worried About Running Out of Food in the Last Year: Never true    Ran Out of Food in the Last Year: Never true  Transportation Needs: No Transportation Needs (05/14/2022)   PRAPARE - Administrator, Civil Service (Medical): No    Lack of Transportation (Non-Medical): No  Physical Activity: Insufficiently Active (05/14/2022)   Exercise Vital Sign    Days of Exercise per Week: 3 days    Minutes of Exercise per Session: 30 min  Stress: No Stress Concern Present (05/14/2022)   [employer] of Occupational Health - Occupational Stress Questionnaire    Feeling of Stress : Not at all  Social Connections: Moderately Integrated (05/14/2022)   Social Connection and Isolation Panel [NHANES]    Frequency of Communication with Friends and Family: More than three times a week    Frequency of Social Gatherings with Friends and Family: Three times a week    Attends Religious Services: More than 4 times per year    Active Member of Clubs or Organizations: Yes    Attends Banker Meetings: More than 4 times per year    Marital Status: Widowed  Intimate Partner Violence: Not At Risk (05/14/2022)   Humiliation, Afraid, Rape, and Kick questionnaire    Fear of Current or Ex-Partner: No    Emotionally Abused: No    Physically Abused: No    Sexually Abused: No    REVIEW OF SYSTEMS: New patient intake form was reviewed.  Complete 10-system review is negative except for the following: Pelvic pain  PHYSICAL EXAM: BP (!) 155/69 (BP Location: Left Arm)   Pulse 69   Temp 98.5 F (36.9 C) (Oral)   Resp 19   Wt 159 lb 9.6 oz (72.4 kg)   SpO2 98%   BMI 28.27 kg/m  Constitutional: No acute distress. Neuro/Psych: Alert, oriented.  Head and Neck: Normocephalic, atraumatic. Neck symmetric without masses. Sclera anicteric.   Respiratory: Normal work of breathing. Clear to auscultation bilaterally. Cardiovascular: Regular rate and rhythm, no murmurs, rubs, or gallops. Abdomen: Normoactive bowel sounds. Soft, non-distended, non-tender to palpation. No masses appreciated. Well healed infraumbilical midline and pfannenstiel incisions. Extremities: Grossly normal range of motion. Warm, well perfused. No edema bilaterally. Skin: No rashes or lesions. Lymphatic: No cervical, supraclavicular, or inguinal adenopathy. Genitourinary: External genitalia without lesions. Urethral meatus without lesions or prolapse. On speculum exam, vagina without lesions. Bimanual exam reveals normal cuff. No discrete mass palpated. Mild TTP in pelvis. Exam chaperoned by Warner Mccreedy, NP   LABORATORY AND RADIOLOGIC DATA: Outside medical records were reviewed to synthesize the above history, along with the history and physical obtained during the visit.  Outside laboratory, and imaging reports were reviewed, with pertinent results below.  I personally reviewed the outside images.  WBC  Date Value Ref Range Status  07/29/2022 8.7  4.0 - 10.5 K/uL Final   Hemoglobin  Date Value Ref Range Status  07/29/2022 16.4 (H) 12.0 - 15.0 g/dL Final   HGB  Date Value Ref Range Status  03/10/2016 13.4 11.6 - 15.9 g/dL Final   HCT  Date Value Ref Range Status  07/29/2022 48.7 (H) 36.0 - 46.0 % Final  03/10/2016 39.6 34.8 - 46.6 % Final   Platelets  Date Value Ref Range Status  07/29/2022 231 150 - 400 K/uL Final  03/10/2016 161 145 - 400 10e3/uL Final   Magnesium  Date Value Ref Range Status  07/29/2022 2.1 1.7 - 2.4 mg/dL Final    Comment:    Performed at [facility], [address], [place], [place] [address]   Creatinine  Date Value Ref Range Status  03/10/2016 0.8 0.6 - 1.1 mg/dL Final   Creat  Date Value Ref Range Status  07/16/2022 0.81 0.60 - 0.95 mg/dL Final   Creatinine, Ser  Date Value Ref Range Status  07/29/2022  0.94 0.44 - 1.00 mg/dL Final   AST  Date Value Ref Range Status  07/29/2022 18 15 - 41 U/L Final  03/10/2016 13 5 - 34 U/L Final   ALT  Date Value Ref Range Status  07/29/2022 17 0 - 44 U/L Final  03/10/2016 9 0 - 55 U/L Final   Cancer Antigen (CA) 125  Date Value Ref Range Status  12/09/2022 11.6 0.0 - 38.1 U/mL Final    Comment:    Roche [employer]ostics Electrochemiluminescence Immunoassay (ECLIA) Values obtained with different assay methods or kits cannot be used interchangeably.  Results cannot be interpreted as absolute evidence of the presence or absence of malignant disease.   09/22/2022 11.3 0.0 - 38.1 U/mL Final    Comment:    Roche [employer]ostics Electrochemiluminescence Immunoassay (ECLIA) Values obtained with different assay methods or kits cannot be used interchangeably.  Results cannot be interpreted as absolute evidence of the presence or absence of malignant disease.    CA 125  Date Value Ref Range Status  08/24/2013 7.3 0.0 - 30.2 U/mL Final    MR PELVIS W WO CONTRAST 11/17/2022  Narrative CLINICAL DATA:  Cystic pelvic mass.  Postmenopausal female.  EXAM: MRI PELVIS WITHOUT AND WITH CONTRAST  TECHNIQUE: Multiplanar multisequence MR imaging of the pelvis was performed both before and after administration of intravenous contrast.  CONTRAST:  7.5 mL Vueway  COMPARISON:  CT on 07/29/2022 and 03/01/2014  FINDINGS: Lower Urinary Tract: No urinary bladder or urethral abnormality identified.  Bowel: Severe sigmoid diverticulosis, without signs of diverticulitis.  Vascular/Lymphatic: Unremarkable. No pathologically enlarged pelvic lymph nodes identified.  Reproductive:  -- Uterus: Prior hysterectomy. Vaginal cuff is normal in appearance.  -- Right ovary: Appears normal. No ovarian or adnexal masses identified.  -- Left ovary: A complex cystic mass is seen which is centered in the left adnexa. This shows lobulated contour and a few thin internal  septations, some of which are incomplete. No thickened or irregular septations or solid mural nodules identified. This measures 14.5 x 8.0 by 11.8 cm, increased in size from 6.3 x 5.1 by 3.9 cm on 2015 CT.  Other: No peritoneal thickening or abnormal free fluid.  Musculoskeletal:  Unremarkable.  IMPRESSION: 14.5 cm complex cystic mass centered in the left adnexa, increased in size from 6.3 cm on 2015 CT. 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 Recommend GYN oncology consultation. O-RADS 4: Intermediate Risk 10% - < 50% ROM  No evidence  of pelvic metastatic disease.  Severe sigmoid diverticulosis, without signs of diverticulitis.   Electronically Signed By: Danae Orleans M.D. On: 11/21/2022 10:42   CT ABDOMEN PELVIS W CONTRAST 07/29/2022  Narrative CLINICAL DATA:  Left lower quadrant abdominal pain. History of left adnexal mass and exploratory laparotomy 02/06/2014.  EXAM: CT ABDOMEN AND PELVIS WITH CONTRAST  TECHNIQUE: Multidetector CT imaging of the abdomen and pelvis was performed using the standard protocol following bolus administration of intravenous contrast.  RADIATION DOSE REDUCTION: This exam was performed according to the departmental dose-optimization program which includes automated exposure control, adjustment of the mA and/or kV according to patient size and/or use of iterative reconstruction technique.  CONTRAST:  75mL OMNIPAQUE IOHEXOL 350 MG/ML SOLN  COMPARISON:  Abdominopelvic CT 03/01/2014.  Lumbar MRI 11/02/2020.  FINDINGS: Lower chest: Mild scarring at both lung bases, similar to previous study. No confluent airspace disease or significant pleural effusion. Aortic atherosclerosis and mild cardiomegaly noted.  Hepatobiliary: The liver is normal in density without suspicious focal abnormality. There are scattered low-density lesions in the left lobe which are unchanged and consistent with cysts based  on stability. A large calcified gallstone appears unchanged. No evidence of gallbladder wall thickening, surrounding inflammation or biliary ductal dilatation.  Pancreas: Unremarkable. No pancreatic ductal dilatation or surrounding inflammatory changes.  Spleen: Normal in size without focal abnormality.  Adrenals/Urinary Tract: Both adrenal glands appear normal. No evidence of urinary tract calculus, suspicious renal lesion or hydronephrosis. The bladder appears unremarkable for its degree of distention.  Stomach/Bowel: No enteric contrast administered. Stable small hiatal hernia. The stomach otherwise appears unremarkable for its degree of distention. The small bowel and proximal colon appear unremarkable. The appendix is not visualized and may be surgically absent. There are diverticular changes within the descending and sigmoid colon. New wall thickening within the mid to distal sigmoid colon with surrounding inflammation, suspicious for diverticulitis. No evidence of bowel obstruction or perforation.  Vascular/Lymphatic: There are no enlarged abdominal or pelvic lymph nodes. Aortic and branch vessel atherosclerosis without evidence of aneurysm or large vessel occlusion.  Reproductive: Status post hysterectomy. A previously demonstrated left adnexal mass has enlarged significantly since the prior CT of 2015. This measures approximately 9.9 x 9.5 x 12.9 cm (previously 6.8 x 4.6 x 5.0 cm, remeasured). Small calcifications along the lateral aspect of this lesion are unchanged. No solid components are identified. Although near the suspected acute sigmoid diverticulitis, this appears unrelated.  Other: As above, mild pelvic inflammatory changes with a small amount of free pelvic fluid. No other focal fluid collections or pneumoperitoneum. Postsurgical changes in the anterior abdominal wall.  Musculoskeletal: No acute or significant osseous findings. Multilevel spondylosis with  chronic degenerative anterolisthesis and mild foraminal narrowing bilaterally at L4-5.  IMPRESSION: 1. New wall thickening within the mid to distal sigmoid colon with surrounding inflammation, suspicious for acute diverticulitis. No evidence of bowel obstruction, perforation or abscess. 2. Interval enlargement of previously demonstrated left adnexal mass since 2015, now measuring up to 12.9 cm in diameter. This appears unrelated to the patient's acute symptoms, and could reflect a low-grade ovarian neoplasm or peritoneal inclusion cyst. Recommend gynecologic follow-up. 3. Cholelithiasis without evidence of cholecystitis or biliary ductal dilatation. 4.  Aortic Atherosclerosis (ICD10-I70.0).   Electronically Signed By: Carey Bullocks M.D. On: 07/29/2022 10:27

## 2022-12-14 NOTE — Patient Instructions (Signed)
It was a pleasure to see you in clinic today. - We discussed that I will have your images evaluated at [facility].  Otherwise we talked about close follow-up with repeat exam and imaging in 3 months. - Return visit planned for 3 months  Thank you very much for allowing me to provide care for you today.  I appreciate your confidence in choosing our Gynecologic Oncology team at [facility].  If you have any questions about your visit today please call our office or send [place] a MyChart message and we will get back to you as soon as possible.

## 2022-12-15 ENCOUNTER — Telehealth: Payer: Self-pay | Admitting: Psychiatry

## 2022-12-15 DIAGNOSIS — R1900 Intra-abdominal and pelvic swelling, mass and lump, unspecified site: Secondary | ICD-10-CM | POA: Diagnosis not present

## 2022-12-15 NOTE — Telephone Encounter (Signed)
Called pt's daughter per pt's request. [facility] MRI overread overall reassuring. ORADS3. Continue plan with observation.

## 2023-01-04 ENCOUNTER — Encounter: Payer: Self-pay | Admitting: Family Medicine

## 2023-01-04 ENCOUNTER — Telehealth: Payer: Self-pay

## 2023-01-04 ENCOUNTER — Ambulatory Visit: Payer: PPO | Admitting: Family Medicine

## 2023-01-04 ENCOUNTER — Ambulatory Visit
Admission: RE | Admit: 2023-01-04 | Discharge: 2023-01-04 | Disposition: A | Discharge status: Home / Self Care | Payer: PPO | Source: Ambulatory Visit | Attending: Family Medicine | Admitting: Family Medicine

## 2023-01-04 VITALS — BP 118/70 | HR 67 | Temp 97.9°F | Ht 63.0 in | Wt 159.5 lb

## 2023-01-04 DIAGNOSIS — R159 Full incontinence of feces: Secondary | ICD-10-CM | POA: Diagnosis not present

## 2023-01-04 DIAGNOSIS — R109 Unspecified abdominal pain: Secondary | ICD-10-CM | POA: Diagnosis not present

## 2023-01-04 DIAGNOSIS — Z23 Encounter for immunization: Secondary | ICD-10-CM

## 2023-01-04 DIAGNOSIS — K8020 Calculus of gallbladder without cholecystitis without obstruction: Secondary | ICD-10-CM | POA: Diagnosis not present

## 2023-01-04 DIAGNOSIS — K5900 Constipation, unspecified: Secondary | ICD-10-CM | POA: Diagnosis not present

## 2023-01-04 NOTE — Progress Notes (Signed)
Subjective:    Patient ID: Debra [name], female    DOB: [birth year], 88 y.o.   MRN: [id number]  Patient states that for the last 2 months, she has been having fecal leakage.  She states that she will have 1 bowel movement a week maybe 2 that are normal.  The rest of the time, she will not feel any urge to defecate however she will have fecal leakage and fecal incontinence.  She states she will occasionally have blood when she wipes due to irritation from having to clean herself up 70 times a day.  This apparently occurs 4 times a day.  She denies any abdominal pain.  She denies any fevers or chills.  She denies any nausea or vomiting Past Medical History:  Diagnosis Date   Arthritis    Phreesia 07/02/2020   Brain mass    monitor by Dr Dutch Quint - right posterior fossa meningioma - thinks it's benign per patient   Cancer (HCC)    hand, face - spots removed   Constipation    Degeneration of lumbar or lumbosacral intervertebral disc    arthritis in back, hips and hands - otc med prn   Diarrhea    Diverticulosis of colon with hemorrhage    Dysrhythmia    Hx irregular heart beat - 15 yrs ago   Female stress incontinence    Glaucoma    Phreesia 07/02/2020   Headache    otc med prn   Hypertension    Lumbago    Missed abortion    x 2 - no surgery required   Paroxysmal supraventricular tachycardia (HCC)    Hx   PONV (postoperative nausea and vomiting)    Pure hypercholesterolemia    SVD (spontaneous vaginal delivery)    x 4   Unspecified glaucoma(365.9)    bilateral   Past Surgical History:  Procedure Laterality Date   ABDOMINAL HYSTERECTOMY     APPENDECTOMY     COLONOSCOPY     EYE SURGERY     bilateral cataract eye surgery   EYE SURGERY     laser to relieve eye pressure - bilateral   LAPAROTOMY N/A 02/06/2014   Procedure: EXPLORATORY LAPAROTOMY;  Surgeon: Willodean Rosenthal, MD;  Location: [facility];  Service: Gynecology;  Laterality: N/A;   MULTIPLE TOOTH EXTRACTIONS      upper   right hand surgery     ganglion cyst removed   TUBAL LIGATION     Current Outpatient Medications on File Prior to Visit  Medication Sig Dispense Refill   acetaminophen (TYLENOL) 325 MG tablet Take 650 mg by mouth every 6 (six) hours as needed.     amLODipine (NORVASC) 10 MG tablet TAKE ONE TABLET BY MOUTH ONCE DAILY (PLEASE KEEP ANNUAL VISIT FOR FUTURE REFILLS) 90 tablet 0   atenolol (TENORMIN) 50 MG tablet TAKE ONE TABLET BY MOUTH ONCE A DAY (PLEASE KEEP ANNUAL VISIT FOR FUTURE REFILLS) 90 tablet 3   brimonidine (ALPHAGAN) 0.2 % ophthalmic solution Place 1 drop into both eyes 2 (two) times daily.     cholecalciferol (VITAMIN D3) 25 MCG (1000 UNIT) tablet Take 1,000 Units by mouth daily.     cloNIDine (CATAPRES) 0.1 MG tablet TAKE 1 TABLET BY MOUTH IN THE MORNING AND 2 TABLETS BY MOUTH AT BEDTIME (Patient taking differently: Take 0.1 mg by mouth 2 (two) times daily. 1/2 tablet in AM and 1/2 at bedtime.) 270 tablet 3   latanoprost (XALATAN) 0.005 % ophthalmic solution Place 1 drop into  both eyes at bedtime.     lisinopril (ZESTRIL) 40 MG tablet TAKE 1 TABLET BY MOUTH DAILY 90 tablet 3   polyethylene glycol powder (GLYCOLAX/MIRALAX) 17 GM/SCOOP powder Take 1 Container by mouth as needed for moderate constipation.     pravastatin (PRAVACHOL) 80 MG tablet TAKE ONE TABLET BY MOUTH ONCE A DAY (PLEASE KEEP ANNUAL VISIT FOR FUTURE REFILLS) 90 tablet 0   timolol (TIMOPTIC) 0.5 % ophthalmic solution Place 1 drop into both eyes 2 (two) times daily.     traMADol (ULTRAM) 50 MG tablet Take 50 mg by mouth every 6 (six) hours as needed.     No current facility-administered medications on file prior to visit.   Allergies  Allergen Reactions   Codeine Phosphate Nausea And Vomiting    REACTION: unspecified   Lipitor [Atorvastatin]    Pneumococcal Vaccines Hives   Sulfa Antibiotics     unknown   Latex Hives, Itching and Rash   Social History   Socioeconomic History   Marital status: Widowed     Spouse name: Not on file   Number of children: 4   Years of education: Not on file   Highest education level: Not on file  Occupational History   Occupation: raised tobacco    Employer: Retired  Tobacco Use   Smoking status: Never   Smokeless tobacco: Never  Vaping Use   Vaping status: Never Used  Substance and Sexual Activity   Alcohol use: No   Drug use: No   Sexual activity: Not Currently    Birth control/protection: None, Post-menopausal, Surgical  Other Topics Concern   Not on file  Social History Narrative    Has living will.  Daughter is HCPOA, Lenice Llamas.Full code.   3 sons and 1 daughter.   8 grandchildren   5 great grandchildren   Social Determinants of Health   Financial Resource Strain: Low Risk  (05/14/2022)   Overall Financial Resource Strain (CARDIA)    Difficulty of Paying Living Expenses: Not hard at all  Food Insecurity: No Food Insecurity (05/14/2022)   Hunger Vital Sign    Worried About Running Out of Food in the Last Year: Never true    Ran Out of Food in the Last Year: Never true  Transportation Needs: No Transportation Needs (05/14/2022)   PRAPARE - Administrator, Civil Service (Medical): No    Lack of Transportation (Non-Medical): No  Physical Activity: Insufficiently Active (05/14/2022)   Exercise Vital Sign    Days of Exercise per Week: 3 days    Minutes of Exercise per Session: 30 min  Stress: No Stress Concern Present (05/14/2022)   [employer] of Occupational Health - Occupational Stress Questionnaire    Feeling of Stress : Not at all  Social Connections: Moderately Integrated (05/14/2022)   Social Connection and Isolation Panel [NHANES]    Frequency of Communication with Friends and Family: More than three times a week    Frequency of Social Gatherings with Friends and Family: Three times a week    Attends Religious Services: More than 4 times per year    Active Member of Clubs or Organizations: Yes    Attends Occupational hygienist Meetings: More than 4 times per year    Marital Status: Widowed  Intimate Partner Violence: Not At Risk (05/14/2022)   Humiliation, Afraid, Rape, and Kick questionnaire    Fear of Current or Ex-Partner: No    Emotionally Abused: No    Physically Abused: No  Sexually Abused: No     Review of Systems     Objective:   Physical Exam Vitals reviewed.  Constitutional:      Appearance: Normal appearance.  Cardiovascular:     Rate and Rhythm: Normal rate and regular rhythm.     Heart sounds: Normal heart sounds. No murmur heard.    No friction rub. No gallop.  Pulmonary:     Effort: Pulmonary effort is normal.     Breath sounds: Normal breath sounds.  Abdominal:     General: Abdomen is flat. Bowel sounds are normal. There is no distension.     Palpations: Abdomen is soft.     Tenderness: There is no abdominal tenderness. There is no right CVA tenderness, guarding or rebound.  Musculoskeletal:     Right lower leg: No edema.     Left lower leg: No edema.  Neurological:     Mental Status: She is alert.           Assessment & Plan:  Fecal soiling due to fecal incontinence - Plan: DG Abd 2 Views  Needs flu shot - Plan: Flu Vaccine Trivalent High Dose (Fluad) I will get an x-ray of the abdomen and pelvis to evaluate for any overflow diarrhea secondary to constipation.  If the abdomen pelvis does not show a large stool burden, I plan to start the patient on cholestyramine, fiber, and a probiotic for fecal leakage

## 2023-01-04 NOTE — Telephone Encounter (Signed)
Debra Brooks, MD  Darral Dash, LPN By my read on her xray, it looks like she has a lot of stool in her colon that needs to come out.  This might be causing the fecal soiling through increased pressure.  I think they need to start senokot 2 tabs daily to clean out the colon.  Still would be a good idea to add the probiotic and fiber (metamucil).  11/4 @ 4:07 pm: Pt's daughter advised and verbalized understanding of all. Mjp,lpn

## 2023-01-12 ENCOUNTER — Telehealth: Payer: Self-pay

## 2023-01-12 NOTE — Telephone Encounter (Signed)
Copied from CRM [id number]. Topic: Clinical - Medication Question >> Jan 12, 2023 12:28 PM Almira Coaster wrote: Reason for CRM: Patient daughter is calling regarding an over the counter probiotic, and other bowel movement medication that the doctor provider recommended. When taking them it caused her blood pressure to spike and she stopped. Daughter wants to know if there is an alternative.

## 2023-02-01 ENCOUNTER — Other Ambulatory Visit: Payer: PPO

## 2023-02-01 DIAGNOSIS — H40153 Residual stage of open-angle glaucoma, bilateral: Secondary | ICD-10-CM | POA: Diagnosis not present

## 2023-02-25 ENCOUNTER — Other Ambulatory Visit: Payer: Self-pay

## 2023-02-25 ENCOUNTER — Ambulatory Visit (HOSPITAL_COMMUNITY)
Admission: EM | Admit: 2023-02-25 | Discharge: 2023-02-25 | Disposition: A | Discharge status: Home / Self Care | Payer: PPO | Attending: Family Medicine | Admitting: Family Medicine

## 2023-02-25 ENCOUNTER — Encounter (HOSPITAL_COMMUNITY): Payer: Self-pay

## 2023-02-25 ENCOUNTER — Emergency Department (HOSPITAL_COMMUNITY): Payer: PPO

## 2023-02-25 ENCOUNTER — Emergency Department (HOSPITAL_COMMUNITY)
Admission: EM | Admit: 2023-02-25 | Discharge: 2023-02-25 | Disposition: A | Discharge status: Home / Self Care | Payer: PPO | Attending: Emergency Medicine | Admitting: Emergency Medicine

## 2023-02-25 DIAGNOSIS — K5900 Constipation, unspecified: Secondary | ICD-10-CM | POA: Diagnosis not present

## 2023-02-25 DIAGNOSIS — K7689 Other specified diseases of liver: Secondary | ICD-10-CM | POA: Diagnosis not present

## 2023-02-25 DIAGNOSIS — K8020 Calculus of gallbladder without cholecystitis without obstruction: Secondary | ICD-10-CM | POA: Diagnosis not present

## 2023-02-25 DIAGNOSIS — R1084 Generalized abdominal pain: Secondary | ICD-10-CM | POA: Insufficient documentation

## 2023-02-25 DIAGNOSIS — R1032 Left lower quadrant pain: Secondary | ICD-10-CM | POA: Insufficient documentation

## 2023-02-25 DIAGNOSIS — K5730 Diverticulosis of large intestine without perforation or abscess without bleeding: Secondary | ICD-10-CM | POA: Diagnosis not present

## 2023-02-25 DIAGNOSIS — Z85831 Personal history of malignant neoplasm of soft tissue: Secondary | ICD-10-CM | POA: Diagnosis not present

## 2023-02-25 DIAGNOSIS — D72819 Decreased white blood cell count, unspecified: Secondary | ICD-10-CM | POA: Insufficient documentation

## 2023-02-25 LAB — POCT URINALYSIS DIP (MANUAL ENTRY)
Blood, UA: NEGATIVE
Glucose, UA: NEGATIVE mg/dL
Ketones, POC UA: NEGATIVE mg/dL
Leukocytes, UA: NEGATIVE
Nitrite, UA: NEGATIVE
Protein Ur, POC: 30 mg/dL — AB
Spec Grav, UA: 1.025 (ref 1.010–1.025)
Urobilinogen, UA: 1 U/dL
pH, UA: 5.5 (ref 5.0–8.0)

## 2023-02-25 LAB — COMPREHENSIVE METABOLIC PANEL
ALT: 13 U/L (ref 0–44)
AST: 18 U/L (ref 15–41)
Albumin: 4.1 g/dL (ref 3.5–5.0)
Alkaline Phosphatase: 51 U/L (ref 38–126)
Anion gap: 9 (ref 5–15)
BUN: 11 mg/dL (ref 8–23)
CO2: 23 mmol/L (ref 22–32)
Calcium: 9.5 mg/dL (ref 8.9–10.3)
Chloride: 107 mmol/L (ref 98–111)
Creatinine, Ser: 0.83 mg/dL (ref 0.44–1.00)
GFR, Estimated: >60 mL/min (ref >60)
Glucose, Bld: 108 mg/dL — ABNORMAL HIGH (ref 70–99)
Potassium: 4 mmol/L (ref 3.5–5.1)
Sodium: 139 mmol/L (ref 135–145)
Total Bilirubin: 0.8 mg/dL (ref <1.2)
Total Protein: 6.8 g/dL (ref 6.5–8.1)

## 2023-02-25 LAB — CBC
HCT: 44.3 % (ref 36.0–46.0)
Hemoglobin: 14.9 g/dL (ref 12.0–15.0)
MCH: 29.9 pg (ref 26.0–34.0)
MCHC: 33.6 g/dL (ref 30.0–36.0)
MCV: 89 fL (ref 80.0–100.0)
Platelets: 185 10*3/uL (ref 150–400)
RBC: 4.98 MIL/uL (ref 3.87–5.11)
RDW: 13.8 % (ref 11.5–15.5)
WBC: 3.9 10*3/uL — ABNORMAL LOW (ref 4.0–10.5)
nRBC: 0 % (ref 0.0–0.2)

## 2023-02-25 LAB — I-STAT CHEM 8, ED
BUN: 13 mg/dL (ref 8–23)
Calcium, Ion: 1.09 mmol/L — ABNORMAL LOW (ref 1.15–1.40)
Chloride: 109 mmol/L (ref 98–111)
Creatinine, Ser: 0.8 mg/dL (ref 0.44–1.00)
Glucose, Bld: 106 mg/dL — ABNORMAL HIGH (ref 70–99)
HCT: 44 % (ref 36.0–46.0)
Hemoglobin: 15 g/dL (ref 12.0–15.0)
Potassium: 3.9 mmol/L (ref 3.5–5.1)
Sodium: 139 mmol/L (ref 135–145)
TCO2: 22 mmol/L (ref 22–32)

## 2023-02-25 LAB — LIPASE, BLOOD: Lipase: 33 U/L (ref 11–51)

## 2023-02-25 MED ORDER — IOHEXOL 350 MG/ML SOLN
75.0000 mL | Freq: Once | INTRAVENOUS | Status: AC | PRN
  Administered 2023-02-25: 75 mL via INTRAVENOUS

## 2023-02-25 NOTE — Discharge Instructions (Addendum)
You have been seen here in the emergency department for abdominal pain. We have obtained a full history, performed a physical exam, in addition to other diagnostic tests and treatments. Right now, we feel that you are safe for discharge from a medical perspective, and do not have an acute life threatening illness.   To do: 1.) Take all medications as prescribed.   2.) If anything changes, or you develop fevers, chills, inability to eat or drink, severe pain, new symptoms, return of symptoms, worsening of symptoms, or any other concerns, please call 911 or come back to the emergency department as soon as possible.   3.) Please make an appointment with your primary care doctor for a follow-up visit after being seen here in the emergency department. If you do not have a primary care doctor, you can call [phone] for assistance in finding one or your health care insurance company.    4.) Follow up with gyn onc  Thank you for allowing me to take care of you today. We hope that you feel better soon.

## 2023-02-25 NOTE — ED Notes (Signed)
Patient is being discharged from the Urgent Care and sent to the Emergency Department via personal opperated vehicle  . Per Dr Marlinda Mike, patient is in need of higher level of care due to abdominal pain. Patient is aware and verbalizes understanding of plan of care.  Vitals:   02/25/23 1108  BP: (!) 161/88  Pulse: 72  Resp: 18  Temp: 98.1 F (36.7 C)  SpO2: 98%

## 2023-02-25 NOTE — ED Provider Notes (Signed)
[facility]    CSN: [id number] Arrival date & time: 02/25/23  1052      History   Chief Complaint Chief Complaint  Patient presents with   Abdominal Pain    HPI Debra [name] is a 88 y.o. female.    Abdominal Pain Here for LLQ and suprapubic pain, bothering her since yesterday AM. No fever or chills and no nausea or vomiting.  Appetite has been reduced   She does have a history of diverticulosis and she also is being followed for a longstanding left ovarian mass/cyst/scar tissue.  Last bowel movement was yesterday.  No diarrhea and no blood in the stool    Past Medical History:  Diagnosis Date   Arthritis    Phreesia 07/02/2020   Brain mass    monitor by Dr Dutch Quint - right posterior fossa meningioma - thinks it's benign per patient   Cancer (HCC)    hand, face - spots removed   Constipation    Degeneration of lumbar or lumbosacral intervertebral disc    arthritis in back, hips and hands - otc med prn   Diarrhea    Diverticulosis of colon with hemorrhage    Dysrhythmia    Hx irregular heart beat - 15 yrs ago   Female stress incontinence    Glaucoma    Phreesia 07/02/2020   Headache    otc med prn   Hypertension    Lumbago    Missed abortion    x 2 - no surgery required   Paroxysmal supraventricular tachycardia (HCC)    Hx   PONV (postoperative nausea and vomiting)    Pure hypercholesterolemia    SVD (spontaneous vaginal delivery)    x 4   Unspecified glaucoma(365.9)    bilateral    Patient Active Problem List   Diagnosis Date Noted   Polymyalgia rheumatica (HCC) 12/11/2020   Positive ANA (antinuclear antibody) 12/11/2020   Hearing impaired 05/03/2019   Osteoarthritis of both knees 08/26/2016   Osteoarthritis, hand 08/26/2016   Peripheral edema 05/22/2014   Meningioma (HCC) 04/29/2012   Vestibular schwannoma (HCC) 03/26/2012   Vitamin D deficiency 09/24/2009   Vitamin B12 deficiency 07/05/2009   OSTEOPENIA 10/16/2008    Essential hypertension, benign 09/05/2008   MELANOMA, HX OF 09/05/2008   ISCHEMIC COLITIS, HX OF 05/26/2007   Unspecified glaucoma 04/21/2006   FEMALE STRESS INCONTINENCE 04/21/2006   DEGENERATIVE DISC DISEASE, LUMBAR SPINE 04/21/2006   DIVERTICULOSIS, COLON W/HEM 12/01/2005    Past Surgical History:  Procedure Laterality Date   ABDOMINAL HYSTERECTOMY     APPENDECTOMY     COLONOSCOPY     EYE SURGERY     bilateral cataract eye surgery   EYE SURGERY     laser to relieve eye pressure - bilateral   LAPAROTOMY N/A 02/06/2014   Procedure: EXPLORATORY LAPAROTOMY;  Surgeon: Willodean Rosenthal, MD;  Location: [facility];  Service: Gynecology;  Laterality: N/A;   MULTIPLE TOOTH EXTRACTIONS     upper   right hand surgery     ganglion cyst removed   TUBAL LIGATION      OB History     Gravida  6   Para  4   Term  4   Preterm  0   AB  2   Living  4      SAB  2   IAB  0   Ectopic  0   Multiple  0   Live Births  4  Home Medications    Prior to Admission medications   Medication Sig Start Date End Date Taking? Authorizing Provider  acetaminophen (TYLENOL) 325 MG tablet Take 650 mg by mouth every 6 (six) hours as needed.   Yes [provider]  amLODipine (NORVASC) 10 MG tablet TAKE ONE TABLET BY MOUTH ONCE DAILY (PLEASE KEEP ANNUAL VISIT FOR FUTURE REFILLS) 11/25/22  Yes Donita Brooks, MD  atenolol (TENORMIN) 50 MG tablet TAKE ONE TABLET BY MOUTH ONCE A DAY (PLEASE KEEP ANNUAL VISIT FOR FUTURE REFILLS) 07/08/22  Yes Donita Brooks, MD  brimonidine (ALPHAGAN) 0.2 % ophthalmic solution Place 1 drop into both eyes 2 (two) times daily. 02/17/11  Yes [provider]  cholecalciferol (VITAMIN D3) 25 MCG (1000 UNIT) tablet Take 1,000 Units by mouth daily.   Yes [provider]  cloNIDine (CATAPRES) 0.1 MG tablet TAKE 1 TABLET BY MOUTH IN THE MORNING AND 2 TABLETS BY MOUTH AT BEDTIME Patient taking differently: Take 0.1 mg by mouth 2  (two) times daily. 1/2 tablet in AM and 1/2 at bedtime. 02/02/22  Yes Donita Brooks, MD  latanoprost (XALATAN) 0.005 % ophthalmic solution Place 1 drop into both eyes at bedtime. 02/17/11  Yes [provider]  lisinopril (ZESTRIL) 40 MG tablet TAKE 1 TABLET BY MOUTH DAILY 03/13/22  Yes Donita Brooks, MD  polyethylene glycol powder (GLYCOLAX/MIRALAX) 17 GM/SCOOP powder Take 1 Container by mouth as needed for moderate constipation.   Yes [provider]  pravastatin (PRAVACHOL) 80 MG tablet TAKE ONE TABLET BY MOUTH ONCE A DAY (PLEASE KEEP ANNUAL VISIT FOR FUTURE REFILLS) 08/07/22  Yes Donita Brooks, MD  timolol (TIMOPTIC) 0.5 % ophthalmic solution Place 1 drop into both eyes 2 (two) times daily. 06/30/12  Yes [provider]  traMADol (ULTRAM) 50 MG tablet Take 50 mg by mouth every 6 (six) hours as needed.   Yes [provider]    Family History Family History  Problem Relation Age of Onset   Aneurysm Mother    Alzheimer's disease Mother    Prostate cancer Father    Cancer Sister        unknown kind, metastatic   Arthritis Sister    Prostate cancer Brother    Heart disease Brother    Hypertension Brother    Glaucoma Brother    Prostate cancer Brother    Diabetes Brother    Heart disease Brother    Glaucoma Brother    Breast cancer Paternal Aunt    Colon cancer Neg Hx    Ovarian cancer Neg Hx    Endometrial cancer Neg Hx    Pancreatic cancer Neg Hx     Social History Social History   Tobacco Use   Smoking status: Never   Smokeless tobacco: Never  Vaping Use   Vaping status: Never Used  Substance Use Topics   Alcohol use: No   Drug use: No     Allergies   Codeine phosphate, Lipitor [atorvastatin], Pneumococcal vaccines, Sulfa antibiotics, and Latex   Review of Systems Review of Systems  Gastrointestinal:  Positive for abdominal pain.     Physical Exam Triage Vital Signs ED Triage Vitals  Encounter Vitals Group     BP  02/25/23 1108 (!) 161/88     Systolic BP Percentile --      Diastolic BP Percentile --      Pulse Rate 02/25/23 1108 72     Resp 02/25/23 1108 18     Temp 02/25/23  1108 98.1 F (36.7 C)     Temp Source 02/25/23 1108 Oral     SpO2 02/25/23 1108 98 %     Weight 02/25/23 1108 159 lb 6.3 oz (72.3 kg)     Height 02/25/23 1108 5\' 3"  (1.6 m)     Head Circumference --      Peak Flow --      Pain Score 02/25/23 1105 8     Pain Loc --      Pain Education --      Exclude from Growth Chart --    No data found.  Updated Vital Signs BP (!) 161/88 (BP Location: Left Arm)   Pulse 72   Temp 98.1 F (36.7 C) (Oral)   Resp 18   Ht 5\' 3"  (1.6 m)   Wt 72.3 kg   SpO2 98%   BMI 28.24 kg/m   Visual Acuity Right Eye Distance:   Left Eye Distance:   Bilateral Distance:    Right Eye Near:   Left Eye Near:    Bilateral Near:     Physical Exam Vitals reviewed.  Constitutional:      General: She is not in acute distress.    Appearance: She is not ill-appearing, toxic-appearing or diaphoretic.  HENT:     Right Ear: Tympanic membrane and ear canal normal.     Left Ear: Tympanic membrane and ear canal normal.     Nose: Nose normal.     Mouth/Throat:     Mouth: Mucous membranes are moist.     Pharynx: No oropharyngeal exudate or posterior oropharyngeal erythema.  Eyes:     Extraocular Movements: Extraocular movements intact.     Conjunctiva/sclera: Conjunctivae normal.     Pupils: Pupils are equal, round, and reactive to light.  Cardiovascular:     Rate and Rhythm: Normal rate and regular rhythm.     Heart sounds: No murmur heard. Pulmonary:     Effort: Pulmonary effort is normal. No respiratory distress.     Breath sounds: No stridor. No wheezing, rhonchi or rales.  Abdominal:     Palpations: Abdomen is soft.     Comments: Her only tenderness is in the epigastrium.  Normoactive bowel sounds are present.  No mass noted on exam  Musculoskeletal:     Cervical back: Neck supple.   Lymphadenopathy:     Cervical: No cervical adenopathy.  Skin:    Coloration: Skin is not jaundiced or pale.  Neurological:     General: No focal deficit present.     Mental Status: She is alert and oriented to person, place, and time.  Psychiatric:        Behavior: Behavior normal.      UC Treatments / Results  Labs (all labs ordered are listed, but only abnormal results are displayed) Labs Reviewed  POCT URINALYSIS DIP (MANUAL ENTRY) - Abnormal; Notable for the following components:      Result Value   Color, UA straw (*)    Clarity, UA hazy (*)    Bilirubin, UA small (*)    Protein Ur, POC =30 (*)    All other components within normal limits    EKG   Radiology No results found.  Procedures Procedures (including critical care time)  Medications Ordered in UC Medications - No data to display  Initial Impression / Assessment and Plan / UC Course  I have reviewed the triage vital signs and the nursing notes.  Pertinent labs & imaging results that were  available during my care of the patient were reviewed by me and considered in my medical decision making (see chart for details).     There is a little bit of protein and bilirubin on her urinalysis, but no nitrites, white blood cells, or red blood cells.  I have asked the patient and her daughter to proceed to the emergency room by private car for further evaluation. Final Clinical Impressions(s) / UC Diagnoses   Final diagnoses:  Left lower quadrant abdominal pain     Discharge Instructions      Please go to the ER for further evaluation    ED Prescriptions   None    PDMP not reviewed this encounter.   Zenia Resides, MD 02/25/23 1134

## 2023-02-25 NOTE — Discharge Instructions (Signed)
Please go to the ER for further evaluation °

## 2023-02-25 NOTE — ED Triage Notes (Signed)
Patient reports throbbing pain in LLQ, since yesterday. Patient has hx of diverticulosis and scar tissue with fluid around her left ovary. Patient seen at [facility] today, sent here after no concerns in her UA.

## 2023-02-25 NOTE — ED Provider Notes (Cosign Needed)
[facility] EMERGENCY DEPARTMENT AT [facility] Provider Note  HPI   Debra [name] is a 88 y.o. female patient with a PMHx of diverticulosis, colitis, here today after having lower left quadrant abdominal pain.  Patient states that for the past 24 hours, she has had a throbbing intermittent left lower quadrant pain since yesterday.  She is concerned this could be diverticulosis versus this fluid around her left ovary.  She was initially seen at urgent care earlier today, however they felt that she needed to come in for further workup.  She has had no nausea no vomiting no fevers chills no urinary symptoms.  She does state that 1 time she urinated and it made the pain actually feel better.  No constipation or diarrhea recently  ROS Negative except as per HPI  Medical Decision Making   Upon presentation, the patient is afebrile, blood pressure in the 160s on my independent exam, she is not tachycardic, she does have left lower quadrant abdominal pain.  From talking to the patient, she has this known left pelvis area of concern, her and her daughter state that this has been intermittently present since 2015, did have a surgery back in 2015, and they felt that it was benign at the time.  However per chart review and talking to them, looks like it has been growing over the past couple of months.  She did have an MRI and it in September.  Today prior to me seeing the patient she already had a large medical workup done which shows, a creatinine of 0.83, normal electrolyte panel, glucose normal, mild leukopenia at 3.9, lipase normal, we are still pending a urine, however CT abdomen pelvis shows   1. Constipation and diverticulosis.  2. Cholelithiasis.  3. Multiple hepatic cysts.  4. Left adnexal cystic mass measuring 13 cm representing an increase  in size. Tissue diagnosis may be necessary to exclude a neoplastic  process.  5. Aortic Atherosclerosis (ICD10-I70.0).    For this  patient, I am clinically concerned that this enlarging left adnexal cystic mass now 13 cm, could represent where her pain is.  I am going to reach out to the [place] unk team, it appears in their note, that they would consider some type of surgical decompression if patient were to become symptomatic.  She does have an appointment with this patient in the next few weeks, and was planned to have a CT scan done in the next week.  We have essentially done the CT a week early, will reach out to them to see if there is anything else they would like to do emergently now   Will treat the patient's constipation with MiraLAX, discussed this with the family, discussed the case with the OB/GYN oncology team on call.  Potentially going to have their appointment moved up as well.  Will follow-up with them.  They feel that the constipation could be causing pain which is true as the patient is to have a good bowel movement a couple days.  Patient has constant constipation issues, does not take in her current regiment she will go back on it, discussed this with multiple family was in the room.   At this time, I feel that the patient is medically cleared for discharge and have discussed this with my attending who agrees.  I discussed with the patient and/or family my overall assessment, including my physical exam, labs, imaging, other diagnostic tests, and therapeutics given.  All questions answered and understanding  is expressed.  I have instructed to call PCP to establish an outpatient appointment after this ED visit, and necessary specialty follow up if needed. I gave strict return precautions to come back to the ED including fevers, chills, severe pain, worsening of symptoms, return of symptoms, new and concerning symptoms, inability to tolerate p.o. intake, among others. I specifically stated to return if symptoms worsen or return   1. Generalized abdominal pain     @DISPOSITION @  Rx / DC Orders ED Discharge Orders      None        Past Medical History:  Diagnosis Date   Arthritis    Phreesia 07/02/2020   Brain mass    monitor by Dr Dutch Quint - right posterior fossa meningioma - thinks it's benign per patient   Cancer (HCC)    hand, face - spots removed   Constipation    Degeneration of lumbar or lumbosacral intervertebral disc    arthritis in back, hips and hands - otc med prn   Diarrhea    Diverticulosis of colon with hemorrhage    Dysrhythmia    Hx irregular heart beat - 15 yrs ago   Female stress incontinence    Glaucoma    Phreesia 07/02/2020   Headache    otc med prn   Hypertension    Lumbago    Missed abortion    x 2 - no surgery required   Paroxysmal supraventricular tachycardia (HCC)    Hx   PONV (postoperative nausea and vomiting)    Pure hypercholesterolemia    SVD (spontaneous vaginal delivery)    x 4   Unspecified glaucoma(365.9)    bilateral   Past Surgical History:  Procedure Laterality Date   ABDOMINAL HYSTERECTOMY     APPENDECTOMY     COLONOSCOPY     EYE SURGERY     bilateral cataract eye surgery   EYE SURGERY     laser to relieve eye pressure - bilateral   LAPAROTOMY N/A 02/06/2014   Procedure: EXPLORATORY LAPAROTOMY;  Surgeon: Willodean Rosenthal, MD;  Location: [facility];  Service: Gynecology;  Laterality: N/A;   MULTIPLE TOOTH EXTRACTIONS     upper   right hand surgery     ganglion cyst removed   TUBAL LIGATION     Family History  Problem Relation Age of Onset   Aneurysm Mother    Alzheimer's disease Mother    Prostate cancer Father    Cancer Sister        unknown kind, metastatic   Arthritis Sister    Prostate cancer Brother    Heart disease Brother    Hypertension Brother    Glaucoma Brother    Prostate cancer Brother    Diabetes Brother    Heart disease Brother    Glaucoma Brother    Breast cancer Paternal Aunt    Colon cancer Neg Hx    Ovarian cancer Neg Hx    Endometrial cancer Neg Hx    Pancreatic cancer Neg Hx    Social History    Socioeconomic History   Marital status: Widowed    Spouse name: Not on file   Number of children: 4   Years of education: Not on file   Highest education level: Not on file  Occupational History   Occupation: raised tobacco    Employer: Retired  Tobacco Use   Smoking status: Never   Smokeless tobacco: Never  Vaping Use   Vaping status: Never Used  Substance and Sexual Activity  Alcohol use: No   Drug use: No   Sexual activity: Not Currently    Birth control/protection: None, Post-menopausal, Surgical  Other Topics Concern   Not on file  Social History Narrative    Has living will.  Daughter is HCPOA, Lenice Llamas.Full code.   3 sons and 1 daughter.   8 grandchildren   5 great grandchildren   Social Drivers of Corporate investment banker Strain: Low Risk  (05/14/2022)   Overall Financial Resource Strain (CARDIA)    Difficulty of Paying Living Expenses: Not hard at all  Food Insecurity: No Food Insecurity (05/14/2022)   Hunger Vital Sign    Worried About Running Out of Food in the Last Year: Never true    Ran Out of Food in the Last Year: Never true  Transportation Needs: No Transportation Needs (05/14/2022)   PRAPARE - Administrator, Civil Service (Medical): No    Lack of Transportation (Non-Medical): No  Physical Activity: Insufficiently Active (05/14/2022)   Exercise Vital Sign    Days of Exercise per Week: 3 days    Minutes of Exercise per Session: 30 min  Stress: No Stress Concern Present (05/14/2022)   [employer] of Occupational Health - Occupational Stress Questionnaire    Feeling of Stress : Not at all  Social Connections: Moderately Integrated (05/14/2022)   Social Connection and Isolation Panel [NHANES]    Frequency of Communication with Friends and Family: More than three times a week    Frequency of Social Gatherings with Friends and Family: Three times a week    Attends Religious Services: More than 4 times per year    Active Member  of Clubs or Organizations: Yes    Attends Banker Meetings: More than 4 times per year    Marital Status: Widowed  Intimate Partner Violence: Not At Risk (05/14/2022)   Humiliation, Afraid, Rape, and Kick questionnaire    Fear of Current or Ex-Partner: No    Emotionally Abused: No    Physically Abused: No    Sexually Abused: No     Physical Exam   Vitals:   02/25/23 1144 02/25/23 1149 02/25/23 1634  BP: (!) 178/78  (!) 163/77  Pulse: 76  88  Resp: 16  15  Temp: 98.3 F (36.8 C)  98.1 F (36.7 C)  SpO2: 97%  99%  Weight:  72.3 kg   Height:  5\' 3"  (1.6 m)     Physical Exam Vitals and nursing note reviewed.  Constitutional:      General: She is not in acute distress.    Appearance: She is well-developed.  HENT:     Head: Normocephalic and atraumatic.     Mouth/Throat:     Mouth: Mucous membranes are moist.  Eyes:     Extraocular Movements: Extraocular movements intact.     Conjunctiva/sclera: Conjunctivae normal.     Pupils: Pupils are equal, round, and reactive to light.  Cardiovascular:     Rate and Rhythm: Normal rate and regular rhythm.     Heart sounds: No murmur heard. Pulmonary:     Effort: Pulmonary effort is normal. No respiratory distress.     Breath sounds: Normal breath sounds.  Abdominal:     Palpations: Abdomen is soft.     Tenderness: There is abdominal tenderness (Left lower quadrant abdominal tenderness).  Musculoskeletal:        General: No swelling.     Cervical back: Neck supple.  Skin:  General: Skin is warm and dry.     Capillary Refill: Capillary refill takes less than 2 seconds.  Neurological:     Mental Status: She is alert.  Psychiatric:        Mood and Affect: Mood normal.      Procedures   If procedures were preformed on this patient, they are listed below:  Procedures  The patient was seen, evaluated, and treated in conjunction with the attending physician, who voiced agreement in the care provided.  Note  generated using Dragon voice dictation software and may contain dictation errors. Please contact me for any clarification or with any questions.   Electronically signed by:  Osvaldo Shipper, M.D. (PGY-2)    Gunnar Bulla, MD 02/25/23 [phone]

## 2023-02-25 NOTE — ED Triage Notes (Signed)
Lower abd pain and weakness for 1 Day. Throbbing raw pain in the suprapubic area and towards the left side. Patient has a history of diverticulosis but this feels different. No pain with going to the bathroom. NO NVD. Patient also being seen for scar tissue and fluid build up on the left ovary.   Patient tried tylenol with little relief.

## 2023-02-26 ENCOUNTER — Telehealth: Payer: Self-pay | Admitting: *Deleted

## 2023-02-26 ENCOUNTER — Telehealth: Payer: Self-pay

## 2023-02-26 ENCOUNTER — Ambulatory Visit: Payer: PPO | Admitting: Family Medicine

## 2023-02-26 NOTE — Telephone Encounter (Signed)
Pt's daughter Debra [name], called stating her mom, Debra [name], was seen in the ER last night. The scheduled CT that was scheduled for 03/04/23 was done in the ER lastnight (CT for 1/2 cancelled by our office) Debra [name] is asking if the scheduled labs on 1/6 still need to be drawn since labs were done in the ER?   Follow up call to Debra [name] will be done by our office today.

## 2023-02-26 NOTE — Telephone Encounter (Signed)
Spoke with Alcide Evener patient's daughter who states her mother is not having any abdominal pain this morning. She states her mother is not really listening to her family at this time because she doesn't believe the ED doctor's assessment and CT scan showing that she is constipated. She told her daughter that she is having daily bowel movements and doesn't need to take the miralax. Reiterated that the constipation will increase her abdominal pain and that by taking a dose daily with increased fluids will help alleviate the pain. Ms. Mcgugan denies fever, chills, nausea, vomiting, and all urinary symptoms.   Pt's appt. Was moved up with Dr. Alvester Morin on Monday, December 30 th at 2:45. Patient's daughter agreed to date and time of new appointment and thanked the office for calling.

## 2023-02-26 NOTE — Telephone Encounter (Signed)
-----   Message from Doylene Bode sent at 02/26/2023  9:25 AM EST -----  ----- Message ----- From: Carver Fila, MD Sent: 02/25/2023   5:24 PM EST To: Clide Cliff, MD; Doylene Bode, NP  Hi guys, Called by the emergency department about this patient presented with about a day of pain.  If anything, her mass looks smaller.  I recommended working to control her pain and also encouraged good bowel regimen.  I told them we would work to get her closer outpatient follow-up to discuss option of surgery again. Thanks, [employer]

## 2023-03-01 ENCOUNTER — Inpatient Hospital Stay: Payer: PPO | Admitting: Family Medicine

## 2023-03-01 ENCOUNTER — Other Ambulatory Visit: Payer: Self-pay | Admitting: Gynecologic Oncology

## 2023-03-01 ENCOUNTER — Inpatient Hospital Stay: Payer: PPO | Attending: Psychiatry | Admitting: Psychiatry

## 2023-03-01 ENCOUNTER — Inpatient Hospital Stay: Payer: PPO

## 2023-03-01 VITALS — BP 136/76 | HR 63 | Temp 98.4°F | Resp 20 | Wt 156.2 lb

## 2023-03-01 DIAGNOSIS — R1900 Intra-abdominal and pelvic swelling, mass and lump, unspecified site: Secondary | ICD-10-CM | POA: Diagnosis not present

## 2023-03-01 DIAGNOSIS — R1032 Left lower quadrant pain: Secondary | ICD-10-CM | POA: Insufficient documentation

## 2023-03-01 DIAGNOSIS — R978 Other abnormal tumor markers: Secondary | ICD-10-CM | POA: Diagnosis not present

## 2023-03-01 DIAGNOSIS — D3912 Neoplasm of uncertain behavior of left ovary: Secondary | ICD-10-CM | POA: Insufficient documentation

## 2023-03-01 DIAGNOSIS — K5900 Constipation, unspecified: Secondary | ICD-10-CM | POA: Insufficient documentation

## 2023-03-01 DIAGNOSIS — M3119 Other thrombotic microangiopathy: Secondary | ICD-10-CM | POA: Insufficient documentation

## 2023-03-01 NOTE — Progress Notes (Unsigned)
Gynecologic Oncology Return Clinic Visit  Date of Service: 03/01/2023 Referring Provider: Harvie Bridge, MD   Assessment & Plan: Debra [name] is a 88 y.o. woman with a 14cm cystic pelvic mss, ORADS3 (overread at [facility]) with normal tumor markers who presents for follow-up after recent ED visit for pain.  CT compared measurements to prior CT in 07/2022, but did not compare to the MRI 11/2022. Size is stable to slightly small since imaging in September.   Surgery Drainage Observation She wants to observe  ***  ***VTE Prophylaxis: - Khorana score = ***  ***Preventative Screening: {Preventative screening:28493}  RTC ***.  Clide Cliff, MD Gynecologic Oncology   Medical Decision Making I personally spent  TOTAL *** minutes face-to-face and non-face-to-face in the care of this patient, which includes all pre, intra, and post visit time on the date of service.  *** minutes spent reviewing records prior to the visit *** Minutes in patient contact      *** minutes in other billable services *** minutes charting , conferring with consultants etc.   ----------------------- Reason for Visit: ***  Treatment History: Oncology History   No history exists.    Interval History: ***was in the ER 02/25/23 for left sided pain CT scan with persistent cystic collection 02/26/23 took medications to clear out her colon  Takes miralax every day Fiber daily per her PCP And senna prn  Sometimes every other day, sometimes less frequent  LLQ sore Had to lay on her back Getting better day by day. But not gone  daughter  Past Medical/Surgical History: Past Medical History:  Diagnosis Date   Arthritis    Phreesia 07/02/2020   Brain mass    monitor by Dr Dutch Quint - right posterior fossa meningioma - thinks it's benign per patient   Cancer (HCC)    hand, face - spots removed   Constipation    Degeneration of lumbar or lumbosacral intervertebral disc    arthritis in back,  hips and hands - otc med prn   Diarrhea    Diverticulosis of colon with hemorrhage    Dysrhythmia    Hx irregular heart beat - 15 yrs ago   Female stress incontinence    Glaucoma    Phreesia 07/02/2020   Headache    otc med prn   Hypertension    Lumbago    Missed abortion    x 2 - no surgery required   Paroxysmal supraventricular tachycardia (HCC)    Hx   PONV (postoperative nausea and vomiting)    Pure hypercholesterolemia    SVD (spontaneous vaginal delivery)    x 4   Unspecified glaucoma(365.9)    bilateral    Past Surgical History:  Procedure Laterality Date   ABDOMINAL HYSTERECTOMY     APPENDECTOMY     COLONOSCOPY     EYE SURGERY     bilateral cataract eye surgery   EYE SURGERY     laser to relieve eye pressure - bilateral   LAPAROTOMY N/A 02/06/2014   Procedure: EXPLORATORY LAPAROTOMY;  Surgeon: Willodean Rosenthal, MD;  Location: [facility];  Service: Gynecology;  Laterality: N/A;   MULTIPLE TOOTH EXTRACTIONS     upper   right hand surgery     ganglion cyst removed   TUBAL LIGATION      Family History  Problem Relation Age of Onset   Aneurysm Mother    Alzheimer's disease Mother    Prostate cancer Father    Cancer Sister  unknown kind, metastatic   Arthritis Sister    Prostate cancer Brother    Heart disease Brother    Hypertension Brother    Glaucoma Brother    Prostate cancer Brother    Diabetes Brother    Heart disease Brother    Glaucoma Brother    Breast cancer Paternal Aunt    Colon cancer Neg Hx    Ovarian cancer Neg Hx    Endometrial cancer Neg Hx    Pancreatic cancer Neg Hx     Social History   Socioeconomic History   Marital status: Widowed    Spouse name: Not on file   Number of children: 4   Years of education: Not on file   Highest education level: Not on file  Occupational History   Occupation: raised tobacco    Employer: Retired  Tobacco Use   Smoking status: Never   Smokeless tobacco: Never  Vaping Use   Vaping  status: Never Used  Substance and Sexual Activity   Alcohol use: No   Drug use: No   Sexual activity: Not Currently    Birth control/protection: None, Post-menopausal, Surgical  Other Topics Concern   Not on file  Social History Narrative    Has living will.  Daughter is HCPOA, Lenice Llamas.Full code.   3 sons and 1 daughter.   8 grandchildren   5 great grandchildren   Social Drivers of Corporate investment banker Strain: Low Risk  (05/14/2022)   Overall Financial Resource Strain (CARDIA)    Difficulty of Paying Living Expenses: Not hard at all  Food Insecurity: No Food Insecurity (05/14/2022)   Hunger Vital Sign    Worried About Running Out of Food in the Last Year: Never true    Ran Out of Food in the Last Year: Never true  Transportation Needs: No Transportation Needs (05/14/2022)   PRAPARE - Administrator, Civil Service (Medical): No    Lack of Transportation (Non-Medical): No  Physical Activity: Insufficiently Active (05/14/2022)   Exercise Vital Sign    Days of Exercise per Week: 3 days    Minutes of Exercise per Session: 30 min  Stress: No Stress Concern Present (05/14/2022)   [employer] of Occupational Health - Occupational Stress Questionnaire    Feeling of Stress : Not at all  Social Connections: Moderately Integrated (05/14/2022)   Social Connection and Isolation Panel [NHANES]    Frequency of Communication with Friends and Family: More than three times a week    Frequency of Social Gatherings with Friends and Family: Three times a week    Attends Religious Services: More than 4 times per year    Active Member of Clubs or Organizations: Yes    Attends Banker Meetings: More than 4 times per year    Marital Status: Widowed    Current Medications:  Current Outpatient Medications:    acetaminophen (TYLENOL) 325 MG tablet, Take 650 mg by mouth every 6 (six) hours as needed., Disp: , Rfl:    amLODipine (NORVASC) 10 MG tablet, TAKE ONE  TABLET BY MOUTH ONCE DAILY (PLEASE KEEP ANNUAL VISIT FOR FUTURE REFILLS), Disp: 90 tablet, Rfl: 0   atenolol (TENORMIN) 50 MG tablet, TAKE ONE TABLET BY MOUTH ONCE A DAY (PLEASE KEEP ANNUAL VISIT FOR FUTURE REFILLS), Disp: 90 tablet, Rfl: 3   brimonidine (ALPHAGAN) 0.2 % ophthalmic solution, Place 1 drop into both eyes 2 (two) times daily., Disp: , Rfl:    cholecalciferol (VITAMIN D3) 25  MCG (1000 UNIT) tablet, Take 1,000 Units by mouth daily., Disp: , Rfl:    cloNIDine (CATAPRES) 0.1 MG tablet, TAKE 1 TABLET BY MOUTH IN THE MORNING AND 2 TABLETS BY MOUTH AT BEDTIME (Patient taking differently: Take 0.1 mg by mouth 2 (two) times daily. 1/2 tablet in AM and 1/2 at bedtime.), Disp: 270 tablet, Rfl: 3   latanoprost (XALATAN) 0.005 % ophthalmic solution, Place 1 drop into both eyes at bedtime., Disp: , Rfl:    lisinopril (ZESTRIL) 40 MG tablet, TAKE 1 TABLET BY MOUTH DAILY, Disp: 90 tablet, Rfl: 3   polyethylene glycol powder (GLYCOLAX/MIRALAX) 17 GM/SCOOP powder, Take 1 Container by mouth as needed for moderate constipation., Disp: , Rfl:    pravastatin (PRAVACHOL) 80 MG tablet, TAKE ONE TABLET BY MOUTH ONCE A DAY (PLEASE KEEP ANNUAL VISIT FOR FUTURE REFILLS), Disp: 90 tablet, Rfl: 0   timolol (TIMOPTIC) 0.5 % ophthalmic solution, Place 1 drop into both eyes 2 (two) times daily., Disp: , Rfl:    traMADol (ULTRAM) 50 MG tablet, Take 50 mg by mouth every 6 (six) hours as needed., Disp: , Rfl:   Review of Symptoms: Complete 10-system review is negative except as above in Interval History.  Physical Exam: BP (!) 156/74 (BP Location: Left Arm, Patient Position: Sitting) Comment: Notified RN  Pulse 63   Temp 98.4 F (36.9 C) (Oral)   Resp 20   Wt 156 lb 3.2 oz (70.9 kg)   SpO2 99%   BMI 27.67 kg/m  General: Alert, oriented, no acute distress. HEENT: Normocephalic, atraumatic. Neck symmetric without masses.  Chest: Normal work of breathing. Clear to auscultation bilaterally.   Cardiovascular:  Regular rate and rhythm, no murmurs. Abdomen: Soft.  Normoactive bowel sounds.  No masses appreciated.  Well-healed incisions. Moderate TTP of LLQ Extremities: Grossly normal range of motion.  Warm, well perfused.  No edema bilaterally. Skin: No rashes or lesions noted. GU: deferred per pt preference  Laboratory & Radiologic Studies: CT ABDOMEN PELVIS W CONTRAST 02/25/2023  Narrative CLINICAL DATA:  LLQ pain.  EXAM: CT ABDOMEN AND PELVIS WITH CONTRAST  TECHNIQUE: Multidetector CT imaging of the abdomen and pelvis was performed using the standard protocol following bolus administration of intravenous contrast.  RADIATION DOSE REDUCTION: This exam was performed according to the departmental dose-optimization program which includes automated exposure control, adjustment of the mA and/or kV according to patient size and/or use of iterative reconstruction technique.  CONTRAST:  75 mL OMNIPAQUE IOHEXOL 350 MG/ML SOLN  COMPARISON:  07/29/2022.  FINDINGS: Lower chest: Dependent subsegmental atelectasis. No pleural or pericardial effusion. Cardiomegaly.  Hepatobiliary: Numerous hepatic cysts measuring up to 1.8 cm. Large calcified gallstone. No pericholecystic inflammatory changes. No biliary ductal dilatation.  Pancreas: Unremarkable. No pancreatic ductal dilatation or surrounding inflammatory changes.  Spleen: Normal in size without focal abnormality.  Adrenals/Urinary Tract: No adrenal lesions. Extrarenal pelves bilaterally. No renal parenchymal abnormalities. No stones or hydronephrosis. Unremarkable urinary bladder.  Stomach/Bowel: No bowel dilatation to suggest obstruction. Appendix not visualized and no evidence of appendicitis increased stool consistent with constipation. Diverticulosis descending and sigmoid.  Vascular/Lymphatic: Aortic atherosclerosis. No enlarged abdominal or pelvic lymph nodes.  Reproductive: Left adnexal cystic mass measuring 13 x 12 x 10  cm. This represents a further increase in size compared to the prior study. Tissue diagnosis may be necessary to exclude a neoplastic process.  Other: Postop changes abdominal wall lower midabdomen. Anterior abdominal wall periumbilical defect containing omental fat. No abdominopelvic ascites.  Musculoskeletal: Grade 1 L5 retrolisthesis.  Degenerative disc disease. No acute osseous abnormalities.  IMPRESSION: 1. Constipation and diverticulosis. 2. Cholelithiasis. 3. Multiple hepatic cysts. 4. Left adnexal cystic mass measuring 13 cm representing an increase in size. Tissue diagnosis may be necessary to exclude a neoplastic process. 5. Aortic Atherosclerosis (ICD10-I70.0).   Electronically Signed By: Layla Maw M.D. On: 02/25/2023 14:42

## 2023-03-01 NOTE — Patient Instructions (Addendum)
It was a pleasure to see you in clinic today. - CA125 today - Continue with the miralax and the fiber as you are doing. Senna you can start taking 1 tablet every other day and see how you do.  - Your goals is to have a formed bowel movement every day or every other day. If you are going longer, then increase the senna to 1 tablet every day. - If having loose stools, you can back off of the the senna. - Return visit planned for 2 months  Thank you very much for allowing me to provide care for you today.  I appreciate your confidence in choosing our Gynecologic Oncology team at [facility].  If you have any questions about your visit today please call our office or send [place] a MyChart message and we will get back to you as soon as possible.

## 2023-03-02 LAB — CA 125: Cancer Antigen (CA) 125: 12.9 U/mL (ref 0.0–38.1)

## 2023-03-04 ENCOUNTER — Other Ambulatory Visit: Payer: Self-pay | Admitting: Family Medicine

## 2023-03-04 ENCOUNTER — Other Ambulatory Visit: Payer: PPO

## 2023-03-04 ENCOUNTER — Encounter: Payer: Self-pay | Admitting: Family Medicine

## 2023-03-04 ENCOUNTER — Ambulatory Visit (INDEPENDENT_AMBULATORY_CARE_PROVIDER_SITE_OTHER): Payer: PPO | Admitting: Family Medicine

## 2023-03-04 VITALS — BP 134/78 | HR 66 | Temp 98.4°F | Ht 63.0 in | Wt 157.5 lb

## 2023-03-04 DIAGNOSIS — K5904 Chronic idiopathic constipation: Secondary | ICD-10-CM | POA: Diagnosis not present

## 2023-03-04 NOTE — Progress Notes (Signed)
 Subjective:    Patient ID: Debra [name], female    DOB: 1934/10/16, 89 y.o.   MRN: [id number]  Patient recently went to the emergency room with left-sided abdominal pain.  CT scan again showed the left apical mass.  It is likely a slowly enlarging plaque.  She is seeing gynecology and due to her age they have recommended clinical monitoring.  She is seeing them again in 6 weeks.  They are hesitant to do surgery due to potential risk.  Patient states that she has gone a week without having a bowel movement.  This is despite her taking MiraLAX every day, Senokot 2 tablets daily and fiber.  This does not seem to be helping.  I performed a rectal exam today and there is no fecal impaction.  Daughter admits the patient is not drinking very much.  Past Medical History:  Diagnosis Date   Arthritis    Phreesia 07/02/2020   Brain mass    monitor by Dr Malcolm - right posterior fossa meningioma - thinks it's benign per patient   Cancer (HCC)    hand, face - spots removed   Constipation    Degeneration of lumbar or lumbosacral intervertebral disc    arthritis in back, hips and hands - otc med prn   Diarrhea    Diverticulosis of colon with hemorrhage    Dysrhythmia    Hx irregular heart beat - 15 yrs ago   Female stress incontinence    Glaucoma    Phreesia 07/02/2020   Headache    otc med prn   Hypertension    Lumbago    Missed abortion    x 2 - no surgery required   Paroxysmal supraventricular tachycardia (HCC)    Hx   PONV (postoperative nausea and vomiting)    Pure hypercholesterolemia    SVD (spontaneous vaginal delivery)    x 4   Unspecified glaucoma(365.9)    bilateral   Past Surgical History:  Procedure Laterality Date   ABDOMINAL HYSTERECTOMY     APPENDECTOMY     COLONOSCOPY     EYE SURGERY     bilateral cataract eye surgery   EYE SURGERY     laser to relieve eye pressure - bilateral   LAPAROTOMY N/A 02/06/2014   Procedure: EXPLORATORY LAPAROTOMY;  Surgeon: Elveria Mungo, MD;  Location: [facility];  Service: Gynecology;  Laterality: N/A;   MULTIPLE TOOTH EXTRACTIONS     upper   right hand surgery     ganglion cyst removed   TUBAL LIGATION     Current Outpatient Medications on File Prior to Visit  Medication Sig Dispense Refill   acetaminophen  (TYLENOL ) 325 MG tablet Take 650 mg by mouth every 6 (six) hours as needed.     amLODipine  (NORVASC ) 10 MG tablet TAKE ONE TABLET BY MOUTH ONCE DAILY (PLEASE KEEP ANNUAL VISIT FOR FUTURE REFILLS) 90 tablet 0   atenolol  (TENORMIN ) 50 MG tablet TAKE ONE TABLET BY MOUTH ONCE A DAY (PLEASE KEEP ANNUAL VISIT FOR FUTURE REFILLS) 90 tablet 3   brimonidine  (ALPHAGAN ) 0.2 % ophthalmic solution Place 1 drop into both eyes 2 (two) times daily.     cholecalciferol (VITAMIN D3) 25 MCG (1000 UNIT) tablet Take 1,000 Units by mouth daily.     cloNIDine  (CATAPRES ) 0.1 MG tablet TAKE 1 TABLET BY MOUTH IN THE MORNING AND 2 TABLETS BY MOUTH AT BEDTIME (Patient taking differently: Take 0.1 mg by mouth 2 (two) times daily. 1/2 tablet in AM and  1/2 at bedtime.) 270 tablet 3   latanoprost  (XALATAN ) 0.005 % ophthalmic solution Place 1 drop into both eyes at bedtime.     lisinopril  (ZESTRIL ) 40 MG tablet TAKE 1 TABLET BY MOUTH DAILY 90 tablet 3   polyethylene glycol powder (GLYCOLAX/MIRALAX) 17 GM/SCOOP powder Take 1 Container by mouth as needed for moderate constipation.     pravastatin  (PRAVACHOL ) 80 MG tablet TAKE ONE TABLET BY MOUTH ONCE A DAY (PLEASE KEEP ANNUAL VISIT FOR FUTURE REFILLS) 90 tablet 0   timolol  (TIMOPTIC ) 0.5 % ophthalmic solution Place 1 drop into both eyes 2 (two) times daily.     traMADol  (ULTRAM ) 50 MG tablet Take 50 mg by mouth every 6 (six) hours as needed.     No current facility-administered medications on file prior to visit.   Allergies  Allergen Reactions   Codeine Phosphate Nausea And Vomiting    REACTION: unspecified   Lipitor [Atorvastatin ]    Pneumococcal Vaccines Hives   Sulfa Antibiotics      unknown   Latex Hives, Itching and Rash   Social History   Socioeconomic History   Marital status: Widowed    Spouse name: Not on file   Number of children: 4   Years of education: Not on file   Highest education level: Not on file  Occupational History   Occupation: raised tobacco    Employer: Retired  Tobacco Use   Smoking status: Never   Smokeless tobacco: Never  Vaping Use   Vaping status: Never Used  Substance and Sexual Activity   Alcohol use: No   Drug use: No   Sexual activity: Not Currently    Birth control/protection: None, Post-menopausal, Surgical  Other Topics Concern   Not on file  Social History Narrative    Has living will.  Daughter is HCPOA, Lamarr Peach.Full code.   3 sons and 1 daughter.   8 grandchildren   5 great grandchildren   Social Drivers of Corporate Investment Banker Strain: Low Risk  (05/14/2022)   Overall Financial Resource Strain (CARDIA)    Difficulty of Paying Living Expenses: Not hard at all  Food Insecurity: No Food Insecurity (05/14/2022)   Hunger Vital Sign    Worried About Running Out of Food in the Last Year: Never true    Ran Out of Food in the Last Year: Never true  Transportation Needs: No Transportation Needs (05/14/2022)   PRAPARE - Administrator, Civil Service (Medical): No    Lack of Transportation (Non-Medical): No  Physical Activity: Insufficiently Active (05/14/2022)   Exercise Vital Sign    Days of Exercise per Week: 3 days    Minutes of Exercise per Session: 30 min  Stress: No Stress Concern Present (05/14/2022)   [employer] of Occupational Health - Occupational Stress Questionnaire    Feeling of Stress : Not at all  Social Connections: Moderately Integrated (05/14/2022)   Social Connection and Isolation Panel [NHANES]    Frequency of Communication with Friends and Family: More than three times a week    Frequency of Social Gatherings with Friends and Family: Three times a week    Attends  Religious Services: More than 4 times per year    Active Member of Clubs or Organizations: Yes    Attends Banker Meetings: More than 4 times per year    Marital Status: Widowed  Intimate Partner Violence: Not At Risk (05/14/2022)   Humiliation, Afraid, Rape, and Kick questionnaire    Fear  of Current or Ex-Partner: No    Emotionally Abused: No    Physically Abused: No    Sexually Abused: No     Review of Systems     Objective:   Physical Exam Vitals reviewed.  Constitutional:      Appearance: Normal appearance.  Cardiovascular:     Rate and Rhythm: Normal rate and regular rhythm.     Heart sounds: Normal heart sounds. No murmur heard.    No friction rub. No gallop.  Pulmonary:     Effort: Pulmonary effort is normal.     Breath sounds: Normal breath sounds.  Abdominal:     General: Bowel sounds are increased.     Palpations: There is mass.     Tenderness: There is abdominal tenderness in the suprapubic area and left lower quadrant.  Genitourinary:    Rectum: Guaiac result negative. No mass or tenderness. Normal anal tone.  Musculoskeletal:     Right lower leg: No edema.     Left lower leg: No edema.  Neurological:     Mental Status: She is alert.           Assessment & Plan:  Chronic idiopathic constipation Patient states that she has dealt with constipation her whole life.  She states that her mother used to give her milk and molasses to make her bowels move.  Patient has tried and failed MiraLAX as well as Senokot and fiber.  Therefore I recommended that we try Linzess  292 mcg p.o. daily coupled with Senokot 2 tablets daily and also encouraged the patient to drink more fluid of any type.  Obviously if the patient develops diarrhea we will decrease the dose of Linzess  and discontinue Senokot.  However, I feel that she needs a stimulant laxative based on her history.

## 2023-03-08 ENCOUNTER — Ambulatory Visit: Payer: PPO | Admitting: Psychiatry

## 2023-03-08 ENCOUNTER — Other Ambulatory Visit: Payer: PPO

## 2023-03-08 ENCOUNTER — Inpatient Hospital Stay: Payer: PPO

## 2023-03-09 ENCOUNTER — Encounter: Payer: Self-pay | Admitting: Psychiatry

## 2023-03-15 ENCOUNTER — Ambulatory Visit: Payer: PPO | Admitting: Psychiatry

## 2023-03-16 ENCOUNTER — Other Ambulatory Visit: Payer: Self-pay

## 2023-03-16 ENCOUNTER — Telehealth: Payer: Self-pay | Admitting: Family Medicine

## 2023-03-16 DIAGNOSIS — R159 Full incontinence of feces: Secondary | ICD-10-CM

## 2023-03-16 DIAGNOSIS — K5904 Chronic idiopathic constipation: Secondary | ICD-10-CM

## 2023-03-16 MED ORDER — LINACLOTIDE 290 MCG PO CAPS: 290.0000 ug | ORAL_CAPSULE | Freq: Every day | ORAL | 3 refills | Status: DC

## 2023-03-16 NOTE — Telephone Encounter (Signed)
 Copied from CRM [id number]. Topic: Clinical - Medication Refill >> Mar 16, 2023 12:29 PM Graeme ORN wrote: Most Recent Primary Care Visit:  Provider: DUANNE LOWERS T  Department: [facility]  Visit Type: HOSPITAL FOLLOW UP  Date: 03/04/2023  Medication: Linzess  - patient states provider gave her samples and told her to call if it worked well and her would order it for her.   Has the patient contacted their pharmacy? No (Agent: If no, request that the patient contact the pharmacy for the refill. If patient does not wish to contact the pharmacy document the reason why and proceed with request.) (Agent: If yes, when and what did the pharmacy advise?)  Is this the correct pharmacy for this prescription? Yes If no, delete pharmacy and type the correct one.  This is the patient's preferred pharmacy:  [facility] - [place], [place] - [address] [place] [address] Phone: [phone] Fax: [phone]   Has the prescription been filled recently? No - samples   Is the patient out of the medication? No - 12 left   Has the patient been seen for an appointment in the last year OR does the patient have an upcoming appointment? Yes  Can we respond through MyChart? No - call daughter   Agent: Please be advised that Rx refills may take up to 3 business days. We ask that you follow-up with your pharmacy.

## 2023-04-21 ENCOUNTER — Encounter: Payer: Self-pay | Admitting: Psychiatry

## 2023-04-26 ENCOUNTER — Inpatient Hospital Stay: Payer: PPO | Attending: Psychiatry | Admitting: Psychiatry

## 2023-04-26 ENCOUNTER — Ambulatory Visit: Payer: PPO

## 2023-04-26 ENCOUNTER — Encounter: Payer: Self-pay | Admitting: Psychiatry

## 2023-04-26 VITALS — BP 154/72 | HR 70 | Temp 98.5°F | Resp 20 | Wt 158.4 lb

## 2023-04-26 DIAGNOSIS — R1900 Intra-abdominal and pelvic swelling, mass and lump, unspecified site: Secondary | ICD-10-CM | POA: Insufficient documentation

## 2023-04-26 DIAGNOSIS — Z90710 Acquired absence of both cervix and uterus: Secondary | ICD-10-CM | POA: Insufficient documentation

## 2023-04-26 DIAGNOSIS — R1032 Left lower quadrant pain: Secondary | ICD-10-CM | POA: Diagnosis not present

## 2023-04-26 NOTE — Patient Instructions (Signed)
 It was a pleasure to see you in clinic today. - We discussed continued close observation - Return visit planned for 3-4 months with ct scan prior.   Thank you very much for allowing me to provide care for you today.  I appreciate your confidence in choosing our Gynecologic Oncology team at [facility].  If you have any questions about your visit today please call our office or send [place] a MyChart message and we will get back to you as soon as possible.

## 2023-04-26 NOTE — Progress Notes (Signed)
 Gynecologic Oncology Return Clinic Visit  Date of Service: 04/26/2023 Referring Provider: Harvie Bridge, MD   Assessment & Plan: Debra [name] is a 89 y.o. woman with a 14cm cystic pelvic mss, ORADS3 (overread at [facility]) with normal tumor markers who presents for follow-up.  Ovarian mass: - Overall stable mass from Sept 2024 to December 2024 - Unclear if the pain is solely from the cystic mass or from her diverticulosis and significant constipation.  Given significant improvement in stability and pain control since improved bowel regimen with Senokot and Linzess, suspect that her acute onset of symptoms previously may have been due to her constipation. - Reviewed pros/cons of continued close observation versus surgical resection. We did review potential limitations of surgery given her prior surgery in 2015 which did not identify the cystic collection at that time and whether this could be an ovarian mass versus a peritoneal inclusion cyst. - Other conservative management could include VIR drainage. Risk of spillage if a malignant process, but given that this cystic collection has been present for over 10 years without any other evidence of metastatic disease in the abdomen, I have low concern for malignant process but cannot be 100% certain. - At this time, pt is in agreement with close observation - Plan repeat CT in 3-4 months which will be about 23mo follow-up from prior scan.   RTC 3-65mo, CT scan prior  Clide Cliff, MD Gynecologic Oncology   Medical Decision Making I personally spent  TOTAL 30 minutes face-to-face and non-face-to-face in the care of this patient, which includes all pre, intra, and post visit time on the date of service.   ----------------------- Reason for Visit: Follow-up  Treatment History: Patient was seen as a new GYN visit on 09/22/2022.  She is being evaluated for follow-up from an ED visit on 07/29/2022 for left lower quadrant pain with also diagnosis  of diverticulitis.  During her workup, she was noted to have on CT abdomen/pelvis a left adnexal mass measuring 9.9 x 9.5 x 12.9 cm with stable calcifications and no solid components.  On review, this mass was noted to be present since at least 2008 but has increased in size from 3.9 cm in 2008 to 6.7 cm in 2015 to 14.5 cm currently.  In 2015 she underwent a laparotomy with Dr. Erin Fulling and was noted to have pelvic adhesions with fluid between the adhesions on the left side but no masses.  The left ovary was not visualized or palpable.   Patient subsequently underwent a MRI pelvis on 11/17/2022 which showed a 14.5 cm complex cystic mass centered in the left adnexa, O RADS 4 with additional signs of severe sigmoid diverticulosis.  A CA125 was collected on 12/09/2022 and returned as normal at 11.6.  HE4 was elevated at 114, but postmenopausal [name] was low risk.   She previously underwent an abdominal hysterectomy for heavy bleeding.  She believes at that time she had 1 ovary removed and the other ovary partially removed.  She believes her right ovary was removed and that she has part of her left ovary remaining.  This occurred in her mid 30s.   Patient follows with Dr. Yvette Rack in [place] for her glaucoma.  Repeat CT scan 02/25/23 performed in the ED in the setting of left sided pain, showed a persistent cystic collection measuring 13 x 12 x 10 cm.  She was also advised on her constipation and diverticulosis.  She took medications to "clear out her colon" which was successful and  improved her symptoms  Interval History: Presents with her daughter.  Reports that she is overall been doing well since her last visit with me.  She continues with Senokot and was started on Linzess by her provider.  She feels that this is working well.  She is having a bowel movement every day and feels that her bowels are overall under much better control.  Not having any more severe pain like what brought her to the  emergency department and most the time without any pain whatsoever.  Also feels that she is doing better with eating since her bowels have improved.    Past Medical/Surgical History: Past Medical History:  Diagnosis Date   Arthritis    Phreesia 07/02/2020   Brain mass    monitor by Dr Dutch Quint - right posterior fossa meningioma - thinks it's benign per patient   Cancer (HCC)    hand, face - spots removed   Constipation    Degeneration of lumbar or lumbosacral intervertebral disc    arthritis in back, hips and hands - otc med prn   Diarrhea    Diverticulosis of colon with hemorrhage    Dysrhythmia    Hx irregular heart beat - 15 yrs ago   Female stress incontinence    Glaucoma    Phreesia 07/02/2020   Headache    otc med prn   Hypertension    Lumbago    Missed abortion    x 2 - no surgery required   Paroxysmal supraventricular tachycardia (HCC)    Hx   PONV (postoperative nausea and vomiting)    Pure hypercholesterolemia    SVD (spontaneous vaginal delivery)    x 4   Unspecified glaucoma(365.9)    bilateral    Past Surgical History:  Procedure Laterality Date   ABDOMINAL HYSTERECTOMY     APPENDECTOMY     COLONOSCOPY     EYE SURGERY     bilateral cataract eye surgery   EYE SURGERY     laser to relieve eye pressure - bilateral   LAPAROTOMY N/A 02/06/2014   Procedure: EXPLORATORY LAPAROTOMY;  Surgeon: Willodean Rosenthal, MD;  Location: [facility];  Service: Gynecology;  Laterality: N/A;   MULTIPLE TOOTH EXTRACTIONS     upper   right hand surgery     ganglion cyst removed   TUBAL LIGATION      Family History  Problem Relation Age of Onset   Aneurysm Mother    Alzheimer's disease Mother    Prostate cancer Father    Cancer Sister        unknown kind, metastatic   Arthritis Sister    Prostate cancer Brother    Heart disease Brother    Hypertension Brother    Glaucoma Brother    Prostate cancer Brother    Diabetes Brother    Heart disease Brother    Glaucoma  Brother    Breast cancer Paternal Aunt    Colon cancer Neg Hx    Ovarian cancer Neg Hx    Endometrial cancer Neg Hx    Pancreatic cancer Neg Hx     Social History   Socioeconomic History   Marital status: Widowed    Spouse name: Not on file   Number of children: 4   Years of education: Not on file   Highest education level: Not on file  Occupational History   Occupation: raised tobacco    Employer: Retired  Tobacco Use   Smoking status: Never   Smokeless tobacco: Never  Vaping Use   Vaping status: Never Used  Substance and Sexual Activity   Alcohol use: No   Drug use: No   Sexual activity: Not Currently    Birth control/protection: None, Post-menopausal, Surgical  Other Topics Concern   Not on file  Social History Narrative    Has living will.  Daughter is HCPOA, Lenice Llamas.Full code.   3 sons and 1 daughter.   8 grandchildren   5 great grandchildren   Social Drivers of Corporate investment banker Strain: Low Risk  (05/14/2022)   Overall Financial Resource Strain (CARDIA)    Difficulty of Paying Living Expenses: Not hard at all  Food Insecurity: No Food Insecurity (05/14/2022)   Hunger Vital Sign    Worried About Running Out of Food in the Last Year: Never true    Ran Out of Food in the Last Year: Never true  Transportation Needs: No Transportation Needs (05/14/2022)   PRAPARE - Administrator, Civil Service (Medical): No    Lack of Transportation (Non-Medical): No  Physical Activity: Insufficiently Active (05/14/2022)   Exercise Vital Sign    Days of Exercise per Week: 3 days    Minutes of Exercise per Session: 30 min  Stress: No Stress Concern Present (05/14/2022)   [employer] of Occupational Health - Occupational Stress Questionnaire    Feeling of Stress : Not at all  Social Connections: Moderately Integrated (05/14/2022)   Social Connection and Isolation Panel [NHANES]    Frequency of Communication with Friends and Family: More than  three times a week    Frequency of Social Gatherings with Friends and Family: Three times a week    Attends Religious Services: More than 4 times per year    Active Member of Clubs or Organizations: Yes    Attends Banker Meetings: More than 4 times per year    Marital Status: Widowed    Current Medications:  Current Outpatient Medications:    acetaminophen (TYLENOL) 325 MG tablet, Take 650 mg by mouth every 6 (six) hours as needed., Disp: , Rfl:    amLODipine (NORVASC) 10 MG tablet, TAKE ONE TABLET BY MOUTH ONCE DAILY (PLEASE KEEP ANNUAL VISIT FOR FUTURE REFILLS), Disp: 90 tablet, Rfl: 0   atenolol (TENORMIN) 50 MG tablet, TAKE ONE TABLET BY MOUTH ONCE A DAY (PLEASE KEEP ANNUAL VISIT FOR FUTURE REFILLS), Disp: 90 tablet, Rfl: 3   brimonidine (ALPHAGAN) 0.2 % ophthalmic solution, Place 1 drop into both eyes 2 (two) times daily., Disp: , Rfl:    cholecalciferol (VITAMIN D3) 25 MCG (1000 UNIT) tablet, Take 1,000 Units by mouth daily., Disp: , Rfl:    cloNIDine (CATAPRES) 0.1 MG tablet, TAKE 1 TABLET BY MOUTH IN THE MORNING AND 2 TABLETS BY MOUTH AT BEDTIME (Patient taking differently: Take 0.1 mg by mouth 2 (two) times daily. Pt takes 2 tablets at night, none in the morning.), Disp: 270 tablet, Rfl: 3   latanoprost (XALATAN) 0.005 % ophthalmic solution, Place 1 drop into both eyes at bedtime., Disp: , Rfl:    linaclotide (LINZESS) 290 MCG CAPS capsule, Take 1 capsule (290 mcg total) by mouth daily before breakfast., Disp: 30 capsule, Rfl: 3   lisinopril (ZESTRIL) 40 MG tablet, TAKE ONE TABLET BY MOUTH DAILY, Disp: 90 tablet, Rfl: 3   polyethylene glycol powder (GLYCOLAX/MIRALAX) 17 GM/SCOOP powder, Take 1 Container by mouth as needed for moderate constipation., Disp: , Rfl:    pravastatin (PRAVACHOL) 80 MG tablet, TAKE ONE TABLET BY  MOUTH ONCE A DAY (PLEASE KEEP ANNUAL VISIT FOR FUTURE REFILLS), Disp: 90 tablet, Rfl: 0   Probiotic Product (PROBIOTIC DAILY PO), Take 15 mg by mouth  daily., Disp: , Rfl:    Psyllium (METAMUCIL 3 IN 1 DAILY FIBER PO), Take 2 g by mouth in the morning and at bedtime., Disp: , Rfl:    senna (SENOKOT) 8.6 MG tablet, Take 2 tablets by mouth daily., Disp: , Rfl:    timolol (TIMOPTIC) 0.5 % ophthalmic solution, Place 1 drop into both eyes 2 (two) times daily., Disp: , Rfl:    traMADol (ULTRAM) 50 MG tablet, Take 50 mg by mouth every 6 (six) hours as needed., Disp: , Rfl:   Review of Symptoms: Complete 10-system review is positive for: Hearing loss, constipation  Physical Exam: BP (!) 166/71 (BP Location: Left Arm, Patient Position: Sitting) Comment: Notified RN  Pulse 70   Temp 98.5 F (36.9 C) (Oral)   Resp 20   Wt 158 lb 6.4 oz (71.8 kg)   SpO2 93%   BMI 28.06 kg/m  General: Alert, oriented, no acute distress. HEENT: Normocephalic, atraumatic. Neck symmetric without masses.  Chest: Normal work of breathing. Clear to auscultation bilaterally.   Cardiovascular: Regular rate and rhythm, no murmurs. Abdomen: Soft, non tender to palpation.  Normoactive bowel sounds.  No masses appreciated.  Well-healed incisions.  Extremities: Grossly normal range of motion.  Warm, well perfused.  No edema bilaterally. Skin: No rashes or lesions noted. Lymphatic: No cervical, supraclavicular, or inguinal adenopathy. Genitourinary: External genitalia without lesions. Urethral meatus without lesions or prolapse. On speculum exam, vagina without lesions. Bimanual exam reveals normal cuff. No discrete mass palpated, but some fullness potentially appreciate just proximal from vaginal cuff, difficult to reach. Exam chaperoned by Kimberly [place], CMA   Laboratory & Radiologic Studies: Lab Results  Component Value Date   ZOX096 12.9 03/01/2023   CAN125 11.6 12/09/2022   CAN125 11.3 09/22/2022

## 2023-06-01 ENCOUNTER — Other Ambulatory Visit: Payer: Self-pay | Admitting: Family Medicine

## 2023-06-03 NOTE — Telephone Encounter (Signed)
 Requested Prescriptions  Pending Prescriptions Disp Refills   amLODipine (NORVASC) 10 MG tablet [Pharmacy Med Name: AMLODIPINE BESYLATE 10MG  TABLET] 90 tablet 0    Sig: TAKE ONE TABLET BY MOUTH ONCE DAILY (PLEASE KEEP ANNUAL VISIT FOR FUTURE REFILLS)     Cardiovascular: Calcium Channel Blockers 2 Failed - 06/03/2023  9:28 AM      Failed - Last BP in normal range    BP Readings from Last 1 Encounters:  04/26/23 (!) 154/72         Passed - Last Heart Rate in normal range    Pulse Readings from Last 1 Encounters:  04/26/23 70         Passed - Valid encounter within last 6 months    Recent Outpatient Visits           3 months ago Chronic idiopathic constipation   [facility] Donita Brooks, MD   5 months ago Fecal soiling due to fecal incontinence   [place] [facility] Donita Brooks, MD   7 months ago Diarrhea of infectious origin   [place] [facility] Pickard, Priscille Heidelberg, MD   8 months ago Essential hypertension, benign   [place] [facility] Pickard, Priscille Heidelberg, MD   8 months ago Essential hypertension, benign   [place] [facility] Pickard, Priscille Heidelberg, MD

## 2023-06-14 ENCOUNTER — Other Ambulatory Visit: Payer: Self-pay | Admitting: Family Medicine

## 2023-06-15 NOTE — Telephone Encounter (Signed)
 Requested Prescriptions  Pending Prescriptions Disp Refills   cloNIDine (CATAPRES) 0.1 MG tablet [Pharmacy Med Name: CLONIDINE HYDROCHLORIDE 0.1MG  TABLET] 270 tablet 0    Sig: TAKE ONE TABLET BY MOUTH IN THE MORNING AND TWO TABLETS BY MOUTH AT BEDTIME     Cardiovascular:  Alpha-2 Agonists Failed - 06/15/2023  3:52 PM      Failed - Last BP in normal range    BP Readings from Last 1 Encounters:  04/26/23 (!) 154/72         Passed - Last Heart Rate in normal range    Pulse Readings from Last 1 Encounters:  04/26/23 70         Passed - Valid encounter within last 6 months    Recent Outpatient Visits           3 months ago Chronic idiopathic constipation   [place] [facility] Austine Lefort, MD   5 months ago Fecal soiling due to fecal incontinence   [place] [facility] Austine Lefort, MD   8 months ago Diarrhea of infectious origin   [place] [facility] Pickard, Cisco Crest, MD   8 months ago Essential hypertension, benign   [place] [facility] Pickard, Cisco Crest, MD   9 months ago Essential hypertension, benign   [place] [facility] Pickard, Cisco Crest, MD

## 2023-06-28 DIAGNOSIS — H40153 Residual stage of open-angle glaucoma, bilateral: Secondary | ICD-10-CM | POA: Diagnosis not present

## 2023-07-20 ENCOUNTER — Other Ambulatory Visit: Payer: Self-pay | Admitting: Family Medicine

## 2023-07-21 ENCOUNTER — Ambulatory Visit
Admission: RE | Admit: 2023-07-21 | Discharge: 2023-07-21 | Disposition: A | Discharge status: Home / Self Care | Payer: PPO | Source: Ambulatory Visit | Attending: Gynecologic Oncology | Admitting: Gynecologic Oncology

## 2023-07-21 DIAGNOSIS — N858 Other specified noninflammatory disorders of uterus: Secondary | ICD-10-CM | POA: Diagnosis not present

## 2023-07-21 DIAGNOSIS — R102 Pelvic and perineal pain: Secondary | ICD-10-CM | POA: Diagnosis not present

## 2023-07-21 DIAGNOSIS — R1900 Intra-abdominal and pelvic swelling, mass and lump, unspecified site: Secondary | ICD-10-CM

## 2023-07-21 MED ORDER — IOPAMIDOL (ISOVUE-300) INJECTION 61%
100.0000 mL | Freq: Once | INTRAVENOUS | Status: AC | PRN
  Administered 2023-07-21: 100 mL via INTRAVENOUS

## 2023-07-22 NOTE — Telephone Encounter (Signed)
 Requested Prescriptions  Pending Prescriptions Disp Refills   atenolol  (TENORMIN ) 50 MG tablet [Pharmacy Med Name: ATENOLOL  50MG  TABLET] 90 tablet 0    Sig: TAKE ONE TABLET BY MOUTH ONCE A DAY (PLEASE KEEP ANNUAL VISIT FOR FUTURE REFILLS)     Cardiovascular: Beta Blockers 2 Failed - 07/22/2023  8:27 AM      Failed - Last BP in normal range    BP Readings from Last 1 Encounters:  04/26/23 (!) 154/72         Failed - Valid encounter within last 6 months    Recent Outpatient Visits           4 months ago Chronic idiopathic constipation   [place] [facility] Austine Lefort, MD   6 months ago Fecal soiling due to fecal incontinence   [place] [facility] Austine Lefort, MD   9 months ago Diarrhea of infectious origin   [place] [facility] Pickard, Cisco Crest, MD   9 months ago Essential hypertension, benign   [place] [facility] Cheril Cork, Cisco Crest, MD   10 months ago Essential hypertension, benign   [place] [facility] Pickard, Cisco Crest, MD              Passed - Cr in normal range and within 360 days    Creatinine  Date Value Ref Range Status  03/10/2016 0.8 0.6 - 1.1 mg/dL Final   Creat  Date Value Ref Range Status  07/16/2022 0.81 0.60 - 0.95 mg/dL Final   Creatinine, Ser  Date Value Ref Range Status  02/25/2023 0.80 0.44 - 1.00 mg/dL Final         Passed - Last Heart Rate in normal range    Pulse Readings from Last 1 Encounters:  04/26/23 70

## 2023-07-27 ENCOUNTER — Ambulatory Visit: Payer: Self-pay | Admitting: Psychiatry

## 2023-07-28 ENCOUNTER — Encounter: Payer: Self-pay | Admitting: Psychiatry

## 2023-08-02 ENCOUNTER — Inpatient Hospital Stay: Payer: PPO | Attending: Psychiatry | Admitting: Psychiatry

## 2023-08-02 ENCOUNTER — Encounter: Payer: Self-pay | Admitting: Psychiatry

## 2023-08-02 VITALS — BP 144/68 | HR 68 | Temp 97.8°F | Resp 17 | Ht 63.0 in | Wt 158.2 lb

## 2023-08-02 DIAGNOSIS — R1900 Intra-abdominal and pelvic swelling, mass and lump, unspecified site: Secondary | ICD-10-CM | POA: Diagnosis not present

## 2023-08-02 NOTE — Patient Instructions (Signed)
 It was a pleasure to see you in clinic today. - Stable exam - Return visit planned for 6 months  Thank you very much for allowing me to provide care for you today.  I appreciate your confidence in choosing our Gynecologic Oncology team at [facility].  If you have any questions about your visit today please call our office or send us  a MyChart message and we will get back to you as soon as possible.

## 2023-08-02 NOTE — Progress Notes (Signed)
 Gynecologic Oncology Return Clinic Visit  Date of Service: 08/02/2023 Referring Provider: Loralyn Rochester, MD   Assessment & Plan: Debra [name] is a 89 y.o. woman with a 14cm cystic pelvic mss, ORADS3 (overread at Debra [name]) with normal tumor markers who presents for follow-up.  Ovarian mass: - Overall stable mass from Sept 2024 to December 2024 - Most recent CT 07/21/23 with slight interval enlargement (11.0 x 9.5cm to 11.7x9.6cm).  - Pt asymptomatic. - Reviewed pros/cons of continued close observation versus surgical resection. Previously reviewed potential limitations of surgery given her prior surgery in 2015 which did not identify the cystic collection at that time and whether this could be an ovarian mass versus a peritoneal inclusion cyst. - Discussed close observation with follow-up q48mo and repeat CT in 1 year. Pt wishes to defer additional imaging unless new symptoms. Feel that this is very reasonable as pt would like to avoid interventions in general. - Given that this cystic collection has been present for over 10 years without any other evidence of metastatic disease in the abdomen, I have low concern for malignant process but cannot be 100% certain. - Signs/symptoms of concern reviewed. - Follow-up in 61mo. Repeat CT scan if change in symptoms, exam of concern.    RTC 61mo  Debra Flies, MD Gynecologic Oncology   Medical Decision Making I personally spent  TOTAL 25 minutes face-to-face and non-face-to-face in the care of this patient, which includes all pre, intra, and post visit time on the date of service.   ----------------------- Reason for Visit: Follow-up  Treatment History: Patient was seen as a new GYN visit on 09/22/2022.  She is being evaluated for follow-up from an ED visit on 07/29/2022 for left lower quadrant pain with also diagnosis of diverticulitis.  During her workup, she was noted to have on CT abdomen/pelvis a left adnexal mass measuring 9.9 x 9.5 x  12.9 cm with stable calcifications and no solid components.  On review, this mass was noted to be present since at least 2008 but has increased in size from 3.9 cm in 2008 to 6.7 cm in 2015 to 14.5 cm currently.  In 2015 she underwent a laparotomy with Dr. Dearl [name] and was noted to have pelvic adhesions with fluid between the adhesions on the left side but no masses.  The left ovary was not visualized or palpable.   Patient subsequently underwent a MRI pelvis on 11/17/2022 which showed a 14.5 cm complex cystic mass centered in the left adnexa, O RADS 4 with additional signs of severe sigmoid diverticulosis.  A CA125 was collected on 12/09/2022 and returned as normal at 11.6.  HE4 was elevated at 114, but postmenopausal Debra [name] was low risk.   She previously underwent an abdominal hysterectomy for heavy bleeding.  She believes at that time she had 1 ovary removed and the other ovary partially removed.  She believes her right ovary was removed and that she has part of her left ovary remaining.  This occurred in her mid 30s.   Patient follows with Dr. Nelta [name] in [place] for her glaucoma.  Repeat CT scan 02/25/23 performed in the ED in the setting of left sided pain, showed a persistent cystic collection measuring 13 x 12 x 10 cm.  She was also advised on her constipation and diverticulosis.  She took medications to "clear out her colon" which was successful and improved her symptoms  Interval History: Patient presents with her daughter today.  Feels that she is overall doing well.  Reports that the Linzess  prescribed per her PCP is working well for her.  Not having any abdominal pain.  Takes occasional Tylenol  for leg/arm pain but not for abdominal pain.  Appetite stable.  Working on Dealer work.     Past Medical/Surgical History: Past Medical History:  Diagnosis Date   Arthritis    Phreesia 07/02/2020   Brain mass    monitor by Dr Gwendlyn Lemmings - right posterior fossa meningioma - thinks it's  benign per patient   Cancer (HCC)    hand, face - spots removed   Constipation    Degeneration of lumbar or lumbosacral intervertebral disc    arthritis in back, hips and hands - otc med prn   Diarrhea    Diverticulosis of colon with hemorrhage    Dysrhythmia    Hx irregular heart beat - 15 yrs ago   Female stress incontinence    Glaucoma    Phreesia 07/02/2020   Headache    otc med prn   Hypertension    Lumbago    Missed abortion    x 2 - no surgery required   Paroxysmal supraventricular tachycardia (HCC)    Hx   PONV (postoperative nausea and vomiting)    Pure hypercholesterolemia    SVD (spontaneous vaginal delivery)    x 4   Unspecified glaucoma(365.9)    bilateral    Past Surgical History:  Procedure Laterality Date   ABDOMINAL HYSTERECTOMY     APPENDECTOMY     COLONOSCOPY     EYE SURGERY     bilateral cataract eye surgery   EYE SURGERY     laser to relieve eye pressure - bilateral   LAPAROTOMY N/A 02/06/2014   Procedure: EXPLORATORY LAPAROTOMY;  Surgeon: Lenord Radon, MD;  Location: [facility];  Service: Gynecology;  Laterality: N/A;   MULTIPLE TOOTH EXTRACTIONS     upper   right hand surgery     ganglion cyst removed   TUBAL LIGATION      Family History  Problem Relation Age of Onset   Aneurysm Mother    Alzheimer's disease Mother    Prostate cancer Father    Cancer Sister        unknown kind, metastatic   Arthritis Sister    Prostate cancer Brother    Heart disease Brother    Hypertension Brother    Glaucoma Brother    Prostate cancer Brother    Diabetes Brother    Heart disease Brother    Glaucoma Brother    Breast cancer Paternal Aunt    Colon cancer Neg Hx    Ovarian cancer Neg Hx    Endometrial cancer Neg Hx    Pancreatic cancer Neg Hx     Social History   Socioeconomic History   Marital status: Widowed    Spouse name: Not on file   Number of children: 4   Years of education: Not on file   Highest education level: Not on  file  Occupational History   Occupation: raised tobacco    Employer: Retired  Tobacco Use   Smoking status: Never   Smokeless tobacco: Never  Vaping Use   Vaping status: Never Used  Substance and Sexual Activity   Alcohol use: No   Drug use: No   Sexual activity: Not Currently    Birth control/protection: None, Post-menopausal, Surgical  Other Topics Concern   Not on file  Social History Narrative    Has living will.  Daughter is HCPOA, Glynda Lash.Full code.  3 sons and 1 daughter.   8 grandchildren   5 great grandchildren   Social Drivers of Corporate investment banker Strain: Low Risk  (05/14/2022)   Overall Financial Resource Strain (CARDIA)    Difficulty of Paying Living Expenses: Not hard at all  Food Insecurity: No Food Insecurity (05/14/2022)   Hunger Vital Sign    Worried About Running Out of Food in the Last Year: Never true    Ran Out of Food in the Last Year: Never true  Transportation Needs: No Transportation Needs (05/14/2022)   PRAPARE - Administrator, Civil Service (Medical): No    Lack of Transportation (Non-Medical): No  Physical Activity: Insufficiently Active (05/14/2022)   Exercise Vital Sign    Days of Exercise per Week: 3 days    Minutes of Exercise per Session: 30 min  Stress: No Stress Concern Present (05/14/2022)   [employer] of Occupational Health - Occupational Stress Questionnaire    Feeling of Stress : Not at all  Social Connections: Moderately Integrated (05/14/2022)   Social Connection and Isolation Panel [NHANES]    Frequency of Communication with Friends and Family: More than three times a week    Frequency of Social Gatherings with Friends and Family: Three times a week    Attends Religious Services: More than 4 times per year    Active Member of Clubs or Organizations: Yes    Attends Banker Meetings: More than 4 times per year    Marital Status: Widowed    Current Medications:  Current Outpatient  Medications:    acetaminophen  (TYLENOL ) 325 MG tablet, Take 650 mg by mouth every 6 (six) hours as needed., Disp: , Rfl:    amLODipine  (NORVASC ) 10 MG tablet, Take 1 tablet (10 mg total) by mouth daily., Disp: 90 tablet, Rfl: 0   atenolol  (TENORMIN ) 50 MG tablet, TAKE ONE TABLET BY MOUTH ONCE A DAY (PLEASE KEEP ANNUAL VISIT FOR FUTURE REFILLS), Disp: 90 tablet, Rfl: 0   brimonidine  (ALPHAGAN ) 0.2 % ophthalmic solution, Place 1 drop into both eyes 2 (two) times daily., Disp: , Rfl:    cholecalciferol (VITAMIN D3) 25 MCG (1000 UNIT) tablet, Take 1,000 Units by mouth daily., Disp: , Rfl:    cloNIDine  (CATAPRES ) 0.1 MG tablet, TAKE ONE TABLET BY MOUTH IN THE MORNING AND TWO TABLETS BY MOUTH AT BEDTIME (Patient taking differently: TAKE ONE TABLET BY MOUTH IN THE MORNING 1 TABLETS BY MOUTH AT BEDTIME), Disp: 270 tablet, Rfl: 0   latanoprost  (XALATAN ) 0.005 % ophthalmic solution, Place 1 drop into both eyes at bedtime., Disp: , Rfl:    linaclotide  (LINZESS ) 290 MCG CAPS capsule, Take 1 capsule (290 mcg total) by mouth daily before breakfast., Disp: 30 capsule, Rfl: 3   lisinopril  (ZESTRIL ) 40 MG tablet, TAKE ONE TABLET BY MOUTH DAILY, Disp: 90 tablet, Rfl: 3   pravastatin  (PRAVACHOL ) 80 MG tablet, TAKE ONE TABLET BY MOUTH ONCE A DAY (PLEASE KEEP ANNUAL VISIT FOR FUTURE REFILLS), Disp: 90 tablet, Rfl: 0   senna (SENOKOT) 8.6 MG tablet, Take 2 tablets by mouth daily., Disp: , Rfl:    timolol  (TIMOPTIC ) 0.5 % ophthalmic solution, Place 1 drop into both eyes 2 (two) times daily., Disp: , Rfl:    traMADol  (ULTRAM ) 50 MG tablet, Take 50 mg by mouth every 6 (six) hours as needed., Disp: , Rfl:   Review of Symptoms: Complete 10-system review is positive for: Hearing loss, constipation  Physical Exam: BP (!) 152/67 (BP Location:  Left Arm, Patient Position: Sitting) Comment: CMA notified  Pulse 68   Temp 97.8 F (36.6 C) (Oral)   Resp 17   Ht 5\' 3"  (1.6 m)   Wt 158 lb 3.2 oz (71.8 kg)   SpO2 96%   BMI  28.02 kg/m  General: Alert, oriented, no acute distress. HEENT: Normocephalic, atraumatic. Neck symmetric without masses.  Chest: Normal work of breathing. Clear to auscultation bilaterally.   Cardiovascular: Regular rate and rhythm, no murmurs. Abdomen: Soft, non tender to palpation.  Normoactive bowel sounds.  Mobile mass palpated just above pubic symphysis.  Well-healed incisions.  Extremities: Grossly normal range of motion.  Warm, well perfused.  No edema bilaterally. Skin: No rashes or lesions noted. Lymphatic: No cervical, supraclavicular, or inguinal adenopathy. Genitourinary: External genitalia without lesions. Urethral meatus without lesions or prolapse. On speculum exam, vagina without lesions. Bimanual exam reveals normal cuff. Mass somewhat appreciated just proximal from vaginal cuff, difficult to reach. Exam chaperoned by Kimberly [place], CMA    Laboratory & Radiologic Studies: Lab Results  Component Value Date   CAN125 12.9 03/01/2023   CAN125 11.6 12/09/2022   CAN125 11.3 09/22/2022   CT ABDOMEN PELVIS W CONTRAST 07/21/2023  Narrative EXAMINATION: CT ABDOMEN PELVIS W CONTRAST  CLINICAL INDICATION: Female, 89 years old. Pelvic pain, acute, post-menopausal; per Dr. Daisey Dryer, follow up on complex ovarian mass, pelvic pain  TECHNIQUE: Axial CT of the abdomen and pelvis with 80 mL Isovue -370 intravenous contrast. Multiplanar reformations provided. Unless otherwise specified, incidental thyroid , adrenal, renal lesions do not require dedicated imaging follow up. Additionally, any mentioned pulmonary nodules do not require dedicated imaging follow-up based on the Fleischner guidelines unless otherwise specified. Coronary calcifications are not identified unless otherwise specified.  COMPARISON: 02/25/2023  FINDINGS:  There are minimal atelectatic changes within the lung bases. The heart is normal in size. There are coronary calcifications. Small hiatal hernia.  The liver contains scattered cysts. There is cholelithiasis. The spleen appears normal. The pancreas is normal. The adrenals are normal. The kidneys are normal. The abdominal aorta is normal in caliber. Scattered atherosclerotic changes are present. Redemonstration of a large cystic structure within the left adnexal region measuring 11.7 x 9.6 cm (previously measuring 11.0 x 9.5 cm).  There is colonic diverticulosis. The appendix is not definitively identified, however, there are no inflammatory changes in the region of the cecum. Large and small bowel loops are otherwise within normal limits. No free fluid or pathologic lymphadenopathy by size criteria. There is diffuse osseous demineralization. There are degenerative changes of the spine and bony pelvis.  IMPRESSION:  No acute findings in the abdomen or pelvis.  Slight interval growth of the large left adnexal cystic lesion. Gynecologic oncology follow-up recommended.  DOSE REDUCTION: This exam was performed according to our departmental dose-optimization program which includes automated exposure control, adjustment of the mA and/or kV according to patient size and/or use of iterative reconstruction technique.  Electronically signed by: [place] Engel MD 07/21/2023 02:15 PM EDT RP Workstation: [id number]

## 2023-08-11 ENCOUNTER — Emergency Department (HOSPITAL_BASED_OUTPATIENT_CLINIC_OR_DEPARTMENT_OTHER): Admission: EM | Admit: 2023-08-11 | Discharge: 2023-08-11 | Disposition: A | Discharge status: Home / Self Care

## 2023-08-11 ENCOUNTER — Encounter (HOSPITAL_BASED_OUTPATIENT_CLINIC_OR_DEPARTMENT_OTHER): Payer: Self-pay

## 2023-08-11 ENCOUNTER — Other Ambulatory Visit: Payer: Self-pay

## 2023-08-11 ENCOUNTER — Emergency Department (HOSPITAL_BASED_OUTPATIENT_CLINIC_OR_DEPARTMENT_OTHER)

## 2023-08-11 DIAGNOSIS — I10 Essential (primary) hypertension: Secondary | ICD-10-CM | POA: Insufficient documentation

## 2023-08-11 DIAGNOSIS — Z91040 Latex allergy status: Secondary | ICD-10-CM | POA: Diagnosis not present

## 2023-08-11 DIAGNOSIS — R1032 Left lower quadrant pain: Secondary | ICD-10-CM | POA: Insufficient documentation

## 2023-08-11 DIAGNOSIS — Z859 Personal history of malignant neoplasm, unspecified: Secondary | ICD-10-CM | POA: Diagnosis not present

## 2023-08-11 DIAGNOSIS — R932 Abnormal findings on diagnostic imaging of liver and biliary tract: Secondary | ICD-10-CM | POA: Diagnosis not present

## 2023-08-11 DIAGNOSIS — K8020 Calculus of gallbladder without cholecystitis without obstruction: Secondary | ICD-10-CM | POA: Diagnosis not present

## 2023-08-11 DIAGNOSIS — D72819 Decreased white blood cell count, unspecified: Secondary | ICD-10-CM | POA: Diagnosis not present

## 2023-08-11 DIAGNOSIS — K5730 Diverticulosis of large intestine without perforation or abscess without bleeding: Secondary | ICD-10-CM | POA: Diagnosis not present

## 2023-08-11 DIAGNOSIS — Z79899 Other long term (current) drug therapy: Secondary | ICD-10-CM | POA: Insufficient documentation

## 2023-08-11 LAB — URINALYSIS, ROUTINE W REFLEX MICROSCOPIC
Bilirubin Urine: NEGATIVE
Glucose, UA: NEGATIVE mg/dL
Hgb urine dipstick: NEGATIVE
Ketones, ur: NEGATIVE mg/dL
Leukocytes,Ua: NEGATIVE
Nitrite: NEGATIVE
Protein, ur: NEGATIVE mg/dL
Specific Gravity, Urine: 1.005 (ref 1.005–1.030)
pH: 6.5 (ref 5.0–8.0)

## 2023-08-11 LAB — CBC WITH DIFFERENTIAL/PLATELET
Abs Immature Granulocytes: 0.01 10*3/uL (ref 0.00–0.07)
Basophils Absolute: 0 10*3/uL (ref 0.0–0.1)
Basophils Relative: 1 %
Eosinophils Absolute: 0.1 10*3/uL (ref 0.0–0.5)
Eosinophils Relative: 1 %
HCT: 42.1 % (ref 36.0–46.0)
Hemoglobin: 14 g/dL (ref 12.0–15.0)
Immature Granulocytes: 0 %
Lymphocytes Relative: 20 %
Lymphs Abs: 0.7 10*3/uL (ref 0.7–4.0)
MCH: 29.3 pg (ref 26.0–34.0)
MCHC: 33.3 g/dL (ref 30.0–36.0)
MCV: 88.1 fL (ref 80.0–100.0)
Monocytes Absolute: 0.2 10*3/uL (ref 0.1–1.0)
Monocytes Relative: 6 %
Neutro Abs: 2.7 10*3/uL (ref 1.7–7.7)
Neutrophils Relative %: 72 %
Platelets: 140 10*3/uL — ABNORMAL LOW (ref 150–400)
RBC: 4.78 MIL/uL (ref 3.87–5.11)
RDW: 13 % (ref 11.5–15.5)
WBC: 3.7 10*3/uL — ABNORMAL LOW (ref 4.0–10.5)
nRBC: 0 % (ref 0.0–0.2)

## 2023-08-11 LAB — COMPREHENSIVE METABOLIC PANEL WITH GFR
ALT: 9 U/L (ref 0–44)
AST: 18 U/L (ref 15–41)
Albumin: 4.1 g/dL (ref 3.5–5.0)
Alkaline Phosphatase: 82 U/L (ref 38–126)
Anion gap: 13 (ref 5–15)
BUN: 14 mg/dL (ref 8–23)
CO2: 23 mmol/L (ref 22–32)
Calcium: 10 mg/dL (ref 8.9–10.3)
Chloride: 102 mmol/L (ref 98–111)
Creatinine, Ser: 0.86 mg/dL (ref 0.44–1.00)
GFR, Estimated: >60 mL/min (ref >60)
Glucose, Bld: 91 mg/dL (ref 70–99)
Potassium: 4.1 mmol/L (ref 3.5–5.1)
Sodium: 139 mmol/L (ref 135–145)
Total Bilirubin: 0.7 mg/dL (ref 0.0–1.2)
Total Protein: 6.9 g/dL (ref 6.5–8.1)

## 2023-08-11 LAB — LIPASE, BLOOD: Lipase: 36 U/L (ref 11–51)

## 2023-08-11 MED ORDER — ONDANSETRON HCL 4 MG/2ML IJ SOLN
4.0000 mg | Freq: Once | INTRAMUSCULAR | Status: AC
  Administered 2023-08-11: 4 mg via INTRAVENOUS
  Filled 2023-08-11: qty 2

## 2023-08-11 MED ORDER — IOHEXOL 300 MG/ML  SOLN
100.0000 mL | Freq: Once | INTRAMUSCULAR | Status: AC | PRN
  Administered 2023-08-11: 85 mL via INTRAVENOUS

## 2023-08-11 NOTE — ED Provider Notes (Signed)
 [place] EMERGENCY DEPARTMENT AT [facility] Provider Note   CSN: [id number] Arrival date & time: 08/11/23  7829     History  Chief Complaint  Patient presents with   Abdominal Pain    Debra [name] is a 89 y.o. female.   Abdominal Pain Associated symptoms: nausea   Patient is an 89 year old female presents ED today with complaints of left lower quadrant pain that began suddenly 2 days ago when she was sitting up, reporting a popping or tearing pain to her left lower abdomen.  Notes that the day after reported significant pain that radiated to her left flank.  However today notes that she is feeling significantly better.  Reports that this is also accompanied with some mild nausea.  Previous medical history of ovarian mass, diverticulosis, paroxysmal SVT, status post hysterectomy/appendectomy.  Denies headache, vision changes, chest pain, shortness of breath, vomiting, diarrhea, melena, hematochezia, dysuria, vaginal bleeding, vaginal discharge, lower leg swelling.     Home Medications Prior to Admission medications   Medication Sig Start Date End Date Taking? Authorizing Provider  acetaminophen  (TYLENOL ) 325 MG tablet Take 650 mg by mouth every 6 (six) hours as needed.    [provider]  amLODipine  (NORVASC ) 10 MG tablet Take 1 tablet (10 mg total) by mouth daily. 06/03/23   Austine Lefort, MD  atenolol  (TENORMIN ) 50 MG tablet TAKE ONE TABLET BY MOUTH ONCE A DAY (PLEASE KEEP ANNUAL VISIT FOR FUTURE REFILLS) 07/22/23   Austine Lefort, MD  brimonidine  (ALPHAGAN ) 0.2 % ophthalmic solution Place 1 drop into both eyes 2 (two) times daily. 02/17/11   [provider]  cholecalciferol (VITAMIN D3) 25 MCG (1000 UNIT) tablet Take 1,000 Units by mouth daily.    [provider]  cloNIDine  (CATAPRES ) 0.1 MG tablet TAKE ONE TABLET BY MOUTH IN THE MORNING AND TWO TABLETS BY MOUTH AT BEDTIME Patient taking differently: TAKE ONE TABLET BY MOUTH IN  THE MORNING 1 TABLETS BY MOUTH AT BEDTIME 06/15/23   Austine Lefort, MD  latanoprost  (XALATAN ) 0.005 % ophthalmic solution Place 1 drop into both eyes at bedtime. 02/17/11   [provider]  linaclotide  (LINZESS ) 290 MCG CAPS capsule Take 1 capsule (290 mcg total) by mouth daily before breakfast. 03/16/23   Austine Lefort, MD  lisinopril  (ZESTRIL ) 40 MG tablet TAKE ONE TABLET BY MOUTH DAILY 03/05/23   Austine Lefort, MD  pravastatin  (PRAVACHOL ) 80 MG tablet TAKE ONE TABLET BY MOUTH ONCE A DAY (PLEASE KEEP ANNUAL VISIT FOR FUTURE REFILLS) 08/07/22   Austine Lefort, MD  senna (SENOKOT) 8.6 MG tablet Take 2 tablets by mouth daily.    [provider]  timolol  (TIMOPTIC ) 0.5 % ophthalmic solution Place 1 drop into both eyes 2 (two) times daily. 06/30/12   [provider]  traMADol  (ULTRAM ) 50 MG tablet Take 50 mg by mouth every 6 (six) hours as needed.    [provider]      Allergies    Codeine phosphate, Lipitor [atorvastatin ], Pneumococcal vaccines, Sulfa antibiotics, and Latex    Review of Systems   Review of Systems  Gastrointestinal:  Positive for abdominal pain and nausea.  All other systems reviewed and are negative.   Physical Exam Updated Vital Signs BP (!) 164/58   Pulse 66   Temp 98.5 F (36.9 C) (Oral)   Resp 18   Ht 5' 3 (1.6 m)   Wt 71.8 kg   SpO2 98%   BMI 28.02 kg/m  Physical Exam Vitals and nursing note reviewed.  Constitutional:      General: She is not in acute distress.    Appearance: Normal appearance. She is not ill-appearing.  HENT:     Head: Normocephalic and atraumatic.  Eyes:     General: No scleral icterus.    Extraocular Movements: Extraocular movements intact.     Conjunctiva/sclera: Conjunctivae normal.  Cardiovascular:     Rate and Rhythm: Normal rate and regular rhythm.     Pulses: Normal pulses.     Heart sounds: Normal heart sounds. No murmur heard.    No friction rub. No gallop.  Pulmonary:      Effort: Pulmonary effort is normal. No respiratory distress.     Breath sounds: Normal breath sounds. No stridor. No wheezing, rhonchi or rales.  Chest:     Chest wall: No tenderness.  Abdominal:     General: Abdomen is flat. Bowel sounds are normal. There is no distension or abdominal bruit. There are no signs of injury.     Palpations: Abdomen is soft. There is no shifting dullness.     Tenderness: There is abdominal tenderness in the left lower quadrant. There is no right CVA tenderness, left CVA tenderness, guarding or rebound. Negative signs include Murphy's sign and McBurney's sign.     Hernia: No hernia is present.  Skin:    General: Skin is warm and dry.     Coloration: Skin is not jaundiced, mottled or pale.     Findings: No erythema.  Neurological:     General: No focal deficit present.     Mental Status: She is alert and oriented to person, place, and time. Mental status is at baseline.     Cranial Nerves: No cranial nerve deficit.  Psychiatric:        Mood and Affect: Mood normal. Mood is not anxious.     ED Results / Procedures / Treatments   Labs (all labs ordered are listed, but only abnormal results are displayed) Labs Reviewed  CBC WITH DIFFERENTIAL/PLATELET - Abnormal; Notable for the following components:      Result Value   WBC 3.7 (*)    Platelets 140 (*)    All other components within normal limits  URINALYSIS, ROUTINE W REFLEX MICROSCOPIC - Abnormal; Notable for the following components:   Color, Urine COLORLESS (*)    All other components within normal limits  COMPREHENSIVE METABOLIC PANEL WITH GFR  LIPASE, BLOOD    EKG None  Radiology CT ABDOMEN PELVIS W CONTRAST Result Date: 08/11/2023 CLINICAL DATA:  Acute left lower quadrant abdominal pain. EXAM: CT ABDOMEN AND PELVIS WITH CONTRAST TECHNIQUE: Multidetector CT imaging of the abdomen and pelvis was performed using the standard protocol following bolus administration of intravenous contrast.  RADIATION DOSE REDUCTION: This exam was performed according to the departmental dose-optimization program which includes automated exposure control, adjustment of the mA and/or kV according to patient size and/or use of iterative reconstruction technique. CONTRAST:  85mL OMNIPAQUE  IOHEXOL  300 MG/ML  SOLN COMPARISON:  Jul 21, 2023.  November 17, 2022. FINDINGS: Lower chest: No acute abnormality. Hepatobiliary: Large solitary gallstone is again noted. No biliary dilatation. Stable probable hepatic cysts. Pancreas: Unremarkable. No pancreatic ductal dilatation or surrounding inflammatory changes. Spleen: Normal in size without focal abnormality. Adrenals/Urinary Tract: Adrenal glands are unremarkable. Kidneys are normal, without renal calculi, focal lesion, or hydronephrosis. Bladder is unremarkable. Stomach/Bowel: The stomach is unremarkable. There is no evidence of bowel obstruction or inflammation. Extensive sigmoid diverticulosis  is noted. Appendix is not clearly visualized. Vascular/Lymphatic: Aortic atherosclerosis. No enlarged abdominal or pelvic lymph nodes. Reproductive: Patient appears to be status post hysterectomy. 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 Other: No ascites or hernia is noted. Musculoskeletal: No acute or significant osseous findings. 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 Consultation with gynecology is recommended. Large solitary gallstone. Extensive sigmoid diverticulosis without definite evidence of inflammation. Aortic Atherosclerosis (ICD10-I70.0). Electronically Signed   By: Rosalene Colon M.D.   On: 08/11/2023 12:17    Procedures Procedures    Medications Ordered in ED Medications  ondansetron  (ZOFRAN ) injection 4 mg (4 mg Intravenous Given 08/11/23 1058)   iohexol  (OMNIPAQUE ) 300 MG/ML solution 100 mL (85 mLs Intravenous Contrast Given 08/11/23 1152)    ED Course/ Medical Decision Making/ A&P                                 Medical Decision Making Amount and/or Complexity of Data Reviewed Labs: ordered. Radiology: ordered.  Risk Prescription drug management.   This patient is a 89 year old female who presents to the ED for concern of left lower quadrant abdominal pain that began suddenly 2 days ago but is significantly better today, noting of a previous medical history of diverticulosis as well as a cystic ovarian mass that has recently been noted to have mildly increased in size, having been present for the last 10 years.     On physical exam, patient is in no acute distress, afebrile, alert and orient x 4, speaking in full sentences, nontachypneic, nontachycardic.  Well-appearing female.  Patient has notable left lower quadrant abdominal tenderness to palpation, without any sign of ecchymosis, hernia.  Bowel sounds are present and normal in all 4 quadrants.  Exam is otherwise unremarkable.  With patient already scheduled for repeat CT, will follow suit and repeat CT imaging today as well as get baseline labs.  Labs were unremarkable, CT imaging shows stable findings of diverticulosis as well as cystic ovarian mass to left ovary.  Did note a new finding of gallstone which was alerted to the patient.  Will have her continue to follow-up with OB/GYN for further evaluation of the cystic mass and return to the ED for any new or worsening symptoms.  Otherwise we will have the patient continue to use symptomatic management at home for what appears to be most likely a muscle strain.   Patient vital signs have remained stable throughout the course of patient's time in the ED. Low suspicion for any other emergent pathology at this time. I believe this patient is safe to be discharged. Provided strict return to ER precautions. Patient expressed  agreement and understanding of plan. All questions were answered.   Differential diagnoses prior to evaluation: The emergent differential diagnosis includes, but is not limited to,  Hernia, diverticulitis, pancreatitis, UTI, nephrolithiasis, pyelonephritis, ovarian cyst, ovarian torsion, constipation, ischemic colitis, muscle strain. . This is not an exhaustive differential.   Past Medical History / Co-morbidities / Social History: Paroxysmal SVT, HTN, cancer, arthritis, diverticulosis, ischemic colitis, vestibular schwannoma, status post hysterectomy/appendectomy  Additional history: Chart reviewed. Pertinent results include:   Last saw OB/GYN on 08/02/2023 for pelvic mass.  Noted to have an ovarian mass that noted  to have some slight interval enlargement.  Asymptomatic at that time.  Noted to have a close follow-up visit in 6 months and CT in 1 year.  Noted to have been a cystic collection been present for over 10 years without any sign or evidence of metastatic disease.  Lab Tests/Imaging studies: I personally interpreted labs/imaging and the pertinent results include:  CBC unremarkable CMP unremarkable UA unremarkable Lipase unremarkable  CT findings show large but otherwise stable cystic adnexal mass on left ovary, extensive diverticulosis without diverticulitis as well as a large solitary gallstone.  I agree with the radiologist interpretation.  Medications: I have reviewed the patients home medicines and have made adjustments as needed.  Critical Interventions: none  Social Determinants of Health: none  Disposition: After consideration of the diagnostic results and the patients response to treatment, I feel that the patient would benefit from discharge and treatment as above.   emergency department workup does not suggest an emergent condition requiring admission or immediate intervention beyond what has been performed at this time. The plan is: Follow-up with OB/GYN, return  for new or worsening symptoms. The patient is safe for discharge and has been instructed to return immediately for worsening symptoms, change in symptoms or any other concerns.   Final Clinical Impression(s) / ED Diagnoses Final diagnoses:  LLQ abdominal pain    Rx / DC Orders ED Discharge Orders     None         Vevelyn Gowers 08/11/23 1259    Carin [place], MD 08/11/23 1458

## 2023-08-11 NOTE — ED Triage Notes (Signed)
 In for eval LLQ abd pain onset Monday evening. Felt like she twisted something or popped something. Nausea. Denies dysuria, vomiting or diarrhea.

## 2023-08-11 NOTE — Discharge Instructions (Signed)
 You were seen today for left lower quadrant abdominal pain.  Suspect this is most likely a muscle strain as your labs and imaging today were reassuring that I have low suspicion for any emergent causes of your symptoms today.  Recommend continued follow-up with OB/GYN for further management of the ovarian cyst.  As well as continue to follow-up with PCP for the gallstone finding noted today on your CT.  Please return to the ED for any new or worsening symptoms which would include fever, vomiting, worsening/uncontrollable pain, blood in stool or urine.

## 2023-08-11 NOTE — ED Notes (Signed)
 Pt aware of the need for a urine... Unable to currently provide the sample.Marland KitchenMarland Kitchen

## 2023-08-20 ENCOUNTER — Other Ambulatory Visit: Payer: Self-pay | Admitting: Family Medicine

## 2023-10-20 ENCOUNTER — Other Ambulatory Visit: Payer: Self-pay | Admitting: Family Medicine

## 2023-10-21 NOTE — Telephone Encounter (Signed)
 Requested medications are due for refill today.  yes  Requested medications are on the active medications list.  yes  Last refill. 07/22/2023 #90 0 rf  Future visit scheduled.   no  Notes to clinic.  Pt last seen 03/2023 pt is due for CPE.     Requested Prescriptions  Pending Prescriptions Disp Refills   atenolol  (TENORMIN ) 50 MG tablet [Pharmacy Med Name: ATENOLOL  50MG  TABLET] 90 tablet 0    Sig: TAKE ONE TABLET BY MOUTH ONCE A DAY (PLEASE KEEP ANNUAL VISIT FOR FUTURE REFILLS)     Cardiovascular: Beta Blockers 2 Failed - 10/21/2023  3:39 PM      Failed - Valid encounter within last 6 months    Recent Outpatient Visits           7 months ago Chronic idiopathic constipation   [place] [facility] Duanne Butler DASEN, MD   9 months ago Fecal soiling due to fecal incontinence   [place] [facility] Duanne, Butler DASEN, MD   1 year ago Diarrhea of infectious origin   [place] [facility] Pickard, Butler DASEN, MD   1 year ago Essential hypertension, benign   [place] [facility] Duanne, Butler DASEN, MD   1 year ago Essential hypertension, benign   [place] [facility] Pickard, Butler DASEN, MD              Passed - Cr in normal range and within 360 days    Creatinine  Date Value Ref Range Status  03/10/2016 0.8 0.6 - 1.1 mg/dL Final   Creat  Date Value Ref Range Status  07/16/2022 0.81 0.60 - 0.95 mg/dL Final   Creatinine, Ser  Date Value Ref Range Status  08/11/2023 0.86 0.44 - 1.00 mg/dL Final         Passed - Last BP in normal range    BP Readings from Last 1 Encounters:  08/11/23 (!) 137/56         Passed - Last Heart Rate in normal range    Pulse Readings from Last 1 Encounters:  08/11/23 72

## 2023-10-28 DIAGNOSIS — H40153 Residual stage of open-angle glaucoma, bilateral: Secondary | ICD-10-CM | POA: Diagnosis not present

## 2023-11-08 ENCOUNTER — Other Ambulatory Visit: Payer: Self-pay | Admitting: Family Medicine

## 2023-11-08 ENCOUNTER — Telehealth: Payer: Self-pay

## 2023-11-08 NOTE — Telephone Encounter (Signed)
 Copied from CRM [id number]. Topic: Clinical - Prescription Issue >> Nov 08, 2023  3:59 PM Emylou G wrote: Reason for CRM: Pharmacy called.. said the instructions changed from 2 at night to one.. is this accurate?  Pls contact them.

## 2023-11-09 NOTE — Telephone Encounter (Signed)
 I informed [place] pharmacy of Dr Breck response.

## 2023-12-02 ENCOUNTER — Other Ambulatory Visit: Payer: Self-pay | Admitting: Family Medicine

## 2024-01-03 ENCOUNTER — Other Ambulatory Visit: Payer: Self-pay | Admitting: Family Medicine

## 2024-01-17 ENCOUNTER — Other Ambulatory Visit: Payer: Self-pay | Admitting: Family Medicine

## 2024-01-17 DIAGNOSIS — R159 Full incontinence of feces: Secondary | ICD-10-CM

## 2024-01-17 DIAGNOSIS — K5904 Chronic idiopathic constipation: Secondary | ICD-10-CM

## 2024-01-19 NOTE — Telephone Encounter (Signed)
 Requested Prescriptions  Pending Prescriptions Disp Refills   atenolol  (TENORMIN ) 50 MG tablet [Pharmacy Med Name: ATENOLOL  50MG  TABLET] 30 tablet 0    Sig: TAKE ONE TABLET BY MOUTH ONCE A DAY (PLEASE KEEP ANNUAL VISIT FOR FUTURE REFILLS)     Cardiovascular: Beta Blockers 2 Failed - 01/19/2024  1:52 PM      Failed - Valid encounter within last 6 months    Recent Outpatient Visits           10 months ago Chronic idiopathic constipation   [place] [facility] Pickard, Butler DASEN, MD   1 year ago Fecal soiling due to fecal incontinence   [place] [facility] Duanne, Butler DASEN, MD   1 year ago Diarrhea of infectious origin   [place] [facility] Duanne, Butler DASEN, MD   1 year ago Essential hypertension, benign   [place] [facility] Duanne, Butler DASEN, MD   1 year ago Essential hypertension, benign   [place] [facility] Duanne Butler DASEN, MD              Passed - Cr in normal range and within 360 days    Creatinine  Date Value Ref Range Status  03/10/2016 0.8 0.6 - 1.1 mg/dL Final   Creat  Date Value Ref Range Status  07/16/2022 0.81 0.60 - 0.95 mg/dL Final   Creatinine, Ser  Date Value Ref Range Status  08/11/2023 0.86 0.44 - 1.00 mg/dL Final         Passed - Last BP in normal range    BP Readings from Last 1 Encounters:  08/11/23 (!) 137/56         Passed - Last Heart Rate in normal range    Pulse Readings from Last 1 Encounters:  08/11/23 72          linaclotide  (LINZESS ) 290 MCG CAPS capsule [Pharmacy Med Name: LINZESS  CAPSULE] 30 capsule 0    Sig: TAKE 1 CAPSULE (290 MCG TOTAL) BY MOUTH DAILY BEFORE BREAKFAST.     Gastroenterology: Irritable Bowel Syndrome Passed - 01/19/2024  1:52 PM      Passed - Valid encounter within last 12 months    Recent Outpatient Visits           10 months ago Chronic idiopathic constipation   [place] [facility] Pickard, Butler DASEN, MD   1 year ago Fecal soiling due to fecal incontinence   [place] [facility] Duanne, Butler DASEN, MD   1 year ago Diarrhea of infectious origin   [place] [facility] Pickard, Butler DASEN, MD   1 year ago Essential hypertension, benign   [place] [facility] Duanne, Butler DASEN, MD   1 year ago Essential hypertension, benign   [place] [facility] Pickard, Butler DASEN, MD              Refused Prescriptions Disp Refills   cloNIDine  (CATAPRES ) 0.1 MG tablet [Pharmacy Med Name: CLONIDINE  HYDROCHLORIDE 0.1MG  TABLET] 270 tablet 0    Sig: TAKE ONE TABLET BY MOUTH IN THE MORNING AND 1 TABLET BY MOUTH AT BEDTIME     Cardiovascular:  Alpha-2 Agonists Failed - 01/19/2024  1:52 PM      Failed - Valid encounter within last 6 months    Recent Outpatient Visits  10 months ago Chronic idiopathic constipation   [place] [facility] Pickard, Butler DASEN, MD   1 year ago Fecal soiling due to fecal incontinence   [place] [facility] Duanne, Butler DASEN, MD   1 year ago Diarrhea of infectious origin   [place] [facility] Duanne, Butler DASEN, MD   1 year ago Essential hypertension, benign   [place] [facility] Duanne, Butler DASEN, MD   1 year ago Essential hypertension, benign   [place] [facility] Pickard, Butler DASEN, MD              Passed - Last BP in normal range    BP Readings from Last 1 Encounters:  08/11/23 (!) 137/56         Passed - Last Heart Rate in normal range    Pulse Readings from Last 1 Encounters:  08/11/23 72

## 2024-01-31 ENCOUNTER — Other Ambulatory Visit: Payer: Self-pay | Admitting: Family Medicine

## 2024-01-31 ENCOUNTER — Encounter: Payer: Self-pay | Admitting: Family Medicine

## 2024-01-31 ENCOUNTER — Ambulatory Visit: Admitting: Family Medicine

## 2024-01-31 VITALS — BP 140/70 | HR 70 | Temp 97.7°F | Ht 63.0 in | Wt 153.4 lb

## 2024-01-31 DIAGNOSIS — Z23 Encounter for immunization: Secondary | ICD-10-CM

## 2024-01-31 DIAGNOSIS — I10 Essential (primary) hypertension: Secondary | ICD-10-CM

## 2024-01-31 DIAGNOSIS — K5904 Chronic idiopathic constipation: Secondary | ICD-10-CM

## 2024-01-31 MED ORDER — DOXAZOSIN MESYLATE 2 MG PO TABS: 2.0000 mg | ORAL_TABLET | Freq: Every day | ORAL | 2 refills | Status: AC

## 2024-01-31 MED ORDER — LINACLOTIDE 145 MCG PO CAPS: 145.0000 ug | ORAL_CAPSULE | Freq: Every day | ORAL | 3 refills | Status: AC

## 2024-01-31 NOTE — Progress Notes (Signed)
 Subjective:    Patient ID: Debra [name], female    DOB: 1935-02-07, 89 y.o.   MRN: [id number] Patient has a history of chronic idiopathic constipation.  She is currently taking 290 mcg daily of Linzess .  However she is skipping days.  She will take Linzess  on Monday.  This will cause intestinal cramps.  She will also take Senokot.  She will repeat the process on Tuesday at which point she will have a blowout.  She will then stop the medication and not resume it again until Thursday or Friday and the process will repeat.  She also complains of fatigue and sleepiness after she takes her blood pressure medication in the morning.  She is taking clonidine  twice daily  Past Medical History:  Diagnosis Date   Arthritis    Phreesia 07/02/2020   Brain mass    monitor by Dr Malcolm - right posterior fossa meningioma - thinks it's benign per patient   Cancer (HCC)    hand, face - spots removed   Constipation    Degeneration of lumbar or lumbosacral intervertebral disc    arthritis in back, hips and hands - otc med prn   Diarrhea    Diverticulosis of colon with hemorrhage    Dysrhythmia    Hx irregular heart beat - 15 yrs ago   Female stress incontinence    Glaucoma    Phreesia 07/02/2020   Headache    otc med prn   Hypertension    Lumbago    Missed abortion    x 2 - no surgery required   Paroxysmal supraventricular tachycardia    Hx   PONV (postoperative nausea and vomiting)    Pure hypercholesterolemia    SVD (spontaneous vaginal delivery)    x 4   Unspecified glaucoma(365.9)    bilateral   Past Surgical History:  Procedure Laterality Date   ABDOMINAL HYSTERECTOMY     APPENDECTOMY     COLONOSCOPY     EYE SURGERY     bilateral cataract eye surgery   EYE SURGERY     laser to relieve eye pressure - bilateral   LAPAROTOMY N/A 02/06/2014   Procedure: EXPLORATORY LAPAROTOMY;  Surgeon: Elveria Mungo, MD;  Location: [facility];  Service: Gynecology;  Laterality: N/A;    MULTIPLE TOOTH EXTRACTIONS     upper   right hand surgery     ganglion cyst removed   TUBAL LIGATION     Current Outpatient Medications on File Prior to Visit  Medication Sig Dispense Refill   acetaminophen  (TYLENOL ) 325 MG tablet Take 650 mg by mouth every 6 (six) hours as needed.     amLODipine  (NORVASC ) 10 MG tablet TAKE ONE TABLET (10 MG) BY MOUTH DAILY. (NEED APPT FOR FURTHER REFILLS) 30 tablet 0   atenolol  (TENORMIN ) 50 MG tablet TAKE ONE TABLET BY MOUTH ONCE A DAY (PLEASE KEEP ANNUAL VISIT FOR FUTURE REFILLS) 30 tablet 0   brimonidine  (ALPHAGAN ) 0.2 % ophthalmic solution Place 1 drop into both eyes 2 (two) times daily.     cholecalciferol (VITAMIN D3) 25 MCG (1000 UNIT) tablet Take 1,000 Units by mouth daily.     latanoprost  (XALATAN ) 0.005 % ophthalmic solution Place 1 drop into both eyes at bedtime.     lisinopril  (ZESTRIL ) 40 MG tablet TAKE ONE TABLET BY MOUTH DAILY 90 tablet 3   pravastatin  (PRAVACHOL ) 80 MG tablet TAKE ONE TABLET BY MOUTH ONCE A DAY (PLEASE KEEP ANNUAL VISIT FOR FUTURE REFILLS) 90 tablet 0  senna (SENOKOT) 8.6 MG tablet Take 2 tablets by mouth daily. (Patient taking differently: Take 2 tablets by mouth daily. Patient only taking Mondays and Thursday.)     timolol  (TIMOPTIC ) 0.5 % ophthalmic solution Place 1 drop into both eyes 2 (two) times daily.     traMADol  (ULTRAM ) 50 MG tablet Take 50 mg by mouth every 6 (six) hours as needed.     No current facility-administered medications on file prior to visit.   Allergies  Allergen Reactions   Codeine Phosphate Nausea And Vomiting    REACTION: unspecified   Lipitor [Atorvastatin ]    Pneumococcal Vaccines Hives   Sulfa Antibiotics     unknown   Latex Hives, Itching and Rash   Social History   Socioeconomic History   Marital status: Widowed    Spouse name: Not on file   Number of children: 4   Years of education: Not on file   Highest education level: Not on file  Occupational History   Occupation: raised  tobacco    Employer: Retired  Tobacco Use   Smoking status: Never   Smokeless tobacco: Never  Vaping Use   Vaping status: Never Used  Substance and Sexual Activity   Alcohol use: No   Drug use: No   Sexual activity: Not Currently    Birth control/protection: None, Post-menopausal, Surgical  Other Topics Concern   Not on file  Social History Narrative    Has living will.  Daughter is HCPOA, Lamarr Peach.Full code.   3 sons and 1 daughter.   8 grandchildren   5 great grandchildren   Social Drivers of Corporate Investment Banker Strain: Low Risk  (05/14/2022)   Overall Financial Resource Strain (CARDIA)    Difficulty of Paying Living Expenses: Not hard at all  Food Insecurity: No Food Insecurity (05/14/2022)   Hunger Vital Sign    Worried About Running Out of Food in the Last Year: Never true    Ran Out of Food in the Last Year: Never true  Transportation Needs: No Transportation Needs (05/14/2022)   PRAPARE - Administrator, Civil Service (Medical): No    Lack of Transportation (Non-Medical): No  Physical Activity: Insufficiently Active (05/14/2022)   Exercise Vital Sign    Days of Exercise per Week: 3 days    Minutes of Exercise per Session: 30 min  Stress: No Stress Concern Present (05/14/2022)   [employer] of Occupational Health - Occupational Stress Questionnaire    Feeling of Stress : Not at all  Social Connections: Moderately Integrated (05/14/2022)   Social Connection and Isolation Panel    Frequency of Communication with Friends and Family: More than three times a week    Frequency of Social Gatherings with Friends and Family: Three times a week    Attends Religious Services: More than 4 times per year    Active Member of Clubs or Organizations: Yes    Attends Banker Meetings: More than 4 times per year    Marital Status: Widowed  Intimate Partner Violence: Not At Risk (05/14/2022)   Humiliation, Afraid, Rape, and Kick questionnaire     Fear of Current or Ex-Partner: No    Emotionally Abused: No    Physically Abused: No    Sexually Abused: No     Review of Systems     Objective:   Physical Exam Vitals reviewed.  Constitutional:      Appearance: Normal appearance.  Cardiovascular:     Rate and  Rhythm: Normal rate and regular rhythm.     Heart sounds: Normal heart sounds. No murmur heard.    No friction rub. No gallop.  Pulmonary:     Effort: Pulmonary effort is normal.     Breath sounds: Normal breath sounds.  Abdominal:     General: Bowel sounds are normal. There is no distension.     Palpations: Abdomen is soft.     Tenderness: There is no abdominal tenderness. There is no guarding or rebound.  Genitourinary:    Rectum: Guaiac result negative. No mass or tenderness. Normal anal tone.  Musculoskeletal:     Right lower leg: No edema.     Left lower leg: No edema.  Neurological:     Mental Status: She is alert.           Assessment & Plan:  Essential hypertension, benign - Plan: CBC with Differential/Platelet, Comprehensive metabolic panel with GFR  Flu vaccine need - Plan: Flu vaccine HIGH DOSE PF(Fluzone  Trivalent)  Chronic idiopathic constipation   Recommended we reduce her dose of stool softeners and stimulant laxatives.  I recommended she take Linzess  145 mcg p.o. daily consistently and discontinue Senokot.  Hopefully having small bowel movements of daily rather than having blowouts 1 or 2 times a week will decrease some of her intestinal cramping.  I believe that the hypersomnolence is related to clonidine .  We will discontinue clonidine  and replace that with doxazosin 2 mg daily.  Check CBC and CMP

## 2024-02-01 ENCOUNTER — Ambulatory Visit: Payer: Self-pay | Admitting: Family Medicine

## 2024-02-01 LAB — CBC WITH DIFFERENTIAL/PLATELET
Absolute Lymphocytes: 1151 {cells}/uL (ref 850–3900)
Absolute Monocytes: 281 {cells}/uL (ref 200–950)
Basophils Absolute: 39 {cells}/uL (ref 0–200)
Basophils Relative: 1 %
Eosinophils Absolute: 148 {cells}/uL (ref 15–500)
Eosinophils Relative: 3.8 %
HCT: 41.7 % (ref 35.9–46.0)
Hemoglobin: 14 g/dL (ref 11.7–15.5)
MCH: 29.5 pg (ref 27.0–33.0)
MCHC: 33.6 g/dL (ref 31.6–35.4)
MCV: 87.8 fL (ref 81.4–101.7)
MPV: 9.2 fL (ref 7.5–12.5)
Monocytes Relative: 7.2 %
Neutro Abs: 2282 {cells}/uL (ref 1500–7800)
Neutrophils Relative %: 58.5 %
Platelets: 195 Thousand/uL (ref 140–400)
RBC: 4.75 Million/uL (ref 3.80–5.10)
RDW: 12.7 % (ref 11.0–15.0)
Total Lymphocyte: 29.5 %
WBC: 3.9 Thousand/uL (ref 3.8–10.8)

## 2024-02-01 LAB — COMPREHENSIVE METABOLIC PANEL WITH GFR
AG Ratio: 1.8 (calc) (ref 1.0–2.5)
ALT: 6 U/L (ref 6–29)
AST: 12 U/L (ref 10–35)
Albumin: 4.4 g/dL (ref 3.6–5.1)
Alkaline phosphatase (APISO): 68 U/L (ref 37–153)
BUN/Creatinine Ratio: 16 (calc) (ref 6–22)
BUN: 16 mg/dL (ref 7–25)
CO2: 26 mmol/L (ref 20–32)
Calcium: 9.5 mg/dL (ref 8.6–10.4)
Chloride: 106 mmol/L (ref 98–110)
Creat: 1 mg/dL — ABNORMAL HIGH (ref 0.60–0.95)
Globulin: 2.4 g/dL (ref 1.9–3.7)
Glucose, Bld: 89 mg/dL (ref 65–99)
Potassium: 4.7 mmol/L (ref 3.5–5.3)
Sodium: 140 mmol/L (ref 135–146)
Total Bilirubin: 0.7 mg/dL (ref 0.2–1.2)
Total Protein: 6.8 g/dL (ref 6.1–8.1)
eGFR: 55 mL/min/1.73m2 — ABNORMAL LOW (ref >=60)

## 2024-02-07 ENCOUNTER — Telehealth: Payer: Self-pay | Admitting: *Deleted

## 2024-02-07 ENCOUNTER — Ambulatory Visit: Admitting: Psychiatry

## 2024-02-07 DIAGNOSIS — R1900 Intra-abdominal and pelvic swelling, mass and lump, unspecified site: Secondary | ICD-10-CM

## 2024-02-07 NOTE — Telephone Encounter (Signed)
 Spoke with the patient's daughter and canceled appt for today. Patient is sick. The daughter stated that she will call back to reschedule appt once she has the patient's schedule for next year.

## 2024-02-07 NOTE — Progress Notes (Unsigned)
 This encounter was created in error - please disregard.

## 2024-02-16 ENCOUNTER — Other Ambulatory Visit: Payer: Self-pay | Admitting: Family Medicine

## 2024-02-29 ENCOUNTER — Other Ambulatory Visit: Payer: Self-pay | Admitting: Family Medicine

## 2024-03-14 ENCOUNTER — Other Ambulatory Visit: Payer: Self-pay | Admitting: Family Medicine

## 2024-03-30 ENCOUNTER — Encounter (HOSPITAL_COMMUNITY): Payer: Self-pay | Admitting: *Deleted

## 2024-03-30 ENCOUNTER — Other Ambulatory Visit: Payer: Self-pay

## 2024-03-30 ENCOUNTER — Ambulatory Visit (HOSPITAL_COMMUNITY)
Admission: EM | Admit: 2024-03-30 | Discharge: 2024-03-30 | Disposition: A | Discharge status: Home / Self Care | Attending: Family Medicine | Admitting: Family Medicine

## 2024-03-30 ENCOUNTER — Ambulatory Visit (INDEPENDENT_AMBULATORY_CARE_PROVIDER_SITE_OTHER)

## 2024-03-30 DIAGNOSIS — M5441 Lumbago with sciatica, right side: Secondary | ICD-10-CM

## 2024-03-30 MED ORDER — PREDNISONE 10 MG (21) PO TBPK: ORAL_TABLET | Freq: Every day | ORAL | 0 refills | Status: AC

## 2024-03-30 MED ORDER — HYDROCODONE-ACETAMINOPHEN 5-325 MG PO TABS: 0.5000 | ORAL_TABLET | Freq: Four times a day (QID) | ORAL | 0 refills | Status: AC | PRN

## 2024-03-30 NOTE — ED Provider Notes (Signed)
 " [facility]   [id number] 03/30/24 Arrival Time: 9060  ASSESSMENT & PLAN:  1. Acute right-sided low back pain with right-sided sciatica    Able to ambulate here and hemodynamically stable. I have personally viewed and independently interpreted the imaging studies ordered this visit. L-spine: no acute bony abnormalities noted. Agree with radiology report.  Trial of: Meds ordered this encounter  Medications   predniSONE  (STERAPRED UNI-PAK 21 TAB) 10 MG (21) TBPK tablet    Sig: Take by mouth daily. Take as directed.    Dispense:  21 tablet    Refill:  0   HYDROcodone -acetaminophen  (NORCO/VICODIN) 5-325 MG tablet    Sig: Take 0.5-1 tablets by mouth every 6 (six) hours as needed for moderate pain (pain score 4-6) or severe pain (pain score 7-10).    Dispense:  10 tablet    Refill:  0     Discharge Instructions      HOME CARE INSTRUCTIONS: For many people, back pain returns. Since low back pain is rarely dangerous, it is often a condition that people can learn to manage on their own. Please remain active. It is stressful on the back to sit or stand in one place. Do not sit, drive, or stand in one place for more than 30 minutes at a time. Take short walks on level surfaces as soon as pain allows. Try to increase the length of time you walk each day. Do not stay in bed. Resting more than 1 or 2 days can delay your recovery. Do not avoid exercise or work. Your body is made to move. It is not dangerous to be active, even though your back may hurt. Your back will likely heal faster if you return to being active before your pain is gone. Over-the-counter medicines to reduce pain and inflammation are often the most helpful.  SEEK MEDICAL CARE IF: You have pain that is not relieved with rest or medicine. You have pain that does not improve in 1 week. You have new symptoms. You are generally not feeling well.  SEEK IMMEDIATE MEDICAL CARE IF: You have pain that radiates from your  back into your legs. You develop new bowel or bladder control problems. You have unusual weakness or numbness in your arms or legs. You develop nausea or vomiting. You develop abdominal pain. You feel faint.  Be aware, you have been prescribed pain medications that may cause drowsiness. While taking this medication, do not take any other medications containing acetaminophen  (Tylenol ). Do not take this medication with your Tramadol . Do not combine with alcohol or recreational drugs. Please do not drive, operate heavy machinery, or take part in activities that require making important decisions while on this medication as your judgement may be clouded.       Reviewed expectations re: course of current medical issues. Questions answered. Outlined signs and symptoms indicating need for more acute intervention. Patient verbalized understanding. After Visit Summary given.   SUBJECTIVE: History from: patient and daughter.  Debra [name] is a 89 y.o. female who presents with complaint of R sided low back pain; x 3 weeks; with radiation down R leg at times; affecting sleep. Denies changes in bowel/bladder habits. Denies extremity sensation changes or weakness.  OTC analgesics without relief.   OBJECTIVE:  Vitals:   03/30/24 1110  BP: (!) 164/80  Pulse: 63  Resp: 20  Temp: 97.8 F (36.6 C)  SpO2: 97%    General appearance: alert; no distress HEENT: [place]; AT Neck: supple with  FROM; without midline tenderness Abdomen: soft, non-tender; non-distended Back: no specific TTP Extremities: symmetrical without gross deformities; normal ROM of bilateral LE Skin: warm and dry Neurologic: normal gait; normal sensation and strength of bilateral LE Psychological: alert and cooperative; normal mood and affect  Labs:  Labs Reviewed - No data to display  Imaging: No results found.  Allergies[1]  Past Medical History:  Diagnosis Date   Arthritis    Phreesia 07/02/2020   Brain  mass    monitor by Dr Malcolm - right posterior fossa meningioma - thinks it's benign per patient   Cancer (HCC)    hand, face - spots removed   Constipation    Degeneration of lumbar or lumbosacral intervertebral disc    arthritis in back, hips and hands - otc med prn   Diarrhea    Diverticulosis of colon with hemorrhage    Dysrhythmia    Hx irregular heart beat - 15 yrs ago   Female stress incontinence    Glaucoma    Phreesia 07/02/2020   Headache    otc med prn   Hypertension    Lumbago    Missed abortion    x 2 - no surgery required   Paroxysmal supraventricular tachycardia    Hx   PONV (postoperative nausea and vomiting)    Pure hypercholesterolemia    SVD (spontaneous vaginal delivery)    x 4   Unspecified glaucoma(365.9)    bilateral   Social History   Socioeconomic History   Marital status: Widowed    Spouse name: Not on file   Number of children: 4   Years of education: Not on file   Highest education level: Not on file  Occupational History   Occupation: raised tobacco    Employer: Retired  Tobacco Use   Smoking status: Never   Smokeless tobacco: Never  Vaping Use   Vaping status: Never Used  Substance and Sexual Activity   Alcohol use: No   Drug use: No   Sexual activity: Not Currently    Birth control/protection: None, Post-menopausal, Surgical  Other Topics Concern   Not on file  Social History Narrative    Has living will.  Daughter is HCPOA, Lamarr Peach.Full code.   3 sons and 1 daughter.   8 grandchildren   5 great grandchildren   Social Drivers of Health   Tobacco Use: Low Risk (03/30/2024)   Patient History    Smoking Tobacco Use: Never    Smokeless Tobacco Use: Never    Passive Exposure: Not on file  Financial Resource Strain: Low Risk (05/14/2022)   Overall Financial Resource Strain (CARDIA)    Difficulty of Paying Living Expenses: Not hard at all  Food Insecurity: No Food Insecurity (05/14/2022)   Hunger Vital Sign    Worried  About Running Out of Food in the Last Year: Never true    Ran Out of Food in the Last Year: Never true  Transportation Needs: No Transportation Needs (05/14/2022)   PRAPARE - Administrator, Civil Service (Medical): No    Lack of Transportation (Non-Medical): No  Physical Activity: Insufficiently Active (05/14/2022)   Exercise Vital Sign    Days of Exercise per Week: 3 days    Minutes of Exercise per Session: 30 min  Stress: No Stress Concern Present (05/14/2022)   [employer] of Occupational Health - Occupational Stress Questionnaire    Feeling of Stress : Not at all  Social Connections: Moderately Integrated (05/14/2022)   Social Connection  and Isolation Panel    Frequency of Communication with Friends and Family: More than three times a week    Frequency of Social Gatherings with Friends and Family: Three times a week    Attends Religious Services: More than 4 times per year    Active Member of Clubs or Organizations: Yes    Attends Banker Meetings: More than 4 times per year    Marital Status: Widowed  Intimate Partner Violence: Not At Risk (05/14/2022)   Humiliation, Afraid, Rape, and Kick questionnaire    Fear of Current or Ex-Partner: No    Emotionally Abused: No    Physically Abused: No    Sexually Abused: No  Depression (PHQ2-9): Low Risk (01/31/2024)   Depression (PHQ2-9)    PHQ-2 Score: 0  Alcohol Screen: Low Risk (05/14/2022)   Alcohol Screen    Last Alcohol Screening Score (AUDIT): 0  Housing: Low Risk (05/14/2022)   Housing    Last Housing Risk Score: 0  Utilities: Not At Risk (05/14/2022)   AHC Utilities    Threatened with loss of utilities: No  Health Literacy: Not on file   Family History  Problem Relation Age of Onset   Aneurysm Mother    Alzheimer's disease Mother    Prostate cancer Father    Cancer Sister        unknown kind, metastatic   Arthritis Sister    Prostate cancer Brother    Heart disease Brother    Hypertension  Brother    Glaucoma Brother    Prostate cancer Brother    Diabetes Brother    Heart disease Brother    Glaucoma Brother    Breast cancer Paternal Aunt    Colon cancer Neg Hx    Ovarian cancer Neg Hx    Endometrial cancer Neg Hx    Pancreatic cancer Neg Hx    Past Surgical History:  Procedure Laterality Date   ABDOMINAL HYSTERECTOMY     APPENDECTOMY     COLONOSCOPY     EYE SURGERY     bilateral cataract eye surgery   EYE SURGERY     laser to relieve eye pressure - bilateral   LAPAROTOMY N/A 02/06/2014   Procedure: EXPLORATORY LAPAROTOMY;  Surgeon: Elveria Mungo, MD;  Location: [facility];  Service: Gynecology;  Laterality: N/A;   MULTIPLE TOOTH EXTRACTIONS     upper   right hand surgery     ganglion cyst removed   TUBAL LIGATION         [1]  Allergies Allergen Reactions   Codeine Phosphate Nausea And Vomiting    REACTION: unspecified   Lipitor [Atorvastatin ]    Pneumococcal Vaccines Hives   Sulfa Antibiotics     unknown   Latex Hives, Itching and Rash     Rolinda Rogue, MD 03/30/24 1303  "

## 2024-03-30 NOTE — ED Triage Notes (Signed)
 Pt reports back pain that radiates into her RT leg to knee. PT denies any injuries . PT took OTC . Pt took some old  prednisone  . PT reports pain worse at night.

## 2024-03-30 NOTE — Discharge Instructions (Addendum)
 HOME CARE INSTRUCTIONS: For many people, back pain returns. Since low back pain is rarely dangerous, it is often a condition that people can learn to manage on their own. Please remain active. It is stressful on the back to sit or stand in one place. Do not sit, drive, or stand in one place for more than 30 minutes at a time. Take short walks on level surfaces as soon as pain allows. Try to increase the length of time you walk each day. Do not stay in bed. Resting more than 1 or 2 days can delay your recovery. Do not avoid exercise or work. Your body is made to move. It is not dangerous to be active, even though your back may hurt. Your back will likely heal faster if you return to being active before your pain is gone. Over-the-counter medicines to reduce pain and inflammation are often the most helpful.  SEEK MEDICAL CARE IF: You have pain that is not relieved with rest or medicine. You have pain that does not improve in 1 week. You have new symptoms. You are generally not feeling well.  SEEK IMMEDIATE MEDICAL CARE IF: You have pain that radiates from your back into your legs. You develop new bowel or bladder control problems. You have unusual weakness or numbness in your arms or legs. You develop nausea or vomiting. You develop abdominal pain. You feel faint.  Be aware, you have been prescribed pain medications that may cause drowsiness. While taking this medication, do not take any other medications containing acetaminophen  (Tylenol ). Do not take this medication with your Tramadol . Do not combine with alcohol or recreational drugs. Please do not drive, operate heavy machinery, or take part in activities that require making important decisions while on this medication as your judgement may be clouded.

## 2024-04-06 ENCOUNTER — Ambulatory Visit: Payer: Self-pay

## 2024-04-06 ENCOUNTER — Encounter: Payer: Self-pay | Admitting: Family Medicine

## 2024-04-06 ENCOUNTER — Ambulatory Visit: Admitting: Family Medicine

## 2024-04-06 ENCOUNTER — Other Ambulatory Visit: Payer: Self-pay | Admitting: Family Medicine

## 2024-04-06 VITALS — BP 130/68 | HR 72 | Temp 97.6°F | Ht 63.0 in | Wt 153.0 lb

## 2024-04-06 DIAGNOSIS — M5416 Radiculopathy, lumbar region: Secondary | ICD-10-CM

## 2024-04-06 DIAGNOSIS — M51362 Other intervertebral disc degeneration, lumbar region with discogenic back pain and lower extremity pain: Secondary | ICD-10-CM

## 2024-04-06 MED ORDER — PREDNISONE 20 MG PO TABS: ORAL_TABLET | ORAL | 0 refills | Status: AC

## 2024-04-06 NOTE — Progress Notes (Signed)
 "  Subjective:    Patient ID: Debra [name], female    DOB: 08-Nov-1934, 89 y.o.   MRN: [id number]  Patient complains of pain in her lower back radiating into her right posterior hip for more than 3 weeks.  Patient went to an urgent care on January 29 and was given prednisone  for possible sciatica.  This helped temporarily but then the pain has returned.  An x-ray obtained at the urgent care shows significant degeneration of the disc between L4 and L5 with 5 mm of anterolisthesis of L4 on L5.  I suspect that this is the site of nerve impingement.  She also complains of right knee pain primarily underneath the kneecap and over the medial compartment.  It hurts to stand and her lower back.  It hurts to sit in her lower back.  She denies any saddle anesthesia or bowel or bladder incontinence.  She denies any leg weakness.  Patient then clarifies and states that the pain has been severe for the last 3 weeks but has been present radiating into her hip for a couple of months Past Medical History:  Diagnosis Date   Arthritis    [employer]rees[employer] 07/02/2020   Brain mass    monitor by Dr Malcolm - right posterior fossa meningioma - thinks it's benign per patient   Cancer (HCC)    hand, face - spots removed   Constipation    Degeneration of lumbar or lumbosacral intervertebral disc    arthritis in back, hips and hands - otc med prn   Diarrhea    Diverticulosis of colon with hemorrhage    Dysrhythmia    Hx irregular heart beat - 15 yrs ago   Female stress incontinence    Glaucoma    [employer]ia 07/02/2020   Headache    otc med prn   Hypertension    Lumbago    Missed abortion    x 2 - no surgery required   Paroxysmal supraventricular tachycardia    Hx   PONV (postoperative nausea and vomiting)    Pure hypercholesterolemia    SVD (spontaneous vaginal delivery)    x 4   Unspecified glaucoma(365.9)    bilateral   Past Surgical History:  Procedure Laterality Date   ABDOMINAL HYSTERECTOMY      APPENDECTOMY     COLONOSCOPY     EYE SURGERY     bilateral cataract eye surgery   EYE SURGERY     laser to relieve eye pressure - bilateral   LAPAROTOMY N/A 02/06/2014   Procedure: EXPLORATORY LAPAROTOMY;  Surgeon: Elveria Mungo, MD;  Location: [facility];  Service: Gynecology;  Laterality: N/A;   MULTIPLE TOOTH EXTRACTIONS     upper   right hand surgery     ganglion cyst removed   TUBAL LIGATION     Current Outpatient Medications on File Prior to Visit  Medication Sig Dispense Refill   atenolol  (TENORMIN ) 50 MG tablet TAKE ONE TABLET BY MOUTH ONCE A DAY (PLEASE KEEP ANNUAL VISIT FOR FUTURE REFILLS) 30 tablet 0   brimonidine  (ALPHAGAN ) 0.2 % ophthalmic solution Place 1 drop into both eyes 2 (two) times daily.     cholecalciferol (VITAMIN D3) 25 MCG (1000 UNIT) tablet Take 1,000 Units by mouth daily.     doxazosin  (CARDURA ) 2 MG tablet Take 1 tablet (2 mg total) by mouth daily. Stop clonidine  30 tablet 2   HYDROcodone -acetaminophen  (NORCO/VICODIN) 5-325 MG tablet Take 0.5-1 tablets by mouth every 6 (six) hours as needed for moderate  pain (pain score 4-6) or severe pain (pain score 7-10). 10 tablet 0   latanoprost  (XALATAN ) 0.005 % ophthalmic solution Place 1 drop into both eyes at bedtime.     linaclotide  (LINZESS ) 145 MCG CAPS capsule Take 1 capsule (145 mcg total) by mouth daily before breakfast. 90 capsule 3   lisinopril  (ZESTRIL ) 40 MG tablet TAKE ONE TABLET BY MOUTH DAILY 90 tablet 3   pravastatin  (PRAVACHOL ) 80 MG tablet TAKE ONE TABLET BY MOUTH ONCE A DAY (PLEASE KEEP ANNUAL VISIT FOR FUTURE REFILLS) 90 tablet 0   predniSONE  (STERAPRED UNI-PAK 21 TAB) 10 MG (21) TBPK tablet Take by mouth daily. Take as directed. 21 tablet 0   senna (SENOKOT) 8.6 MG tablet Take 2 tablets by mouth daily.     timolol  (TIMOPTIC ) 0.5 % ophthalmic solution Place 1 drop into both eyes 2 (two) times daily.     traMADol  (ULTRAM ) 50 MG tablet Take 50 mg by mouth every 6 (six) hours as needed.     No  current facility-administered medications on file prior to visit.   Allergies  Allergen Reactions   Codeine Phosphate Nausea And Vomiting    REACTION: unspecified   Lipitor [Atorvastatin ]    Pneumococcal Vaccines Hives   Sulfa Antibiotics     unknown   Latex Hives, Itching and Rash   Social History   Socioeconomic History   Marital status: Widowed    Spouse name: Not on file   Number of children: 4   Years of education: Not on file   Highest education level: Not on file  Occupational History   Occupation: raised tobacco    Employer: Retired  Tobacco Use   Smoking status: Never   Smokeless tobacco: Never  Vaping Use   Vaping status: Never Used  Substance and Sexual Activity   Alcohol use: No   Drug use: No   Sexual activity: Not Currently    Birth control/protection: None, Post-menopausal, Surgical  Other Topics Concern   Not on file  Social History Narrative    Has living will.  Daughter is HCPOA, Lamarr Peach.Full code.   3 sons and 1 daughter.   8 grandchildren   5 great grandchildren   Social Drivers of Health   Tobacco Use: Low Risk (03/30/2024)   Patient History    Smoking Tobacco Use: Never    Smokeless Tobacco Use: Never    Passive Exposure: Not on file  Financial Resource Strain: Low Risk (05/14/2022)   Overall Financial Resource Strain (CARDIA)    Difficulty of Paying Living Expenses: Not hard at all  Food Insecurity: No Food Insecurity (05/14/2022)   Hunger Vital Sign    Worried About Running Out of Food in the Last Year: Never true    Ran Out of Food in the Last Year: Never true  Transportation Needs: No Transportation Needs (05/14/2022)   PRAPARE - Administrator, Civil Service (Medical): No    Lack of Transportation (Non-Medical): No  Physical Activity: Insufficiently Active (05/14/2022)   Exercise Vital Sign    Days of Exercise per Week: 3 days    Minutes of Exercise per Session: 30 min  Stress: No Stress Concern Present  (05/14/2022)   [employer] of Occupational Health - Occupational Stress Questionnaire    Feeling of Stress : Not at all  Social Connections: Moderately Integrated (05/14/2022)   Social Connection and Isolation Panel    Frequency of Communication with Friends and Family: More than three times a week  Frequency of Social Gatherings with Friends and Family: Three times a week    Attends Religious Services: More than 4 times per year    Active Member of Clubs or Organizations: Yes    Attends Banker Meetings: More than 4 times per year    Marital Status: Widowed  Intimate Partner Violence: Not At Risk (05/14/2022)   Humiliation, Afraid, Rape, and Kick questionnaire    Fear of Current or Ex-Partner: No    Emotionally Abused: No    Physically Abused: No    Sexually Abused: No  Depression (PHQ2-9): Low Risk (04/06/2024)   Depression (PHQ2-9)    PHQ-2 Score: 0  Alcohol Screen: Low Risk (05/14/2022)   Alcohol Screen    Last Alcohol Screening Score (AUDIT): 0  Housing: Low Risk (05/14/2022)   Housing    Last Housing Risk Score: 0  Utilities: Not At Risk (05/14/2022)   AHC Utilities    Threatened with loss of utilities: No  Health Literacy: Not on file     Review of Systems     Objective:   Physical Exam Vitals reviewed.  Constitutional:      Appearance: Normal appearance.  Cardiovascular:     Rate and Rhythm: Normal rate and regular rhythm.     Heart sounds: Normal heart sounds. No murmur heard.    No friction rub. No gallop.  Pulmonary:     Effort: Pulmonary effort is normal.     Breath sounds: Normal breath sounds.  Abdominal:     General: Bowel sounds are normal. There is no distension.     Palpations: Abdomen is soft.     Tenderness: There is no abdominal tenderness. There is no guarding or rebound.  Musculoskeletal:     Lumbar back: Tenderness present. No spasms. Decreased range of motion.       Back:     Right lower leg: No edema.     Left lower leg:  No edema.  Neurological:     Mental Status: She is alert.           Assessment & Plan:  Lumbar radiculopathy - Plan: MR Lumbar Spine Wo Contrast  Degeneration of intervertebral disc of lumbar region with discogenic back pain and lower extremity pain - Plan: MR Lumbar Spine Wo Contrast I believe the patient's knee pain is due to osteoarthritis where she has been compensating for her lower back pain by walking with an antalgic gait.  However her low back pain is due to degenerative disc disease between L4 and L5 and she is having lumbar radiculopathy secondary to this.  The pain has been present for more than 2 months and has been severe and unbearable for the last 3 months.  I will try an additional round of prednisone  and order an MRI because I believe the patient would be a candidate for epidural steroid injections.  She has received these in the past and has significant benefit from them "

## 2024-04-06 NOTE — Telephone Encounter (Signed)
 FYI Only or Action Required?: FYI only for provider: appointment scheduled on 04/06/24.  Patient was last seen in primary care on 01/31/2024 by Duanne Butler DASEN, MD.  Called Nurse Triage reporting Hip Pain and Knee Pain.  Symptoms began 3 weeks ago.  Interventions attempted: OTC medications: Tylenol  Arthritis and Ice/heat application.  Symptoms are: unchanged.  Triage Disposition: See HCP Within 4 Hours (Or PCP Triage)  Patient/caregiver understands and will follow disposition?: Yes  Message from [place] sent at 04/06/2024  8:29 AM EST  Reason for Triage: Patient is having pain in her right hip and knee, went to UC last week was given prednisone  and oxycodone , felt better when she was taking the prednisone  but now that it is gone it is hurting pretty bad. (did xray in UC and didn't show anything)   Reason for Disposition  [1] SEVERE pain (e.g., excruciating, unable to do any normal activities) AND [2] not improved after 2 hours of pain medicine  Answer Assessment - Initial Assessment Questions Daughter called reporting that patient continues to have pain in hip and knee. Patient seen in UC last week for same.   1. LOCATION and RADIATION: Where is the pain located? Does the pain spread (shoot) anywhere else?     Right hip also right knee   3. SEVERITY: How bad is the pain? What does it keep you from doing?   (Scale 1-10; or mild, moderate, severe)     8/10 per daughter  4. ONSET: When did the pain start? Does it come and go, or is it there all the time?     3 weeks ago  6. CAUSE: What do you think is causing the hip pain?      Unsure  8. OTHER SYMPTOMS: Do you have any other symptoms? (e.g., back pain, pain shooting down leg,  fever, rash)     Denies fever or any recent injuries/falls  Protocols used: Hip Pain-A-AH

## 2024-04-25 ENCOUNTER — Other Ambulatory Visit
